# Patient Record
Sex: Female | Born: 1950 | Race: Black or African American | Hispanic: No | Smoking: Never smoker
Health system: Southern US, Community
[De-identification: ages and names within clinical notes are randomized; demographics above are authoritative.]

## PROBLEM LIST (undated history)

## (undated) ENCOUNTER — Emergency Department (HOSPITAL_BASED_OUTPATIENT_CLINIC_OR_DEPARTMENT_OTHER): Discharge status: Absent without leave | Payer: PRIVATE HEALTH INSURANCE

## (undated) DIAGNOSIS — I639 Cerebral infarction, unspecified: Secondary | ICD-10-CM

## (undated) DIAGNOSIS — H409 Unspecified glaucoma: Secondary | ICD-10-CM

## (undated) DIAGNOSIS — E079 Disorder of thyroid, unspecified: Secondary | ICD-10-CM

## (undated) HISTORY — DX: Cerebral infarction, unspecified: I63.9

## (undated) HISTORY — PX: CARPAL TUNNEL RELEASE: SHX101

## (undated) HISTORY — DX: Unspecified glaucoma: H40.9

---

## 2006-03-05 ENCOUNTER — Ambulatory Visit: Payer: Self-pay | Admitting: Hospitalist

## 2006-03-05 ENCOUNTER — Ambulatory Visit (HOSPITAL_COMMUNITY): Admission: RE | Admit: 2006-03-05 | Discharge: 2006-03-05 | Payer: Self-pay | Admitting: Hospitalist

## 2006-03-12 ENCOUNTER — Ambulatory Visit: Payer: Self-pay | Admitting: Internal Medicine

## 2006-04-03 ENCOUNTER — Ambulatory Visit (HOSPITAL_COMMUNITY): Admission: RE | Admit: 2006-04-03 | Discharge: 2006-04-03 | Payer: Self-pay | Admitting: Internal Medicine

## 2006-04-03 ENCOUNTER — Ambulatory Visit: Payer: Self-pay | Admitting: Internal Medicine

## 2006-04-07 DIAGNOSIS — K029 Dental caries, unspecified: Secondary | ICD-10-CM | POA: Insufficient documentation

## 2006-04-07 DIAGNOSIS — Z9079 Acquired absence of other genital organ(s): Secondary | ICD-10-CM | POA: Insufficient documentation

## 2006-04-07 DIAGNOSIS — Z9889 Other specified postprocedural states: Secondary | ICD-10-CM | POA: Insufficient documentation

## 2006-04-07 DIAGNOSIS — E039 Hypothyroidism, unspecified: Secondary | ICD-10-CM | POA: Insufficient documentation

## 2006-08-26 ENCOUNTER — Encounter: Payer: Self-pay | Admitting: Internal Medicine

## 2006-11-08 ENCOUNTER — Emergency Department (HOSPITAL_COMMUNITY): Admission: EM | Admit: 2006-11-08 | Discharge: 2006-11-08 | Payer: Self-pay | Admitting: Emergency Medicine

## 2009-10-01 ENCOUNTER — Emergency Department (HOSPITAL_COMMUNITY): Admission: EM | Admit: 2009-10-01 | Discharge: 2009-10-01 | Payer: Self-pay | Admitting: Family Medicine

## 2010-05-17 DEATH — deceased

## 2012-08-18 ENCOUNTER — Encounter (HOSPITAL_COMMUNITY): Payer: Self-pay | Admitting: Radiology

## 2012-08-18 ENCOUNTER — Emergency Department (HOSPITAL_COMMUNITY)
Admission: EM | Admit: 2012-08-18 | Discharge: 2012-08-18 | Disposition: A | Discharge status: Home / Self Care | Attending: Emergency Medicine | Admitting: Emergency Medicine

## 2012-08-18 DIAGNOSIS — E079 Disorder of thyroid, unspecified: Secondary | ICD-10-CM | POA: Insufficient documentation

## 2012-08-18 DIAGNOSIS — M79609 Pain in unspecified limb: Secondary | ICD-10-CM | POA: Insufficient documentation

## 2012-08-18 DIAGNOSIS — M79671 Pain in right foot: Secondary | ICD-10-CM

## 2012-08-18 HISTORY — DX: Disorder of thyroid, unspecified: E07.9

## 2012-08-18 MED ORDER — IBUPROFEN 400 MG PO TABS
600.0000 mg | ORAL_TABLET | Freq: Once | ORAL | Status: AC
  Administered 2012-08-18: 600 mg via ORAL
  Filled 2012-08-18: qty 2

## 2012-08-18 MED ORDER — IBUPROFEN 600 MG PO TABS
600.0000 mg | ORAL_TABLET | Freq: Four times a day (QID) | ORAL | Status: DC | PRN
Start: 2012-08-18 — End: 2017-03-13

## 2012-08-18 NOTE — ED Provider Notes (Signed)
Medical screening examination/treatment/procedure(s) were performed by non-physician practitioner and as supervising physician I was immediately available for consultation/collaboration.  Tobin Chad, MD 08/18/12 [phone]

## 2012-08-18 NOTE — Progress Notes (Signed)
Orthopedic Tech Progress Note Patient Details:  Dawn Carlson [birth year] [id number] Ankle ASO brace applied to Right ankle. Tolerated well. instructions given.  Ortho Devices Type of Ortho Device: ASO Ortho Device/Splint Location: Right Ortho Device/Splint Interventions: Application   [name] 08/18/2012, 8:13 AM

## 2012-08-18 NOTE — ED Notes (Signed)
Pt presents with right ankle pain without injust X friday

## 2012-08-18 NOTE — ED Provider Notes (Signed)
History     CSN: [id number]  Arrival date & time 08/18/12  0703   First MD Initiated Contact with Patient 08/18/12 [phone]      Chief Complaint  Patient presents with  . 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 The pain is in the back of her foot and on her right heel. Pain is worse with walking and pushing on the brake pedal of her car. Onset of pain was gradual. She has not had any treatments other than ice pack prior to arrival. She denies redness, infection. No history of diabetes. No weakness. Patient is currently on no medications. No back pain. Course is constant. Nothing makes symptoms worse. Rest makes the symptoms better  Patient is a 62 y.o. female presenting with ankle pain. The history is provided by the patient.  Ankle Pain Associated symptoms: no back pain, no fever and no neck pain     Past Medical History  Diagnosis Date  . Thyroid disease     Past Surgical History  Procedure Laterality Date  . Carpal tunnel release      History reviewed. No pertinent family history.  History  Substance Use Topics  . Smoking status: Not on file  . Smokeless tobacco: Not on file  . Alcohol Use: Not on file    OB History   Grav Para Term Preterm Abortions TAB SAB Ect Mult Living                  Review of Systems  Constitutional: Negative for fever and activity change.  HENT: Negative for neck pain.   Gastrointestinal:       Negative for hematemesis  Musculoskeletal: Positive for arthralgias. Negative for back pain and joint swelling.  Skin: Negative for wound.  Neurological: Negative for weakness and numbness.    Allergies  Review of patient's allergies indicates no known allergies.  Home Medications   Current Outpatient Rx    Name  Route  Sig  Dispense  Refill  . ibuprofen (ADVIL,MOTRIN) 200 MG tablet   Oral   Take 200 mg by mouth every 6 (six) hours as needed for pain.         Marland Kitchen ibuprofen (ADVIL,MOTRIN) 600 MG tablet   Oral   Take 1 tablet (600 mg total) by mouth every 6 (six) hours as needed for pain.   20 tablet   0     BP 132/82  Temp(Src) 98 F (36.7 C) (Oral)  Resp 18  SpO2 97%  Physical Exam  Nursing note and vitals reviewed. Constitutional: She appears well-developed and well-nourished.  HENT:  Head: Normocephalic and atraumatic.  Eyes: Pupils are equal, round, and reactive to light.  Neck: Normal range of motion. Neck supple.  Cardiovascular: Exam reveals no decreased pulses.   Pulses:      Dorsalis pedis pulses are 2+ on the right side, and 2+ on the left side.       Posterior tibial pulses are 2+ on the right side, and 2+ on the left side.  Musculoskeletal: She exhibits edema and tenderness.       Right ankle: She exhibits normal range of motion and no swelling. No tenderness. No head of 5th metatarsal and no proximal fibula tenderness found. Achilles tendon exhibits no pain (Mild).  Right lower leg: She exhibits no tenderness.       Right foot: She exhibits tenderness and swelling (Mild). She exhibits normal range of motion, no bony tenderness, normal capillary refill and no deformity.       Feet:  Neurological: She is alert. No sensory deficit.  Motor, sensation, and vascular distal to the injury is fully intact.   Skin: Skin is warm and dry.  Psychiatric: She has a normal mood and affect.    ED Course  Procedures (including critical care time)  Labs Reviewed - No data to display No results found.   1. Foot pain, right    7:33 AM Patient seen and examined. Medications ordered.   Vital signs reviewed and are as follows: Filed Vitals:   08/18/12 0707  BP: 132/82  Temp: 98 F (36.7 C)  Resp: 18   Patient was counseled on RICE protocol and told to rest injury,  use ice for no longer than 15 minutes every hour, compress the area, and elevate above the level of their heart as much as possible to reduce swelling.  Questions answered.  Patient verbalized understanding.    ASO by ortho tech. Ortho referral given.     MDM  Foot pain, no injury, doubt fx. X-ray deferred. Injury/inflammation likely 2/2 overuse. RICE and NSAIDs indicated. Ortho f/u given if not improving.         [place], [place] 08/18/12 [phone]

## 2012-08-18 NOTE — ED Notes (Signed)
PA at bedside.

## 2012-08-18 NOTE — ED Notes (Signed)
Paged ortho for splint at this time 

## 2012-09-13 ENCOUNTER — Encounter (HOSPITAL_COMMUNITY): Payer: Self-pay | Admitting: Emergency Medicine

## 2012-09-13 ENCOUNTER — Emergency Department (INDEPENDENT_AMBULATORY_CARE_PROVIDER_SITE_OTHER)
Admission: EM | Admit: 2012-09-13 | Discharge: 2012-09-13 | Disposition: A | Discharge status: Home / Self Care | Source: Home / Self Care

## 2012-09-13 DIAGNOSIS — M7660 Achilles tendinitis, unspecified leg: Secondary | ICD-10-CM

## 2012-09-13 DIAGNOSIS — M7661 Achilles tendinitis, right leg: Secondary | ICD-10-CM

## 2012-09-13 MED ORDER — TRAMADOL HCL 50 MG PO TABS: 50.0000 mg | ORAL_TABLET | Freq: Four times a day (QID) | ORAL | Status: DC | PRN

## 2012-09-13 NOTE — ED Notes (Signed)
Reports: right foot pain and swelling. Pain is felt at heel and ankle.  Right ankle is swollen. Pt denies injury. Pt states that she is on her feet constantly for work.  Symptoms started around 2/28 gradual on set but is gradual getting worse. Pain with flexion and extension of foot.   Pt has used ice and ibuprofen with no relief

## 2012-09-13 NOTE — ED Provider Notes (Signed)
History     CSN: [id number]  Arrival date & time 09/13/12  1340   None     Chief Complaint  Patient presents with  . Foot Pain    right foot pain and swelling.     (Consider location/radiation/quality/duration/timing/severity/associated sxs/prior treatment) HPI Comments: 62 year old female presents with right foot pain for approximately one month. She points to the posterior aspect of the ankle is the source of pain. She has no known injury. Her job includes frequent walking and prolonged standing. In further questioning the patient actually no pain in the foot. Is located the posterior ankle over the Achilles tendon.   Past Medical History  Diagnosis Date  . Thyroid disease     Past Surgical History  Procedure Laterality Date  . Carpal tunnel release      History reviewed. No pertinent family history.  History  Substance Use Topics  . Smoking status: Never Smoker   . Smokeless tobacco: Not on file  . Alcohol Use: No    OB History   Grav Para Term Preterm Abortions TAB SAB Ect Mult Living                  Review of Systems  Constitutional: Negative for fever, chills and activity change.  HENT: Negative.   Respiratory: Negative.   Cardiovascular: Negative.   Musculoskeletal:       As per HPI  Skin: Negative for color change, pallor and rash.  Neurological: Negative.     Allergies  Review of patient's allergies indicates no known allergies.  Home Medications   Current Outpatient Rx  Name  Route  Sig  Dispense  Refill  . ibuprofen (ADVIL,MOTRIN) 200 MG tablet   Oral   Take 200 mg by mouth every 6 (six) hours as needed for pain.         Marland Kitchen ibuprofen (ADVIL,MOTRIN) 600 MG tablet   Oral   Take 1 tablet (600 mg total) by mouth every 6 (six) hours as needed for pain.   20 tablet   0   . traMADol (ULTRAM) 50 MG tablet   Oral   Take 1 tablet (50 mg total) by mouth every 6 (six) hours as needed for pain.   15 tablet   0     BP 136/85  Pulse 76   Temp(Src) 97.8 F (36.6 C) (Oral)  Resp 16  SpO2 97%  Physical Exam  Nursing note and vitals reviewed. Constitutional: She is oriented to person, place, and time. She appears well-developed and well-nourished. No distress.  HENT:  Head: Normocephalic and atraumatic.  Eyes: EOM are normal. Pupils are equal, round, and reactive to light.  Pulmonary/Chest: Effort normal.  Musculoskeletal:  Tenderness along the posterior ankle directly over the Achilles tendon. There is mild swelling of the right malleolus. No bony tenderness. Pain is reproduced with dorsiflexion. No tenderness, swelling or deformity of the foot or toes. Full range of motion the ankle but associated with pain. No pulses 2+  Neurological: She is alert and oriented to person, place, and time. No cranial nerve deficit.  Skin: Skin is warm and dry.  Psychiatric: She has a normal mood and affect.    ED Course  Procedures (including critical care time)  Labs Reviewed - No data to display No results found.   1. Achilles tendonitis, right       MDM  Apply ASO. Continue taking ibuprofen as directed and applying ice. Keep your appointment with the doctor this coming Friday. Differential  may include gout however no tenderness  over the ankle joint, no erythema, increased warmth or history of gout. No bony tenderness, doubt fracture, suspect overuse type injury.   Hayden Rasmussen, NP 09/13/12 1517

## 2012-09-13 NOTE — ED Notes (Signed)
Upon entering room to apply ASO patient states she has one at home from visit to the ER in [place].  Provider made aware

## 2012-09-17 NOTE — ED Provider Notes (Signed)
Medical screening examination/treatment/procedure(s) were performed by resident physician or non-physician practitioner and as supervising physician I was immediately available for consultation/collaboration.   KINDL,JAMES DOUGLAS MD.   James D Kindl, MD 09/17/12 1938 

## 2013-04-05 ENCOUNTER — Emergency Department (HOSPITAL_COMMUNITY)
Admission: EM | Admit: 2013-04-05 | Discharge: 2013-04-05 | Disposition: A | Discharge status: Home / Self Care | Payer: 59 | Source: Home / Self Care | Attending: Family Medicine | Admitting: Family Medicine

## 2013-04-05 ENCOUNTER — Encounter (HOSPITAL_COMMUNITY): Payer: Self-pay | Admitting: Emergency Medicine

## 2013-04-05 DIAGNOSIS — K029 Dental caries, unspecified: Secondary | ICD-10-CM

## 2013-04-05 MED ORDER — CLINDAMYCIN HCL 150 MG PO CAPS: 150.0000 mg | ORAL_CAPSULE | Freq: Three times a day (TID) | ORAL | Status: DC

## 2013-04-05 MED ORDER — DICLOFENAC POTASSIUM 50 MG PO TABS: 50.0000 mg | ORAL_TABLET | Freq: Three times a day (TID) | ORAL | Status: DC

## 2013-04-05 NOTE — ED Notes (Signed)
C/o dental pain

## 2013-04-05 NOTE — ED Provider Notes (Signed)
CSN: [id number]     Arrival date & time 04/05/13  1811 History   First MD Initiated Contact with Patient 04/05/13 1946     Chief Complaint  Patient presents with  . Dental Pain   (Consider location/radiation/quality/duration/timing/severity/associated sxs/prior Treatment) Patient is a 62 y.o. female presenting with tooth pain. The history is provided by the patient.  Dental Pain Location:  Lower Lower teeth location:  20/LL 2nd bicuspid Quality:  Throbbing Severity:  Moderate Duration:  1 day Progression:  Worsening Chronicity:  New Context: dental caries     Past Medical History  Diagnosis Date  . Thyroid disease    Past Surgical History  Procedure Laterality Date  . Carpal tunnel release     History reviewed. No pertinent family history. History  Substance Use Topics  . Smoking status: Never Smoker   . Smokeless tobacco: Not on file  . Alcohol Use: No   OB History   Grav Para Term Preterm Abortions TAB SAB Ect Mult Living                 Review of Systems  Constitutional: Negative.   HENT: Positive for dental problem.     Allergies  Review of patient's allergies indicates no known allergies.  Home Medications   Current Outpatient Rx  Name  Route  Sig  Dispense  Refill  . clindamycin (CLEOCIN) 150 MG capsule   Oral   Take 1 capsule (150 mg total) by mouth 3 (three) times daily.   21 capsule   0   . diclofenac (CATAFLAM) 50 MG tablet   Oral   Take 1 tablet (50 mg total) by mouth 3 (three) times daily. For dental pain   15 tablet   0   . ibuprofen (ADVIL,MOTRIN) 200 MG tablet   Oral   Take 200 mg by mouth every 6 (six) hours as needed for pain.         Marland Kitchen ibuprofen (ADVIL,MOTRIN) 600 MG tablet   Oral   Take 1 tablet (600 mg total) by mouth every 6 (six) hours as needed for pain.   20 tablet   0   . traMADol (ULTRAM) 50 MG tablet   Oral   Take 1 tablet (50 mg total) by mouth every 6 (six) hours as needed for pain.   15 tablet   0    BP  157/87  Pulse 75  Temp(Src) 98.2 F (36.8 C) (Oral)  Resp 18  SpO2 98% Physical Exam  Nursing note and vitals reviewed. Constitutional: She appears well-developed and well-nourished. She appears distressed.  HENT:  Right Ear: External ear normal.  Left Ear: External ear normal.  Mouth/Throat: Oropharynx is clear and moist and mucous membranes are normal. Abnormal dentition. Dental caries present.      ED Course  Procedures (including critical care time) Labs Review Labs Reviewed - No data to display Imaging Review No results found.    MDM      Linna Hoff, MD 04/05/13 [phone]

## 2014-11-12 ENCOUNTER — Emergency Department (INDEPENDENT_AMBULATORY_CARE_PROVIDER_SITE_OTHER)
Admission: EM | Admit: 2014-11-12 | Discharge: 2014-11-12 | Disposition: A | Discharge status: Home / Self Care | Payer: 59 | Source: Home / Self Care | Attending: Family Medicine | Admitting: Family Medicine

## 2014-11-12 ENCOUNTER — Encounter (HOSPITAL_COMMUNITY): Payer: Self-pay

## 2014-11-12 DIAGNOSIS — J9801 Acute bronchospasm: Secondary | ICD-10-CM

## 2014-11-12 DIAGNOSIS — J040 Acute laryngitis: Secondary | ICD-10-CM

## 2014-11-12 DIAGNOSIS — J301 Allergic rhinitis due to pollen: Secondary | ICD-10-CM

## 2014-11-12 DIAGNOSIS — R0982 Postnasal drip: Secondary | ICD-10-CM

## 2014-11-12 DIAGNOSIS — S161XXA Strain of muscle, fascia and tendon at neck level, initial encounter: Secondary | ICD-10-CM

## 2014-11-12 MED ORDER — PREDNISONE 20 MG PO TABS: ORAL_TABLET | ORAL | Status: DC

## 2014-11-12 MED ORDER — ALBUTEROL SULFATE HFA 108 (90 BASE) MCG/ACT IN AERS: 2.0000 | INHALATION_SPRAY | RESPIRATORY_TRACT | Status: DC | PRN

## 2014-11-12 NOTE — ED Provider Notes (Signed)
CSN: [id number]     Arrival date & time 11/12/14  0901 History   First MD Initiated Contact with Patient 11/12/14 [phone]     Chief Complaint  Patient presents with  . Generalized Body Aches   (Consider location/radiation/quality/duration/timing/severity/associated sxs/prior Treatment) HPI Comments: 64 year old female complaining of loss of voice, laryngitis, dry cough, chest discomfort when taking a deep breath or coughing, sore throat and lateral neck muscle pain for at least 2 days. Denies fever or earache. Also complains of pain in the right neck and shoulder. She points to the musculature behind the right ear and right lateral neck. It is worse with turning her head and other similar movements. No known injury or calls.   Past Medical History  Diagnosis Date  . Thyroid disease    Past Surgical History  Procedure Laterality Date  . Carpal tunnel release     No family history on file. History  Substance Use Topics  . Smoking status: Never Smoker   . Smokeless tobacco: Not on file  . Alcohol Use: No   OB History    No data available     Review of Systems  Constitutional: Negative for fever, chills, activity change, appetite change and fatigue.  HENT: Positive for congestion, postnasal drip, sore throat and voice change. Negative for ear discharge, facial swelling and rhinorrhea.   Eyes: Negative.   Respiratory: Positive for cough. Negative for chest tightness and shortness of breath.   Cardiovascular: Negative.   Gastrointestinal: Negative.   Musculoskeletal: Positive for neck pain. Negative for neck stiffness.  Skin: Negative.  Negative for pallor and rash.  Neurological: Negative.     Allergies  Review of patient's allergies indicates no known allergies.  Home Medications   Prior to Admission medications   Medication Sig Start Date End Date Taking? Authorizing Provider  albuterol (PROVENTIL HFA;VENTOLIN HFA) 108 (90 BASE) MCG/ACT inhaler Inhale 2 puffs into the  lungs every 4 (four) hours as needed for wheezing or shortness of breath. 11/12/14   Hayden Rasmussen, NP  clindamycin (CLEOCIN) 150 MG capsule Take 1 capsule (150 mg total) by mouth 3 (three) times daily. 04/05/13   Linna Hoff, MD  diclofenac (CATAFLAM) 50 MG tablet Take 1 tablet (50 mg total) by mouth 3 (three) times daily. For dental pain 04/05/13   Linna Hoff, MD  ibuprofen (ADVIL,MOTRIN) 200 MG tablet Take 200 mg by mouth every 6 (six) hours as needed for pain.    Historical Provider, MD  ibuprofen (ADVIL,MOTRIN) 600 MG tablet Take 1 tablet (600 mg total) by mouth every 6 (six) hours as needed for pain. 08/18/12   Renne Crigler, PA-C  predniSONE (DELTASONE) 20 MG tablet Take 3 tabs po on first day, 2 tabs second day, 2 tabs third day, 1 tab fourth day, 1 tab 5th day. Take with food. 11/12/14   Hayden Rasmussen, NP  traMADol (ULTRAM) 50 MG tablet Take 1 tablet (50 mg total) by mouth every 6 (six) hours as needed for pain. 09/13/12   Hayden Rasmussen, NP   BP 125/80 mmHg  Pulse 64  Temp(Src) 98.1 F (36.7 C) (Oral)  Resp 16  SpO2 97% Physical Exam  Constitutional: She is oriented to person, place, and time. She appears well-developed and well-nourished. No distress.  HENT:  Head: Normocephalic and atraumatic.  Bilateral TMs are normal Oropharynx with minor erythema, cobblestoning and scant clear PND.  Eyes: Conjunctivae and EOM are normal.  Neck: Normal range of motion. Neck supple.  Tenderness along the  ridge of the right trapezius muscle as well as the insertion site behind the right ear.  Cardiovascular: Normal rate, regular rhythm and normal heart sounds.   Pulmonary/Chest: Effort normal and breath sounds normal. No respiratory distress.  No wheezes normal respiration. Good air movement. When having patient to forcibly cough there is  wheezing  auscultated at that time.  Musculoskeletal: Normal range of motion. She exhibits no edema.  Tenderness to the right trapezius ridge and insertion to the  scalp behind the ear.  Lymphadenopathy:    She has no cervical adenopathy.  Neurological: She is alert and oriented to person, place, and time.  Skin: Skin is warm and dry. No rash noted.  Psychiatric: She has a normal mood and affect.  Nursing note and vitals reviewed.   ED Course  Procedures (including critical care time) Labs Review Labs Reviewed - No data to display  Imaging Review No results found.   MDM   1. Allergic rhinitis due to pollen   2. PND (post-nasal drip)   3. Laryngitis, acute   4. Acute bronchospasm   5. Neck muscle strain, initial encounter    Allegra daily, lots of fluids Flonase nasal spray Albuterol HFA as directed for wheeze and cough Prednisone taper dose as directed Heat to neck and stretches     Hayden Rasmussenavid Shanoah Asbill, NP 11/12/14 [phone]

## 2014-11-12 NOTE — ED Notes (Signed)
C/o cough ( non productive), HA, body stiffness x 2 days

## 2014-11-12 NOTE — Discharge Instructions (Signed)
Allergic Rhinitis Allegra daily, lots of fluids Flonase nasal spray Allergic rhinitis is when the mucous membranes in the nose respond to allergens. Allergens are particles in the air that cause your body to have an allergic reaction. This causes you to release allergic antibodies. Through a chain of events, these eventually cause you to release histamine into the blood stream. Although meant to protect the body, it is this release of histamine that causes your discomfort, such as frequent sneezing, congestion, and an itchy, runny nose.  CAUSES  Seasonal allergic rhinitis (hay fever) is caused by pollen allergens that may come from grasses, trees, and weeds. Year-round allergic rhinitis (perennial allergic rhinitis) is caused by allergens such as house dust mites, pet dander, and mold spores.  SYMPTOMS  1. Nasal stuffiness (congestion). 2. Itchy, runny nose with sneezing and tearing of the eyes. DIAGNOSIS  Your health care provider can help you determine the allergen or allergens that trigger your symptoms. If you and your health care provider are unable to determine the allergen, skin or blood testing may be used. TREATMENT  Allergic rhinitis does not have a cure, but it can be controlled by:  Medicines and allergy shots (immunotherapy).  Avoiding the allergen. Hay fever may often be treated with antihistamines in pill or nasal spray forms. Antihistamines block the effects of histamine. There are over-the-counter medicines that may help with nasal congestion and swelling around the eyes. Check with your health care provider before taking or giving this medicine.  If avoiding the allergen or the medicine prescribed do not work, there are many new medicines your health care provider can prescribe. Stronger medicine may be used if initial measures are ineffective. Desensitizing injections can be used if medicine and avoidance does not work. Desensitization is when a patient is given ongoing shots  until the body becomes less sensitive to the allergen. Make sure you follow up with your health care provider if problems continue. HOME CARE INSTRUCTIONS It is not possible to completely avoid allergens, but you can reduce your symptoms by taking steps to limit your exposure to them. It helps to know exactly what you are allergic to so that you can avoid your specific triggers. SEEK MEDICAL CARE IF:   You have a fever.  You develop a cough that does not stop easily (persistent).  You have shortness of breath.  You start wheezing.  Symptoms interfere with normal daily activities. Document Released: 02/26/2001 Document Revised: 06/08/2013 Document Reviewed: 02/08/2013 [facility] Patient Information 2015 [place], [place]. This information is not intended to replace advice given to you by your health care provider. Make sure you discuss any questions you have with your health care provider.  Bronchospasm Albuterol HFA as directed for wheeze and cough Prednisone taper dose as directed A bronchospasm is a spasm or tightening of the airways going into the lungs. During a bronchospasm breathing becomes more difficult because the airways get smaller. When this happens there can be coughing, a whistling sound when breathing (wheezing), and difficulty breathing. Bronchospasm is often associated with asthma, but not all patients who experience a bronchospasm have asthma. CAUSES  A bronchospasm is caused by inflammation or irritation of the airways. The inflammation or irritation may be triggered by:  3. Allergies (such as to animals, pollen, food, or mold). Allergens that cause bronchospasm may cause wheezing immediately after exposure or many hours later.  4. Infection. Viral infections are believed to be the most common cause of bronchospasm.  5. Exercise.  6. Irritants (such  as pollution, cigarette smoke, strong odors, aerosol sprays, and paint fumes).  7. Weather changes. Winds increase molds  and pollens in the air. Rain refreshes the air by washing irritants out. Cold air may cause inflammation.  8. Stress and emotional upset.  SIGNS AND SYMPTOMS   Wheezing.   Excessive nighttime coughing.   Frequent or severe coughing with a simple cold.   Chest tightness.   Shortness of breath.  DIAGNOSIS  Bronchospasm is usually diagnosed through a history and physical exam. Tests, such as chest X-rays, are sometimes done to look for other conditions. TREATMENT   Inhaled medicines can be given to open up your airways and help you breathe. The medicines can be given using either an inhaler or a nebulizer machine.  Corticosteroid medicines may be given for severe bronchospasm, usually when it is associated with asthma. HOME CARE INSTRUCTIONS   Always have a plan prepared for seeking medical care. Know when to call your health care provider and local emergency services (911 in the [place] Know where you can access local emergency care.  Only take medicines as directed by your health care provider.  If you were prescribed an inhaler or nebulizer machine, ask your health care provider to explain how to use it correctly. Always use a spacer with your inhaler if you were given one.  It is necessary to remain calm during an attack. Try to relax and breathe more slowly.  Control your home environment in the following ways:   Change your heating and air conditioning filter at least once a month.   Limit your use of fireplaces and wood stoves.  Do not smoke and do not allow smoking in your home.   Avoid exposure to perfumes and fragrances.   Get rid of pests (such as roaches and mice) and their droppings.   Throw away plants if you see mold on them.   Keep your house clean and dust free.   Replace carpet with wood, tile, or vinyl flooring. Carpet can trap dander and dust.   Use allergy-proof pillows, mattress covers, and box spring covers.   Wash bed sheets and  blankets every week in hot water and dry them in a dryer.   Use blankets that are made of polyester or cotton.   Wash hands frequently. SEEK MEDICAL CARE IF:   You have muscle aches.   You have chest pain.   The sputum changes from clear or white to yellow, green, gray, or bloody.   The sputum you cough up gets thicker.   There are problems that may be related to the medicine you are given, such as a rash, itching, swelling, or trouble breathing.  SEEK IMMEDIATE MEDICAL CARE IF:   You have worsening wheezing and coughing even after taking your prescribed medicines.   You have increased difficulty breathing.   You develop severe chest pain. MAKE SURE YOU:   Understand these instructions.  Will watch your condition.  Will get help right away if you are not doing well or get worse. Document Released: 06/06/2003 Document Revised: 06/08/2013 Document Reviewed: 11/23/2012 [facility] Patient Information 2015 [place], [place]. This information is not intended to replace advice given to you by your health care provider. Make sure you discuss any questions you have with your health care provider.  How to Use an Inhaler Using your inhaler correctly is very important. Good technique will make sure that the medicine reaches your lungs.  HOW TO USE AN INHALER: 9. Take the cap off  the inhaler. 10. If this is the first time using your inhaler, you need to prime it. Shake the inhaler for 5 seconds. Release four puffs into the air, away from your face. Ask your doctor for help if you have questions. 11. Shake the inhaler for 5 seconds. 12. Turn the inhaler so the bottle is above the mouthpiece. 13. Put your pointer finger on top of the bottle. Your thumb holds the bottom of the inhaler. 14. Open your mouth. 15. Either hold the inhaler away from your mouth (the width of 2 fingers) or place your lips tightly around the mouthpiece. Ask your doctor which way to use your  inhaler. 16. Breathe out as much air as possible. 17. Breathe in and push down on the bottle 1 time to release the medicine. You will feel the medicine go in your mouth and throat. 18. Continue to take a deep breath in very slowly. Try to fill your lungs. 19. After you have breathed in completely, hold your breath for 10 seconds. This will help the medicine to settle in your lungs. If you cannot hold your breath for 10 seconds, hold it for as long as you can before you breathe out. 20. Breathe out slowly, through pursed lips. Whistling is an example of pursed lips. 21. If your doctor has told you to take more than 1 puff, wait at least 15-30 seconds between puffs. This will help you get the best results from your medicine. Do not use the inhaler more than your doctor tells you to. 22. Put the cap back on the inhaler. 23. Follow the directions from your doctor or from the inhaler package about cleaning the inhaler. If you use more than one inhaler, ask your doctor which inhalers to use and what order to use them in. Ask your doctor to help you figure out when you will need to refill your inhaler.  If you use a steroid inhaler, always rinse your mouth with water after your last puff, gargle and spit out the water. Do not swallow the water. GET HELP IF:  The inhaler medicine only partially helps to stop wheezing or shortness of breath.  You are having trouble using your inhaler.  You have some increase in thick spit (phlegm). GET HELP RIGHT AWAY IF:  The inhaler medicine does not help your wheezing or shortness of breath or you have tightness in your chest.  You have dizziness, headaches, or fast heart rate.  You have chills, fever, or night sweats.  You have a large increase of thick spit, or your thick spit is bloody. MAKE SURE YOU:   Understand these instructions.  Will watch your condition.  Will get help right away if you are not doing well or get worse. Document Released:  03/12/2008 Document Revised: 03/24/2013 Document Reviewed: 12/31/2012 [facility] Patient Information 2015 [place], [place]. This information is not intended to replace advice given to you by your health care provider. Make sure you discuss any questions you have with your health care provider.

## 2014-11-15 ENCOUNTER — Encounter (HOSPITAL_COMMUNITY): Payer: Self-pay | Admitting: Family Medicine

## 2014-11-15 ENCOUNTER — Emergency Department (INDEPENDENT_AMBULATORY_CARE_PROVIDER_SITE_OTHER)
Admission: EM | Admit: 2014-11-15 | Discharge: 2014-11-15 | Disposition: A | Discharge status: Home / Self Care | Payer: 59 | Source: Home / Self Care | Attending: Family Medicine | Admitting: Family Medicine

## 2014-11-15 DIAGNOSIS — H8111 Benign paroxysmal vertigo, right ear: Secondary | ICD-10-CM | POA: Diagnosis not present

## 2014-11-15 MED ORDER — MECLIZINE HCL 50 MG PO TABS: 25.0000 mg | ORAL_TABLET | Freq: Three times a day (TID) | ORAL | Status: DC | PRN

## 2014-11-15 NOTE — Discharge Instructions (Signed)
Likely have a condition called BPV. This may be from an inner ear Crystal is coming loose causing your bowel system to malfunction. Please use the Antivert for your symptoms and consider trying Epley's maneuvers. Please continue your other medications and her daily exercises.   Epley Maneuver Self-Care WHAT IS THE EPLEY MANEUVER? The Epley maneuver is an exercise you can do to relieve symptoms of benign paroxysmal positional vertigo (BPPV). This condition is often just referred to as vertigo. BPPV is caused by the movement of tiny crystals (canaliths) inside your inner ear. The accumulation and movement of canaliths in your inner ear causes a sudden spinning sensation (vertigo) when you move your head to certain positions. Vertigo usually lasts about 30 seconds. BPPV usually occurs in just one ear. If you get vertigo when you lie on your left side, you probably have BPPV in your left ear. Your health care provider can tell you which ear is involved.  BPPV may be caused by a head injury. Many people older than 50 get BPPV for unknown reasons. If you have been diagnosed with BPPV, your health care provider may teach you how to do this maneuver. BPPV is not life threatening (benign) and usually goes away in time.  WHEN SHOULD I PERFORM THE EPLEY MANEUVER? You can do this maneuver at home whenever you have symptoms of vertigo. You may do the Epley maneuver up to 3 times a day until your symptoms of vertigo go away. HOW SHOULD I DO THE EPLEY MANEUVER?  Sit on the edge of a bed or table with your back straight. Your legs should be extended or hanging over the edge of the bed or table.   Turn your head halfway toward the affected ear.   Lie backward quickly with your head turned until you are lying flat on your back. You may want to position a pillow under your shoulders.   Hold this position for 30 seconds. You may experience an attack of vertigo. This is normal. Hold this position until the vertigo  stops.  Then turn your head to the opposite direction until your unaffected ear is facing the floor.   Hold this position for 30 seconds. You may experience an attack of vertigo. This is normal. Hold this position until the vertigo stops.  Now turn your whole body to the same side as your head. Hold for another 30 seconds.   You can then sit back up. ARE THERE RISKS TO THIS MANEUVER? In some cases, you may have other symptoms (such as changes in your vision, weakness, or numbness). If you have these symptoms, stop doing the maneuver and call your health care provider. Even if doing these maneuvers relieves your vertigo, you may still have dizziness. Dizziness is the sensation of light-headedness but without the sensation of movement. Even though the Epley maneuver may relieve your vertigo, it is possible that your symptoms will return within 5 years. WHAT SHOULD I DO AFTER THIS MANEUVER? After doing the Epley maneuver, you can return to your normal activities. Ask your doctor if there is anything you should do at home to prevent vertigo. This may include:  Sleeping with two or more pillows to keep your head elevated.  Not sleeping on the side of your affected ear.  Getting up slowly from bed.  Avoiding sudden movements during the day.  Avoiding extreme head movement, like looking up or bending over.  Wearing a cervical collar to prevent sudden head movements. WHAT SHOULD I DO IF  MY SYMPTOMS GET WORSE? Call your health care provider if your vertigo gets worse. Call your provider right way if you have other symptoms, including:   Nausea.  Vomiting.  Headache.  Weakness.  Numbness.  Vision changes. Document Released: 06/08/2013 Document Reviewed: 06/08/2013 [facility] Patient Information 2015 [place], [place]. This information is not intended to replace advice given to you by your health care provider. Make sure you discuss any questions you have with your health care  provider.  Benign Positional Vertigo Vertigo means you feel like you or your surroundings are moving when they are not. Benign positional vertigo is the most common form of vertigo. Benign means that the cause of your condition is not serious. Benign positional vertigo is more common in older adults. CAUSES  Benign positional vertigo is the result of an upset in the labyrinth system. This is an area in the middle ear that helps control your balance. This may be caused by a viral infection, head injury, or repetitive motion. However, often no specific cause is found. SYMPTOMS  Symptoms of benign positional vertigo occur when you move your head or eyes in different directions. Some of the symptoms may include:  Loss of balance and falls.  Vomiting.  Blurred vision.  Dizziness.  Nausea.  Involuntary eye movements (nystagmus). DIAGNOSIS  Benign positional vertigo is usually diagnosed by physical exam. If the specific cause of your benign positional vertigo is unknown, your caregiver may perform imaging tests, such as magnetic resonance imaging (MRI) or computed tomography (CT). TREATMENT  Your caregiver may recommend movements or procedures to correct the benign positional vertigo. Medicines such as meclizine, benzodiazepines, and medicines for nausea may be used to treat your symptoms. In rare cases, if your symptoms are caused by certain conditions that affect the inner ear, you may need surgery. HOME CARE INSTRUCTIONS   Follow your caregiver's instructions.  Move slowly. Do not make sudden body or head movements.  Avoid driving.  Avoid operating heavy machinery.  Avoid performing any tasks that would be dangerous to you or others during a vertigo episode.  Drink enough fluids to keep your urine clear or pale yellow. SEEK IMMEDIATE MEDICAL CARE IF:   You develop problems with walking, weakness, numbness, or using your arms, hands, or legs.  You have difficulty speaking.  You  develop severe headaches.  Your nausea or vomiting continues or gets worse.  You develop visual changes.  Your family or friends notice any behavioral changes.  Your condition gets worse.  You have a fever.  You develop a stiff neck or sensitivity to light. MAKE SURE YOU:   Understand these instructions.  Will watch your condition.  Will get help right away if you are not doing well or get worse. Document Released: 03/11/2006 Document Revised: 08/26/2011 Document Reviewed: 02/21/2011 [facility] Patient Information 2015 [place], [place]. This information is not intended to replace advice given to you by your health care provider. Make sure you discuss any questions you have with your health care provider.

## 2014-11-15 NOTE — ED Notes (Addendum)
Patient reports she feels weak and dizzy x 5 days. Patient is in NAD.

## 2014-11-15 NOTE — ED Provider Notes (Signed)
CSN: [id number]     Arrival date & time 11/15/14  1719 History   First MD Initiated Contact with Patient 11/15/14 1753     Chief Complaint  Patient presents with  . Dizziness   (Consider location/radiation/quality/duration/timing/severity/associated sxs/prior Treatment) HPI  Seen on 5/28 at [facility]. Note reviewed in full by provider Hayden Rasmussen.  Since that time. Muscle spasm and laryngitis are improving.   Dizziness ongoing for 5 days. Described as the room spinning. Typically when going from sitting to standing or with quick head movements. No nausea. Improves w/ prolonged resting w/o movement.   Denies fevers, chest pain, shortness breath, palpitations, LOC, rash, vomiting, dysuria, frequency.   Past Medical History  Diagnosis Date  . Thyroid disease    Past Surgical History  Procedure Laterality Date  . Carpal tunnel release     Family History  Problem Relation Age of Onset  . Diabetes Other    History  Substance Use Topics  . Smoking status: Never Smoker   . Smokeless tobacco: Not on file  . Alcohol Use: No   OB History    No data available     Review of Systems Per HPI with all other pertinent systems negative.   Allergies  Review of patient's allergies indicates no known allergies.  Home Medications   Prior to Admission medications   Medication Sig Start Date End Date Taking? Authorizing Provider  albuterol (PROVENTIL HFA;VENTOLIN HFA) 108 (90 BASE) MCG/ACT inhaler Inhale 2 puffs into the lungs every 4 (four) hours as needed for wheezing or shortness of breath. 11/12/14   Hayden Rasmussen, NP  clindamycin (CLEOCIN) 150 MG capsule Take 1 capsule (150 mg total) by mouth 3 (three) times daily. 04/05/13   Linna Hoff, MD  diclofenac (CATAFLAM) 50 MG tablet Take 1 tablet (50 mg total) by mouth 3 (three) times daily. For dental pain 04/05/13   Linna Hoff, MD  ibuprofen (ADVIL,MOTRIN) 200 MG tablet Take 200 mg by mouth every 6 (six) hours as needed for pain.     Historical Provider, MD  ibuprofen (ADVIL,MOTRIN) 600 MG tablet Take 1 tablet (600 mg total) by mouth every 6 (six) hours as needed for pain. 08/18/12   Renne Crigler, PA-C  meclizine (ANTIVERT) 50 MG tablet Take 0.5 tablets (25 mg total) by mouth 3 (three) times daily as needed. 11/15/14   Ozella Rocks, MD  predniSONE (DELTASONE) 20 MG tablet Take 3 tabs po on first day, 2 tabs second day, 2 tabs third day, 1 tab fourth day, 1 tab 5th day. Take with food. 11/12/14   Hayden Rasmussen, NP  traMADol (ULTRAM) 50 MG tablet Take 1 tablet (50 mg total) by mouth every 6 (six) hours as needed for pain. 09/13/12   Hayden Rasmussen, NP   BP 135/89 mmHg  Pulse 70  Temp(Src) 98.9 F (37.2 C) (Oral)  Resp 18  SpO2 98% Physical Exam Physical Exam  Constitutional: oriented to person, place, and time. appears well-developed and well-nourished. No distress.  HENT:  Head: Normocephalic and atraumatic.  Eyes: EOMI. PERRL.  Neck: Normal range of motion.  Cardiovascular: RRR, no m/r/g, 2+ distal pulses,  Pulmonary/Chest: Effort normal and breath sounds normal. No respiratory distress.  Abdominal: Soft. Bowel sounds are normal. NonTTP, no distension.  Musculoskeletal: Normal range of motion. Non ttp, no effusion.  Neurological: The first 2 through 12 intact, moves extremities and coordinated fashion, Dix-Hallpike equivocal on the left but positive on right with horizontal nystagmus and onset of symptoms.Marland Kitchen  Skin: Skin is warm. No rash noted. non diaphoretic.  Psychiatric: normal mood and affect. behavior is normal. Judgment and thought content normal.    ED Course  Procedures (including critical care time) Labs Review Labs Reviewed - No data to display  Imaging Review No results found.   MDM   1. BPV (benign positional vertigo), right    Start Antivert and Epley's maneuvers. With ENT if needed  Laryngitis improving, continue steroid Dosepak  Specimen improving, continue with exercises    David J MerrOzella Rocksell,  MD 11/15/14 [phone]

## 2015-08-02 ENCOUNTER — Other Ambulatory Visit: Payer: Self-pay | Admitting: Physician Assistant

## 2015-08-02 DIAGNOSIS — Z1231 Encounter for screening mammogram for malignant neoplasm of breast: Secondary | ICD-10-CM

## 2015-08-22 ENCOUNTER — Ambulatory Visit: Payer: Self-pay

## 2015-10-27 ENCOUNTER — Ambulatory Visit: Payer: Self-pay

## 2015-12-26 ENCOUNTER — Ambulatory Visit
Admission: RE | Admit: 2015-12-26 | Discharge: 2015-12-26 | Disposition: A | Discharge status: Home / Self Care | Payer: 59 | Source: Ambulatory Visit | Attending: Physician Assistant | Admitting: Physician Assistant

## 2015-12-26 DIAGNOSIS — Z1231 Encounter for screening mammogram for malignant neoplasm of breast: Secondary | ICD-10-CM

## 2016-01-01 LAB — HM COLONOSCOPY

## 2017-03-13 ENCOUNTER — Emergency Department (HOSPITAL_COMMUNITY)
Admission: EM | Admit: 2017-03-13 | Discharge: 2017-03-13 | Disposition: A | Discharge status: Home / Self Care | Attending: Emergency Medicine | Admitting: Emergency Medicine

## 2017-03-13 ENCOUNTER — Encounter (HOSPITAL_COMMUNITY): Payer: Self-pay | Admitting: Emergency Medicine

## 2017-03-13 DIAGNOSIS — Y92414 Local residential or business street as the place of occurrence of the external cause: Secondary | ICD-10-CM | POA: Diagnosis not present

## 2017-03-13 DIAGNOSIS — Z79899 Other long term (current) drug therapy: Secondary | ICD-10-CM | POA: Diagnosis not present

## 2017-03-13 DIAGNOSIS — S46212A Strain of muscle, fascia and tendon of other parts of biceps, left arm, initial encounter: Secondary | ICD-10-CM | POA: Diagnosis not present

## 2017-03-13 DIAGNOSIS — X509XXA Other and unspecified overexertion or strenuous movements or postures, initial encounter: Secondary | ICD-10-CM | POA: Insufficient documentation

## 2017-03-13 DIAGNOSIS — Y9389 Activity, other specified: Secondary | ICD-10-CM | POA: Diagnosis not present

## 2017-03-13 DIAGNOSIS — S4992XA Unspecified injury of left shoulder and upper arm, initial encounter: Secondary | ICD-10-CM | POA: Diagnosis present

## 2017-03-13 DIAGNOSIS — E039 Hypothyroidism, unspecified: Secondary | ICD-10-CM | POA: Diagnosis not present

## 2017-03-13 DIAGNOSIS — Y990 Civilian activity done for income or pay: Secondary | ICD-10-CM | POA: Diagnosis not present

## 2017-03-13 MED ORDER — IBUPROFEN 600 MG PO TABS: 600.0000 mg | ORAL_TABLET | Freq: Four times a day (QID) | ORAL | 0 refills | Status: DC | PRN

## 2017-03-13 MED ORDER — IBUPROFEN 200 MG PO TABS
600.0000 mg | ORAL_TABLET | Freq: Once | ORAL | Status: AC
  Administered 2017-03-13: 600 mg via ORAL
  Filled 2017-03-13: qty 3

## 2017-03-13 NOTE — Discharge Instructions (Signed)
Please see the information and instructions below regarding your visit.  Your diagnoses today include:  1. Strain of biceps tendon, left, initial encounter    You likely have a strain in your shoulder. This should resolve with range of motion exercises and ice and ibuprofen. Please keep  your shoulder moving while you are up and awake. You may use the sling at night for comfort.   Tests performed today include: See side panel of your discharge paperwork for testing performed today. Vital signs are listed at the bottom of these instructions.   Medications prescribed:    Take any prescribed medications only as prescribed, and any over the counter medications only as directed on the packaging.  You are prescribed ibuprofen, a non-steroidal anti-inflammatory agent (NSAID) for pain. You may take  every 6 hours as needed for pain. If still requiring this medication around the clock for acute pain after 10 days, please see your primary healthcare provider.  You may combine this medication with Tylenol, 650 mg every 6 hours, so you are receiving something for pain every 3 hours.  This is not a long-term medication unless under the care and direction of your primary provider. Taking this medication long-term and not under the supervision of a healthcare provider could increase the risk of stomach ulcers, kidney problems, and cardiovascular problems such as high blood pressure.    Home care instructions:  Please follow any educational materials contained in this packet.   Follow-up instructions: Please follow-up with your primary care provider for further evaluation of your symptoms if they are not completely improved.   Return instructions:  Please return to the Emergency Department if you experience worsening symptoms.  Please return to the emergency department for any numbness or tingling that is worsening down her arm, weakness of your arm, swelling, or inability to move your arm. Please  return if you have any other emergent concerns.  Your vital signs today were: BP (!) 117/97 (BP Location: Right Arm)    Pulse 77    Temp 98.3 F (36.8 C) (Oral)    Resp 18    SpO2 95%  If your blood pressure (BP) was elevated on multiple readings during this visit above 130 for the top number or above 80 for the bottom number, please have this repeated by your primary care provider within one month. --------------  Thank you for allowing [place] to participate in your care today.

## 2017-03-13 NOTE — ED Provider Notes (Signed)
[facility]-EMERGENCY DEPT Provider Note   CSN: [id number] Arrival date & time: 03/13/17  1815     History   Chief Complaint Chief Complaint  Patient presents with  . Shoulder Pain    HPI Dawn Carlson is a 67 y.o. female.  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 Patient reports that a mailbox fell off and she reached to grab it with female and hand and felt a tension in her left shoulder. Patient reports that in the process of reaching for this f following mailbox she also bent back her left middle 3 fingers. Patient reports that the pain in the left shoulder is approximately 8 out of 10 in severity. Patient denies any numbness or tingling down her left arm or muscular weakness. Patient denies any soft tissue swelling to the shoulder. Patient has not tried anything to improve symptoms since his injury earlier this afternoon. 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  Past Medical History:  Diagnosis Date  . Thyroid disease     Patient Active Problem List   Diagnosis Date Noted  . HYPOTHYROIDISM 04/07/2006  . DENTAL CARIES, SEVERE 04/07/2006  . HYSTERECTOMY, HX OF 04/07/2006  . CARPAL TUNNEL RELEASE, HX OF 04/07/2006    Past Surgical History:  Procedure Laterality Date  . CARPAL TUNNEL RELEASE      OB History    No data available       Home Medications    Prior to Admission medications   Medication Sig Start Date End Date Taking? Authorizing Provider  albuterol (PROVENTIL HFA;VENTOLIN HFA) 108 (90 BASE) MCG/ACT inhaler Inhale 2 puffs into the lungs every 4 (four) hours as needed for wheezing or shortness of breath. 11/12/14   Mabe, Onalee Hua, NP  clindamycin (CLEOCIN) 150 MG capsule Take 1 capsule (150 mg total) by mouth 3 (three) times daily. 04/05/13   Linna Hoff, MD  diclofenac (CATAFLAM) 50 MG tablet Take 1  tablet (50 mg total) by mouth 3 (three) times daily. For dental pain 04/05/13   Linna Hoff, MD  ibuprofen (ADVIL,MOTRIN) 200 MG tablet Take 200 mg by mouth every 6 (six) hours as needed for pain.    [provider]  ibuprofen (ADVIL,MOTRIN) 600 MG tablet Take 1 tablet (600 mg total) by mouth every 6 (six) hours as needed for pain. 08/18/12   Renne Crigler, PA-C  meclizine (ANTIVERT) 50 MG tablet Take 0.5 tablets (25 mg total) by mouth 3 (three) times daily as needed. 11/15/14   Ozella Rocks, MD  predniSONE (DELTASONE) 20 MG tablet Take 3 tabs po on first day, 2 tabs second day, 2 tabs third day, 1 tab fourth day, 1 tab 5th day. Take with food. 11/12/14   Hayden Rasmussen, NP  traMADol (ULTRAM) 50 MG tablet Take 1 tablet (50 mg total) by mouth every 6 (six) hours as needed for pain. 09/13/12   Hayden Rasmussen, NP    Family History Family History  Problem Relation Age of Onset  . Diabetes Other     Social History Social History  Substance Use Topics  . Smoking status: Never Smoker  . Smokeless tobacco: Not on file  . Alcohol use No     Allergies   Patient has no known allergies.   Review of Systems Review of Systems  Musculoskeletal: Positive for arthralgias and myalgias.  Skin: Negative for wound.  Neurological: Negative for weakness and numbness.     Physical Exam Updated Vital Signs BP (!) 117/97 (BP Location: Right Arm)   Pulse 77   Temp 98.3 F (36.8 C) (Oral)   Resp 18   SpO2 95%   Physical Exam  Constitutional: She appears well-developed and well-nourished. No distress.  Sitting comfortably in bed.  HENT:  Head: Normocephalic and atraumatic.  Eyes: Conjunctivae are normal. Right eye exhibits no discharge. Left eye exhibits no discharge.  EOMs normal to gross examination.  Neck: Normal range of motion.  Cardiovascular: Normal rate and regular rhythm.   Intact, 2+ radial pulse.  Pulmonary/Chest:  Normal respiratory effort. Patient converses comfortably.  No audible wheeze or stridor.  Abdominal: She exhibits no distension.  Musculoskeletal: She exhibits no edema.  Left shoulder exhibits no edema. No bony tenderness noted of acromioclavicular joint, clavicle, or head of humerus. Patient tender over coracoclavicular joint. Patient is able to Abduct left shoulder to both 90 and 180, however with pain. Patient has full range of motion of flexion of the left shoulder. Patient presents internal/external rotation. Extension of the left shoulder difficult for patient due to pain. Pain on insertion of the long head of biceps tendon with Yerguson's test. Biceps tendon preserved.  Hand Exam:  Inspection: No swelling, deformity, discoloration, or wound Palpation: No point tenderness, crepitus, tenderness over anatomic snuffbox, or scapholunate joint tenderness ROM: Passive/active ROM intact at wrist, MCP, PIP, and DIP joints, thumb MCP and IP joints, and no rotational deformity of metacarpals noted. Vascular: 2+ radial and ulnar pulses. Capillary refill <2 seconds b/l.  Neurological: She is alert.  Cranial nerves intact to gross observation. Patient moves extremities without difficulty.  Skin: Skin is warm and dry. She is not diaphoretic.  Psychiatric: She has a normal mood and affect. Her behavior is normal. Judgment and thought content normal.  Nursing note and vitals reviewed.    ED Treatments / Results  Labs (all labs ordered are listed, but only abnormal results are displayed) Labs Reviewed - No data to display  EKG  EKG Interpretation None       Radiology No results found.  Procedures Procedures (including critical care time)  Medications Ordered in ED Medications - No data to display   Initial Impression / Assessment and Plan / ED Course  I have reviewed the triage vital signs and the nursing notes.  Pertinent labs & imaging results that were available during my care of the patient were reviewed by me and considered in my  medical decision making (see chart for details).     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 Patient exhibiting signs of a likely biceps tendon strain. Explained to patient the importance of range of motion exercises to preserve range of motion, ice, and NSAID therapy. Dispensed shoulder sling for night comfort. Reassurance provided. Given the reduction in the range of motion and patient's typical work today, completed [employer] paperwork for 4 days. Patient will follow up with appropriate company physician or primary care physician for further valuation if symptoms are not improving at that time. Patient is in understanding and agrees with the plan of care.  New Prescriptions New Prescriptions   No medications on file     Delia Chimes 03/14/17 0025  Little, Ambrose Finland, MD 03/15/17 1200

## 2017-03-13 NOTE — ED Triage Notes (Signed)
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 Patient also reports left hand pain. Movement and sensation to all fingers on left hand.

## 2017-03-17 DEATH — deceased

## 2017-08-05 ENCOUNTER — Ambulatory Visit (INDEPENDENT_AMBULATORY_CARE_PROVIDER_SITE_OTHER): Admitting: Podiatry

## 2017-08-05 ENCOUNTER — Ambulatory Visit (INDEPENDENT_AMBULATORY_CARE_PROVIDER_SITE_OTHER)

## 2017-08-05 ENCOUNTER — Encounter: Payer: Self-pay | Admitting: Podiatry

## 2017-08-05 ENCOUNTER — Telehealth: Payer: Self-pay | Admitting: *Deleted

## 2017-08-05 VITALS — BP 165/100 | HR 66 | Resp 16

## 2017-08-05 DIAGNOSIS — S99921A Unspecified injury of right foot, initial encounter: Secondary | ICD-10-CM

## 2017-08-05 DIAGNOSIS — M7661 Achilles tendinitis, right leg: Secondary | ICD-10-CM

## 2017-08-05 DIAGNOSIS — S86011A Strain of right Achilles tendon, initial encounter: Secondary | ICD-10-CM

## 2017-08-05 NOTE — Telephone Encounter (Signed)
Orders to J. Quintana, RN for pre-cert and faxed to [facility]. 

## 2017-08-05 NOTE — Progress Notes (Signed)
Subjective:  Patient ID: Dawn Carlson, female    DOB: 1951-06-02,  MRN: [id number] HPI Chief Complaint  Patient presents with  . Foot Pain    Posterior heel right - injury at work in 2014, area is very swollen and tender, last treatment was in July 2018-xrayed, set up for surgery but worker's comp denied-treated with injections, creams, pain meds and ice-not any better-worse    67 y.o. female presents with the above complaint.   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  She states that this has been a workers comp case since that time.  Worker's Comp. is failed to allow surgical intervention.  There is never been an improved MRI.  She states that currently her heel feels as if it is about to rip off the back of her foot.  She states that it radiates up into her leg and causes her calf to be painful.  States that she can only work for short.  Before pain is exquisite.  She is not allowed to wear the cam walker to work but has a Cam walker at home which she wears after hours and feels much better.  Past Medical History:  Diagnosis Date  . Thyroid disease    Past Surgical History:  Procedure Laterality Date  . CARPAL TUNNEL RELEASE      Current Outpatient Medications:  .  ibuprofen (ADVIL,MOTRIN) 800 MG tablet, , Disp: , Rfl:   No Known Allergies Review of Systems  Constitutional: Positive for activity change.  Musculoskeletal: Positive for gait problem.  All other systems reviewed and are negative.  Objective:   Vitals:   08/05/17 0841  BP: (!) 165/100  Pulse: 66  Resp: 16    General: Well developed, nourished, in no acute distress, alert and oriented x3   Dermatological: Skin is warm, dry and supple bilateral. Nails x 10 are well maintained; remaining integument appears unremarkable at this  time. There are no open sores, no preulcerative lesions, no rash or signs of infection present.  Vascular: Dorsalis Pedis artery and Posterior Tibial artery pedal pulses are 2/4 bilateral with immedate capillary fill time. Pedal hair growth present. No varicosities and no lower extremity edema present bilateral.   Neruologic: Grossly intact via light touch bilateral. Vibratory intact via tuning fork bilateral. Protective threshold with Semmes Wienstein monofilament intact to all pedal sites bilateral. Patellar and Achilles deep tendon reflexes 2+ bilateral. No Babinski or clonus noted bilateral.   Musculoskeletal: No gross boney pedal deformities bilateral. No pain, crepitus, or limitation noted with foot and ankle range of motion bilateral. Muscular strength 5/5 in all groups tested bilateral.  A large nonpulsatile mass to the posterior aspect of the right ankle heel and foot.  Exquisitely tender on palpation postinflammatory hyperpigmentation is present.  Dorsiflexion is painful and plantar flexion against resistance is immediately painful.  Gait: Unassisted, Nonantalgic.    Radiographs:  Radiographs taken in the office today 3 views of the right foot and ankle demonstrate a large retrocalcaneal heel spur with intratendinous calcification and severe thickening of the distal aspect of the Achilles approximately the distal one third.  More than likely this has been torn or is a chronic tear due to the calcification that is present.  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  Plan: We discussed the etiology pathology conservative versus surgical therapies at this point an MRI is necessary for us to evaluate the distalmost aspect of the Achilles for tears and evaluate for the best way to repair this.  Her ability to maintain her daily activities is not bothered me  can do so with this type of deformity and injury.  An MRI will give us the exact information necessary for surgical repair.  Once the MRI has been performed we will have her back to discuss our surgical intervention which will immediately is to the MRI.     Alana Dayton T. Valley Falls[place]att, [place] Dakota[place]

## 2017-08-05 NOTE — Telephone Encounter (Signed)
-----   Message from Kristian CoveyAshley E Prevette, [facility] sent at 08/05/2017  8:51 AM EST ----- Regarding: MRI MRI - right ankle - evaluate achilles tendon tear vs tendonitis right - surgical consideration

## 2017-08-17 ENCOUNTER — Ambulatory Visit
Admission: RE | Admit: 2017-08-17 | Discharge: 2017-08-17 | Disposition: A | Discharge status: Home / Self Care | Payer: Worker's Compensation | Source: Ambulatory Visit | Attending: Podiatry | Admitting: Podiatry

## 2017-08-17 DIAGNOSIS — S86011A Strain of right Achilles tendon, initial encounter: Secondary | ICD-10-CM

## 2017-08-17 DIAGNOSIS — M7661 Achilles tendinitis, right leg: Secondary | ICD-10-CM

## 2017-08-26 ENCOUNTER — Telehealth: Payer: Self-pay | Admitting: *Deleted

## 2017-08-26 NOTE — Telephone Encounter (Addendum)
I informed pt of Dr. Geryl RankinsHyatt's review of results and request to send copy of MRI disc to radiology specialist for evaluation of the MRI disc for more indepth details for treatment planning. Pt states understanding and request information on her work papers. I told pt Dr. Al CorpusHyatt had other staff to take care of the work papers and I would have them call when in office. I told pt I would cancel her appt for 08/28/2017, if she was not having a new or worsening problem and only had questions concerning her paperwork. Pt states she is concerned with her work papers. Mailed copy of MRI disc to [facility].

## 2017-08-26 NOTE — Telephone Encounter (Signed)
-----   Message from Elinor ParkinsonMax T Hyatt, [place] sent at 08/26/2017  7:40 AM EDT ----- Send for an over read and inform patient of the delay.

## 2017-08-28 ENCOUNTER — Ambulatory Visit: Payer: Self-pay | Admitting: Podiatry

## 2017-09-08 ENCOUNTER — Encounter: Payer: Self-pay | Admitting: Podiatry

## 2017-09-30 ENCOUNTER — Telehealth: Payer: Self-pay | Admitting: *Deleted

## 2017-09-30 ENCOUNTER — Encounter: Payer: Self-pay | Admitting: *Deleted

## 2017-09-30 NOTE — Telephone Encounter (Signed)
Mailed note informing pt of Dr. Geryl Rankinshyatt's request for her to make an appt.

## 2018-01-13 ENCOUNTER — Ambulatory Visit (INDEPENDENT_AMBULATORY_CARE_PROVIDER_SITE_OTHER): Admitting: Podiatry

## 2018-01-13 ENCOUNTER — Encounter (INDEPENDENT_AMBULATORY_CARE_PROVIDER_SITE_OTHER): Payer: Self-pay

## 2018-01-13 ENCOUNTER — Encounter: Payer: Self-pay | Admitting: Podiatry

## 2018-01-13 DIAGNOSIS — S86011D Strain of right Achilles tendon, subsequent encounter: Secondary | ICD-10-CM

## 2018-01-13 NOTE — Progress Notes (Signed)
She presents today for follow-up of her Achilles tendinitis states there is really been bothering her for quite some time now and she is ready to get this thing fixed.  Objective: Vital signs are stable she is alert and oriented x3 Achilles tendinitis right heel is severe on palpation she also has severe pain on palpation of the peroneal tendons.  MRI interpretation does demonstrate severe Achilles tendinitis with tears as well as tears in the peroneal tendons.  Assessment: Peroneal tendinitis with tears and Achilles tendinitis with tenderness.  Plan: Discussed etiology pathology conservative surgical therapies this point consented her for a gastroc recession retrocalcaneal heel spur resection Achilles tenolysis also peroneal tendon repair and cast.  We discussed the possible postop complications we discussed the pros and cons of the surgeries.  She understands she will be nonweightbearing for period of time and will follow-up with me in the near future for surgery.  She signed all 4 pages a consent form.

## 2018-01-13 NOTE — Patient Instructions (Signed)
Pre-Operative Instructions  Congratulations, you have decided to take an important step towards improving your quality of life.  You can be assured that the doctors and staff at [facility] will be with you every step of the way.  Here are some important things you should know:  1. Plan to be at the surgery center/hospital at least 1 (one) hour prior to your scheduled time, unless otherwise directed by the surgical center/hospital staff.  You must have a responsible adult accompany you, remain during the surgery and drive you home.  Make sure you have directions to the surgical center/hospital to ensure you arrive on time. 2. If you are having surgery at [facility] or [facility], you will need a copy of your medical history and physical form from your family physician within one month prior to the date of surgery. We will give you a form for your primary physician to complete.  3. We make every effort to accommodate the date you request for surgery.  However, there are times where surgery dates or times have to be moved.  We will contact you as soon as possible if a change in schedule is required.   4. No aspirin/ibuprofen for one week before surgery.  If you are on aspirin, any non-steroidal anti-inflammatory medications (Mobic, Aleve, Ibuprofen) should not be taken seven (7) days prior to your surgery.  You make take Tylenol for pain prior to surgery.  5. Medications - If you are taking daily heart and blood pressure medications, seizure, reflux, allergy, asthma, anxiety, pain or diabetes medications, make sure you notify the surgery center/hospital before the day of surgery so they can tell you which medications you should take or avoid the day of surgery. 6. No food or drink after midnight the night before surgery unless directed otherwise by surgical center/hospital staff. 7. No alcoholic beverages 24-hours prior to surgery.  No smoking 24-hours prior or 24-hours after  surgery. 8. Wear loose pants or shorts. They should be loose enough to fit over bandages, boots, and casts. 9. Don't wear slip-on shoes. Sneakers are preferred. 10. Bring your boot with you to the surgery center/hospital.  Also bring crutches or a walker if your physician has prescribed it for you.  If you do not have this equipment, it will be provided for you after surgery. 11. If you have not been contacted by the surgery center/hospital by the day before your surgery, call to confirm the date and time of your surgery. 12. Leave-time from work may vary depending on the type of surgery you have.  Appropriate arrangements should be made prior to surgery with your employer. 13. Prescriptions will be provided immediately following surgery by your doctor.  Fill these as soon as possible after surgery and take the medication as directed. Pain medications will not be refilled on weekends and must be approved by the doctor. 14. Remove nail polish on the operative foot and avoid getting pedicures prior to surgery. 15. Wash the night before surgery.  The night before surgery wash the foot and leg well with water and the antibacterial soap provided. Be sure to pay special attention to beneath the toenails and in between the toes.  Wash for at least three (3) minutes. Rinse thoroughly with water and dry well with a towel.  Perform this wash unless told not to do so by your physician.  Enclosed: 1 Ice pack (please put in freezer the night before surgery)   1 Hibiclens skin cleaner     Pre-op instructions  If you have any questions regarding the instructions, please do not hesitate to call our office.  [place]: [address], [place], [place] [address] -- [phone]  [place]: [address] [place], [place] [address] -- [phone]  [place]: [address]  [place], [place] [address] -- [phone]  [place]: [address], [address], [place], [place] [address] -- [phone]  Website:  [email or web address] 

## 2018-01-16 ENCOUNTER — Encounter: Payer: Self-pay | Admitting: Podiatry

## 2018-01-16 ENCOUNTER — Other Ambulatory Visit: Payer: Self-pay | Admitting: Physician Assistant

## 2018-01-16 DIAGNOSIS — Z1231 Encounter for screening mammogram for malignant neoplasm of breast: Secondary | ICD-10-CM

## 2018-02-10 ENCOUNTER — Telehealth: Payer: Self-pay | Admitting: *Deleted

## 2018-02-11 ENCOUNTER — Ambulatory Visit: Payer: Self-pay

## 2018-02-13 NOTE — Telephone Encounter (Signed)
"  I'm calling to see if you got authorization from my Worker's Comp for my surgery?"  I was not aware that you have Worker's Comp.  I had checked your [place].  "That's my regular insurance.  The surgery will be taken care of by Worker's Comp."  I will take care of it.  I attempted to call Almira CoasterGina at the Department of Labor.  Patient's claim number is [id number].  Date of injury was 08/14/2012.

## 2018-02-13 NOTE — Telephone Encounter (Signed)
I called the department of labor and spoke to [facility].  She informed me that the authorization request had to be conducted via fax or online submission.    I faxed the clinicals as well as the requisition form to the department of labor.

## 2018-02-18 NOTE — Telephone Encounter (Signed)
"  I am calling to see if my surgery has been authorized."  I haven't received anything yet.  I will call and check on the status and I will let you know what I find out."

## 2018-02-19 NOTE — Telephone Encounter (Addendum)
"  This is Misty from the surgery center.  We need to reschedule your surgery.  We have not received authorization from the Department of Labor.  It's still pending.  I sent them your clinical notes, there were 19 pages.  They said they only received three pages.  I'm going to resubmit the notes.  "Okay that's fine.  I want it to be authorized so we all benefit from it because I can't afford to pay for the surgery.  When can we reschedule it?"  He can do it on February 27, 2018.  "Let's give them enough time to get it authorized.  What other dates do you have?"  He can do it on March 27, 2018.  "Okay, that should give them enough time."  I'll get it rescheduled.  Misty from the surgical center said they have requested that you give them a call and let them know what's going on.  "What's that all about?"  I'm not sure, give Misty a call, the surgical center phone number is [phone].  I'm sorry to call you back.  Dr. Geryl Rankins schedule is full for October 11.  Can you do it the following week on October 18?  "Yes, that will be fine."

## 2018-02-26 ENCOUNTER — Other Ambulatory Visit

## 2018-03-05 ENCOUNTER — Encounter

## 2018-03-20 NOTE — Telephone Encounter (Signed)
I called the Department of Labor to check on the status of the claim for [facility].  I spoke to [place].  She informed me that the following cpt code;  16109, 27606, [phone], and 28118 have not been approved yet.  She stated that it says it is under further development.  I asked her if anything else was needed.  She said she did not see any messages regarding them needing anything.  She suggested I have the claimant call the claim examiner to see what else is needed.  However, cpt code 09811 has been approved, the authorization number is [id number].  I called and informed the patient.  She asked for the phone number to call.  I told her to call [phone].  She said she'll call them and give me an update next week.

## 2018-03-31 ENCOUNTER — Other Ambulatory Visit: Payer: Self-pay | Admitting: Podiatry

## 2018-03-31 ENCOUNTER — Telehealth: Payer: Self-pay | Admitting: *Deleted

## 2018-03-31 MED ORDER — CEPHALEXIN 500 MG PO CAPS: 500.0000 mg | ORAL_CAPSULE | Freq: Three times a day (TID) | ORAL | 0 refills | Status: DC

## 2018-03-31 MED ORDER — ONDANSETRON HCL 4 MG PO TABS: 4.0000 mg | ORAL_TABLET | Freq: Three times a day (TID) | ORAL | 0 refills | Status: DC | PRN

## 2018-03-31 MED ORDER — OXYCODONE-ACETAMINOPHEN 10-325 MG PO TABS: 1.0000 | ORAL_TABLET | Freq: Three times a day (TID) | ORAL | 0 refills | Status: AC | PRN

## 2018-03-31 NOTE — Telephone Encounter (Deleted)
I received the fax.  The [place] Department of Labor is requesting additional documentation.  They are inquiring why the patient was released to return to work on 01/17/2018.  I called the patient and informed her we are going to cancel her surgery for now.  

## 2018-03-31 NOTE — Telephone Encounter (Signed)
I am calling you in regards to your surgery that is scheduled for this Friday.  We must cancel it.  The department of labor is inquiring about why you were released to go back to work on 01/17/2018.  Dr. Al Corpus did not release you to go back to work.  "Yes, I know.  I was seeing Dr. Elvin So before I started seeing Dr. Al Corpus.  I found out that Dr. Elvin So was not in network with the Department of Labor.  So I came to see Dr. Al Corpus.  Dr. 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  I had to go back because I had bills to take care of but my foot has been killing me every since.  I had no choice in the matter."  Dr. Al Corpus wants to see you for another visit.  We're going to cancel the surgery for Friday.  Would you like me to transfer you to a scheduler?  "Yes, that will be fine."

## 2018-03-31 NOTE — Telephone Encounter (Signed)
I received the fax.  The [place] Department of Labor is requesting additional documentation.  They are inquiring why the patient was released to return to work on 01/17/2018.  I called the patient and informed her we are going to cancel her surgery for now.

## 2018-03-31 NOTE — Telephone Encounter (Signed)
"  My name is Tawni Carnes.  I'm the claims adjustor for Ms. Innis.  I don't think Ms. Vanhook gave you the information that we sent her.  So, I'm calling to see who I need to send it to."  You can send it to me, my name is Raeford Brandenburg.  "Is it okay to send it to [phone]?"  Yes it is but I'd rather you send it to this number, [phone].  "Okay, I'm sending it now.  If you could send [place] the requested information, we can get this authorized as soon as possible."

## 2018-03-31 NOTE — Telephone Encounter (Signed)
I left the patient a message asking if May 07, 2018 would be an okay date to reschedule her surgery.  I asked her to call me back.

## 2018-04-09 ENCOUNTER — Telehealth: Payer: Self-pay | Admitting: Podiatry

## 2018-04-09 ENCOUNTER — Encounter

## 2018-04-09 NOTE — Telephone Encounter (Signed)
Dawn Carlson, this is Dawn Carlson returning your call to reschedule my surgery. Please call me back at [phone].

## 2018-04-14 ENCOUNTER — Encounter: Payer: Self-pay | Admitting: Podiatry

## 2018-04-14 ENCOUNTER — Ambulatory Visit (INDEPENDENT_AMBULATORY_CARE_PROVIDER_SITE_OTHER): Admitting: Podiatry

## 2018-04-14 DIAGNOSIS — S86011D Strain of right Achilles tendon, subsequent encounter: Secondary | ICD-10-CM

## 2018-04-14 NOTE — Progress Notes (Signed)
She presents today to discuss surgical intervention once again.  She states that this is getting worse and is starting to affect her ability to perform her duties.  She states that she does not know how much longer she can continue to work like this.  She states that every step she takes is exquisitely painful.  Objective: Vital signs are stable alert oriented x3 severe pain on palpation of the peroneal tendons and the Achilles tendon at its insertion site of the right heel.  She is also started to develop some new pains that radiate up into her leg pain out of proportion.  Assessment: Chronic intractable pain to the right lower extremity cannot rule out a increase in tearing of the Achilles tendon  Plan: Still requesting Worker's Comp. to pay for her repair of her Achilles and peroneal tendons.

## 2018-04-15 ENCOUNTER — Encounter: Payer: Self-pay | Admitting: Podiatry

## 2018-04-16 ENCOUNTER — Encounter

## 2018-04-21 NOTE — Telephone Encounter (Signed)
"  I'm calling to see if you had already sent the information to Workers Comp and also to make sure you sent the information for the MRI.  I want to make sure the MRI went with the other information that you submitted.  I want to make sure it gets approved this time."

## 2018-04-23 NOTE — Telephone Encounter (Signed)
I attempted to return her call.  I left her messages that I sent all the information to [employer].

## 2018-05-05 NOTE — Telephone Encounter (Signed)
On Friday, November 15 I called the department of Labor and spoke to [place].  The reference number for the call was [id number].  She informed me that the surgery was authorized and the authorization number is [id number].  I asked her if it was for all the cpt codes.  She stated yes.  I asked her if it was authorized for November 21.  She stated no.  She said the authorization was for the surgery that was scheduled for 04/03/2018.  I asked her if the date could be changed to reflect this new date. She said no. You all did not inform us that the surgical date had changed, so the information has to be resubmitted and processed again.  I informed her that they had requested additional information and we had sent that on 04/16/2018.  I told her nothing was mentioned about resubmitting the information.  She said it was our fault that we had not informed them about the change in the surgical date and that this was their policy.  I resubmitted all the information again with the surgery date of 05/07/2018.    I called the Department of Labor again today.  I spoke to [place]iah.  The reference number for the call was [id number].  I told him about my conversation with Rhoena.  I asked him if there was a way the authorization approval date could be extended.  He said the surgery had not been approved yet.  He said it was still pending.   He said that the authorization number that I was given was for only one code which was for the 29405 (cast).  I asked him to take note that the surgery is scheduled for Thursday, November 21.  He said he would take note of it but that he did not have authority to make any decisions about the authorization.  I will call again tomorrow to check on the status.  Aram BeechamCynthia from the surgical center has called several times to inquire about the authorization.  I told her I would try again tomorrow.  She asked me to call LuAnn and inform her because she will be out of the office tomorrow.    I  attempted to call the patient again.  I have not had success in reaching her.  I have left several messages for a return call.

## 2018-05-06 ENCOUNTER — Other Ambulatory Visit: Payer: Self-pay | Admitting: Podiatry

## 2018-05-06 ENCOUNTER — Telehealth: Payer: Self-pay | Admitting: *Deleted

## 2018-05-06 MED ORDER — CEPHALEXIN 500 MG PO CAPS: 500.0000 mg | ORAL_CAPSULE | Freq: Three times a day (TID) | ORAL | 0 refills | Status: DC

## 2018-05-06 MED ORDER — OXYCODONE-ACETAMINOPHEN 10-325 MG PO TABS: 1.0000 | ORAL_TABLET | ORAL | 0 refills | Status: AC | PRN

## 2018-05-06 MED ORDER — ONDANSETRON HCL 4 MG PO TABS: 4.0000 mg | ORAL_TABLET | Freq: Three times a day (TID) | ORAL | 0 refills | Status: DC | PRN

## 2018-05-06 NOTE — Telephone Encounter (Signed)
Thank you and please let me know so I don't waste my afternoon waiting on a patient.

## 2018-05-06 NOTE — Telephone Encounter (Signed)
"  I'm returning your call."  I was calling to let you know that your case is still pending.  We may have to reschedule your surgery.  I have called them several times and I have received conflicting information.  I have submitted all of your paperwork again because I was told whenever the date of surgery changes all the information has to be submitted again and processed.  "That doesn't make sense.  They're just trying to continue to delay this.  I would like to call over there and bless them out but I'm not."  I'm going to call again this afternoon and see what I can find out.  However, would you like to go ahead and schedule it for December 20?  "Do you think it's a good idea to give them a date?  Should we wait until it's authorized first?"  It would be nice if we could but the forms requires a date to process get it authorized.  "That doesn't make any sense.  Go ahead and schedule it for December 20."  I'll get it scheduled and I will keep you informed.  "Okay thank you."

## 2018-05-06 NOTE — Telephone Encounter (Signed)
I called the Department of Labor and I spoke to [place]becca.  The reference number is [id number].  Lurena JoinerRebecca informed me that the surgery had not been authorized.  She stated that the reason was that the billed diagnosis codes are not related to the accepted condition.  She said the codes were M77.31, M71.571, and M76.61.  I informed her that those were not the codes I had put down on the requisition.  I told her I put M77.30, M76.60, and S86.011D.  She said she had no access to what was submitted.  She said this is the codes the adjustor mentioned.  She stated they were going to send the claimant a letter informing her of what is needed.  I called LuAnn at the surgical center and informed her.  She said the above codes were probably laterality specific.  I told her they still didn't have the S86.011D code.  I told her we were going to reschedule the surgery to December 20.  I called and informed Ms. Kaigler of what is going on.  I asked her to please let us know when she gets the letter about what needs to be done next.  I told her we will get her rescheduled to June 05, 2018.

## 2018-05-08 NOTE — Telephone Encounter (Signed)
I informed the patient that her surgery has not been authorized.  It's still pending.

## 2018-05-13 ENCOUNTER — Other Ambulatory Visit

## 2018-05-20 ENCOUNTER — Ambulatory Visit: Payer: Self-pay

## 2018-05-20 NOTE — Telephone Encounter (Signed)
[name] came by the office to bring me her note from Dr. Elvin SoPetery office and a letter from the Department of Labor, that requests additional information.

## 2018-05-21 ENCOUNTER — Encounter

## 2018-05-21 ENCOUNTER — Encounter: Payer: Self-pay | Admitting: Podiatry

## 2018-05-22 NOTE — Telephone Encounter (Signed)
I faxed ALL forms to the Department of Labor again with the additional requested narrative from Dr. Al CorpusHyatt.

## 2018-05-29 ENCOUNTER — Telehealth: Payer: Self-pay | Admitting: Podiatry

## 2018-05-29 NOTE — Telephone Encounter (Signed)
Pt is scheduled for surgery on 20 December for which she has been rescheduled multiple times. I know its a workers comp case through [place] Dept of Labor. The diagnosis have to match what's in their system. Whatever was called in, I've got S86.10, M76.60 and M77.30. none of that matches what they have on the [employer] web site. They have M76.61, M71.571 and M77.31 so I think what you guys called in is similar its just not specific, where as they have right. Your codes you put in are unspecified. They have to be specific and then have the clinical before we can even get our authorization. I just wanted to bring that to your attention. If you have any questions on it, give me a call back at [phone].

## 2018-06-03 ENCOUNTER — Other Ambulatory Visit: Payer: Self-pay | Admitting: Podiatry

## 2018-06-03 ENCOUNTER — Telehealth: Payer: Self-pay | Admitting: *Deleted

## 2018-06-03 MED ORDER — ONDANSETRON HCL 4 MG PO TABS: 4.0000 mg | ORAL_TABLET | Freq: Three times a day (TID) | ORAL | 0 refills | Status: DC | PRN

## 2018-06-03 MED ORDER — CEPHALEXIN 500 MG PO CAPS: 500.0000 mg | ORAL_CAPSULE | Freq: Three times a day (TID) | ORAL | 0 refills | Status: DC

## 2018-06-03 MED ORDER — OXYCODONE-ACETAMINOPHEN 10-325 MG PO TABS: 1.0000 | ORAL_TABLET | Freq: Four times a day (QID) | ORAL | 0 refills | Status: AC | PRN

## 2018-06-03 NOTE — Telephone Encounter (Signed)
Melanie from Department of Labor Reference number for the call is [id number].  The case is still pending.  It's under review.  Only one procedure code has been approved and it is the [id number], which doesn't require authorization.  The reference number for this code is [id number].  You need to call the district office if you want to check on the status of the claim."  What's that phone number.  You will have to call the claimant to get that information or go on the https://[email or web address]/owcpmed.dol.gov and get the district office number from it.  I can't tell you because we don't have access to it on our end."  I called the patient to get the district office number.  She gave me two numbers, one was [phone] which was for a police department number.  The other was [phone].  I attempted to call it.  This number was for the Department of Labor but they were closed.  Their hours of operation are 8 am to 4:30 pm.  This is the [place] office and their free number is [phone].

## 2018-06-04 ENCOUNTER — Telehealth: Payer: Self-pay | Admitting: *Deleted

## 2018-06-04 ENCOUNTER — Encounter

## 2018-06-04 NOTE — Telephone Encounter (Signed)
I got a call from the surgical center.  They said you were wanting to wait until Monday, the 30th.  If we change the date, we will have to start the whole process over of getting it authorized.  "I am fine with doing it tomorrow."  I was informed by Aram Beechamynthia at the surgical center that they are going to call your claim adjustor in the morning to find out if your surgery is really authorized.  "That's what they told me.  I don't know if I should go to work or not.  I have to be at work at 6 am.  I don't want to not go to work and then not have the surgery.  What would you do?  I been waiting to have this surgery for five years now."  I might would take off to have the surgery but I can't tell you what to do.  That's a decision you will have to make because I don't want you to lose your job.  "I will do what I have to do and deal with the situation later.  I need to have this done."

## 2018-06-04 NOTE — Telephone Encounter (Signed)
"  I'm returning your call."  Yes, I was calling to check on the status of the authorization request for Dawn Carlson.  Is there anything else needed?  "No, her surgery has been approved."  It has been approved!  Is there an approval number?"  I can't give you the number.  I have to send the information back to the [employer] and they will give you the approval number.  It normally takes about 24 to 48 hours for them to do that."  Okay, but the surgery has been approved, correct?  "Yes, that is correct."  Great, thank you so much.  I called Dawn Carlson at the surgical center to inform her.  I gave her the phone number for Dawn Carlson, so they can call him to verify that the surgery has been approved. [phone]   I attempted to call the patient to inform her that the surgery was approved.  I left her messages to call me back.  "I am calling you back."  I am calling to let you know your surgery has been approved!  "You have to be kidding me.  I had not even told my job this time because I didn't think it would happen.  I'll have to go back by the office and tell them.  What time will I need to be there tomorrow?"  You can call them and ask.  The surgery center phone number is [phone], this is the direct number to Dawn Carlson.  This is a great Christmas present for you.  "Yes, it is.  Thank you so much."

## 2018-06-04 NOTE — Telephone Encounter (Signed)
I attempted to call the district office.  I was told I needed to call [phone].  This was the main number for the Department of Labor.  I called this number and spoke to Dawn Carlson, call reference number [id number].  She said it was still under review for further development.  She gave me instructions on how to find the district office claim adjustor's number for this case on their website.  She said the district is number Carlson.  I went to the website and obtained the phone number to Dawn Carlson, Dawn Carlson.  I left a message for Dawn Carlson, asking him to return my call for the status of the authorization request.

## 2018-06-05 ENCOUNTER — Encounter: Payer: Self-pay | Admitting: Podiatry

## 2018-06-05 DIAGNOSIS — M216X1 Other acquired deformities of right foot: Secondary | ICD-10-CM

## 2018-06-05 DIAGNOSIS — M7661 Achilles tendinitis, right leg: Secondary | ICD-10-CM

## 2018-06-05 DIAGNOSIS — M7731 Calcaneal spur, right foot: Secondary | ICD-10-CM

## 2018-06-05 DIAGNOSIS — S86011D Strain of right Achilles tendon, subsequent encounter: Secondary | ICD-10-CM

## 2018-06-05 NOTE — Telephone Encounter (Signed)
"  This is Horace H.  I am returning your call."  I am calling in regards to Ms. Clarks surgery scheduled for today.  The surgical center I saying that they need to speak to you in regards to the approval of her surgery.  "I'm only going to be able to tell them what I told you .  I cannot give them a number, which is what you asked for.  All I can tell them is that it is approved.  It will take 24 - 48 hours to get an actual authorization number."  Well could you call and give them this information because they are trying to cancel her surgery.  "I'll give them a call.  What's the phone number?"  You can reach them at [phone].  "Who should I ask for?"  You can ask for Sherlynn Carbonynthia, Renee, or LouAnn.     "My surgery is a go.  I just got off the phone with someone from the surgery center.  However, I do need a note for my job.  This was last minute in letting them know about the surgery.  So, they need a note letting them know that I'm having surgery and how long I will be out of work."  Okay, I will take care of that.  Do you have a fax number to who I need to send it to?  "No, I will have my daughter stop by there to pick it up after my surgery and she can take it to my job and give it to them."  Okay, I will have it ready.

## 2018-06-11 ENCOUNTER — Ambulatory Visit (INDEPENDENT_AMBULATORY_CARE_PROVIDER_SITE_OTHER): Payer: 59

## 2018-06-11 ENCOUNTER — Ambulatory Visit (INDEPENDENT_AMBULATORY_CARE_PROVIDER_SITE_OTHER): Payer: Self-pay | Admitting: Podiatry

## 2018-06-11 DIAGNOSIS — Z09 Encounter for follow-up examination after completed treatment for conditions other than malignant neoplasm: Secondary | ICD-10-CM | POA: Diagnosis not present

## 2018-06-11 DIAGNOSIS — S86011D Strain of right Achilles tendon, subsequent encounter: Secondary | ICD-10-CM

## 2018-06-11 NOTE — Progress Notes (Signed)
This patient presents the office today 1 week after foot surgery on her right rear foot.  She had tendon repair and repair of her peroneals and her Achilles tendon.  She also has a gastroc recession in the back of the right leg.  She presents the office today wearing a cast that was applied at the surgical center as well as walking with her scooter.   She says she is not having any pain or discomfort.  She does admit she has occasional numbness in her toes which resolves quickly.  She also states that she feels the cast has gotten smaller in the calf area but is intact in the foot area.  She presents the office today for x-rays and her first postoperative office visit.  Objective cast is intact.  The digits revealed normal capillary return upon exam.  Normal temperature noted through the digits.  Postoperative visit #1.  ROV.  X-rays taken of the foot reveal staples noted at the Achilles tendon surgical site in the peroneal surgical site.  No evidence of any bony pathology noted following her surgery.  She was instructed to continue to move with her scooter and keep her cast clean and dry.  She is scheduled for return office visit with Dr. Al CorpusHyatt in 10 days.  Patient to call the office if any problems occur.  Helane GuntherGregory Sehaj Mcenroe DPM

## 2018-06-16 ENCOUNTER — Ambulatory Visit (INDEPENDENT_AMBULATORY_CARE_PROVIDER_SITE_OTHER): Admitting: Podiatry

## 2018-06-16 DIAGNOSIS — S86011D Strain of right Achilles tendon, subsequent encounter: Secondary | ICD-10-CM

## 2018-06-16 DIAGNOSIS — Z09 Encounter for follow-up examination after completed treatment for conditions other than malignant neoplasm: Secondary | ICD-10-CM

## 2018-06-16 NOTE — Progress Notes (Signed)
She presents today status post peroneal tendon repair left gastroc recession Achilles tendon repair retrocalcaneal heel spur resection left foot.  She denies fever chills nausea vomiting muscle aches pains calf pain back pain chest pain.  Date of surgery 06/05/2018.  Objective: Vital signs are stable alert oriented x3.  Pulses are palpable.  Neurologic sensorium is intact.  Deep tendon flexors are intact.  Muscle strength is normal symmetrical all this was the cast was removed.  Sutures are intact margins well coapted staples are intact.  She has good plantar flexion against resistance dorsiflexion inversion and eversion.  Assessment: Well-healing surgical foot.  Plan: Redressed today dressed a compressive dressing and another below-knee cast.  We will follow-up with her in 2 weeks to remove the cast.

## 2018-06-18 ENCOUNTER — Encounter

## 2018-07-02 ENCOUNTER — Ambulatory Visit (INDEPENDENT_AMBULATORY_CARE_PROVIDER_SITE_OTHER): Admitting: Podiatry

## 2018-07-02 DIAGNOSIS — S86011D Strain of right Achilles tendon, subsequent encounter: Secondary | ICD-10-CM

## 2018-07-02 DIAGNOSIS — Z09 Encounter for follow-up examination after completed treatment for conditions other than malignant neoplasm: Secondary | ICD-10-CM

## 2018-07-02 NOTE — Progress Notes (Signed)
She presents today her cast was removed date of surgery 06/05/2018 status post retrocalcaneal heel spur resection peroneal tendon repair Achilles tenolysis and cast application.  She states that she is doing quite well without any problems.  Objective: Vital signs are stable alert oriented x3 there is no erythema edema cellulitis drainage or odor sutures are intact margins well coapted mild dehiscence distal peroneal incision.  But very minimal.  Appears to be only epidermis.  Assessment: Well-healing surgical foot.  Plan: Place her dressed a compressive dressing which she can remove in 1 week demonstrated partial weightbearing which she will start immediately and I will follow-up with her in 2 weeks.

## 2018-07-09 ENCOUNTER — Encounter: Payer: Self-pay | Admitting: *Deleted

## 2018-07-09 ENCOUNTER — Telehealth: Payer: Self-pay | Admitting: *Deleted

## 2018-07-09 NOTE — Telephone Encounter (Signed)
"  I have some forms that I need filled out.  Who do I get those to for my disability?"  Dawn Carlson completes disability forms.  She's here today if you want to bring them in.  She's only here on Wednesdays and Thursdays.  "I'll get someone to bring me to drop them off.  "Can you tweak the letter that you gave me a little for my job?  You had put in there that I would be out for eight to twelve weeks.  Can you put the specific dates?  I'll pick it up when I drop off my papers for [name]."  I added the specific dates to her letter.  I put the letter at the front desk.

## 2018-07-10 ENCOUNTER — Encounter: Payer: Self-pay | Admitting: Podiatry

## 2018-07-16 ENCOUNTER — Ambulatory Visit (INDEPENDENT_AMBULATORY_CARE_PROVIDER_SITE_OTHER): Admitting: Podiatry

## 2018-07-16 DIAGNOSIS — S86011D Strain of right Achilles tendon, subsequent encounter: Secondary | ICD-10-CM

## 2018-07-16 DIAGNOSIS — Z09 Encounter for follow-up examination after completed treatment for conditions other than malignant neoplasm: Secondary | ICD-10-CM

## 2018-07-16 NOTE — Progress Notes (Signed)
She presents today date of surgery 06/05/2018 status post repair peroneal tendon right Achilles tenolysis right gastroc recession right retrocalcaneal heel spur resection right and cast application.  She states that I have been walking using my crutches and a knee scooter.  Pain is about a 4 out of 10 when walking but all in all is not too bad.  She denies calf pain back pain chest pain shortness of breath.  Objective: Presents today utilizing her knee scooter with her cam walker retained.  Once removed demonstrates compression anklet and a few staples left hip staples were removed.  She has great range of motion of the foot the peroneal tendon is in good shape there is no fluctuance I see no signs of infection and all markers point to this healing well.  Assessment: Well-healing surgical ankle and leg.  Plan: Discussed etiology pathology and surgical therapies at this point placed her back in a compression garment and in her Cam walker she will still be partial weightbearing for the next 3 weeks I will follow-up with her at that time.

## 2018-07-22 ENCOUNTER — Ambulatory Visit
Admission: RE | Admit: 2018-07-22 | Discharge: 2018-07-22 | Disposition: A | Discharge status: Home / Self Care | Payer: 59 | Source: Ambulatory Visit | Attending: Physician Assistant | Admitting: Physician Assistant

## 2018-07-22 DIAGNOSIS — Z1231 Encounter for screening mammogram for malignant neoplasm of breast: Secondary | ICD-10-CM

## 2018-08-04 ENCOUNTER — Ambulatory Visit (INDEPENDENT_AMBULATORY_CARE_PROVIDER_SITE_OTHER): Admitting: Podiatry

## 2018-08-04 ENCOUNTER — Encounter: Payer: Self-pay | Admitting: Podiatry

## 2018-08-04 ENCOUNTER — Telehealth: Payer: Self-pay | Admitting: *Deleted

## 2018-08-04 DIAGNOSIS — Z9889 Other specified postprocedural states: Secondary | ICD-10-CM

## 2018-08-04 DIAGNOSIS — S86011D Strain of right Achilles tendon, subsequent encounter: Secondary | ICD-10-CM

## 2018-08-04 NOTE — Telephone Encounter (Signed)
-----   Message from Kristian Covey, [facility] sent at 08/04/2018 10:04 AM EST ----- Regarding: PT referral [facility] in house -   s/p peroneal tendon repair, achilles tenolysis, calcaneal heel spur resection and gastroc recession right - DOS 06/05/18  3 x week x 4 weeks  Increase mobility and strength and decrease pain and swelling  This will need insurance approval prior to visit - per [facility]

## 2018-08-04 NOTE — Telephone Encounter (Addendum)
Delivered orders to [facility] - In-office. 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

## 2018-08-04 NOTE — Telephone Encounter (Signed)
Completed Worker's Comp form for PT - Physical Therapy/Occupational Therapy Authorization Request, copied 06/06/2019 Operative Report, post op clinicals 06/12/2019 to 07/16/2018 waiting 08/04/2018 clinical to fax [phone].

## 2018-08-05 ENCOUNTER — Telehealth: Payer: Self-pay | Admitting: Podiatry

## 2018-08-05 NOTE — Telephone Encounter (Signed)
Labor department agent, Casimiro Needle called requesting a copy of the orders for physical therapy from doctor for patient. Please fax orders over.   Fax# [phone]

## 2018-08-05 NOTE — Telephone Encounter (Signed)
Faxed PT orders written on script and same PT [facility] order, clinicals and demographics from 08/04/2018 to [facility] - Dept. Of Labor.

## 2018-08-05 NOTE — Progress Notes (Signed)
She presents today date of surgery 06/05/2018 status post repair peroneal tendon right Achilles tenolysis right gastroc recession right retrocalcaneal heel spur resection right cast right she states that she did fall off scooter landing her entire body weight on her leg.  After hitting a small palpable she turned over the scooter landing on her foot.  She denies any calf pain back pain chest pain shortness of breath denies any trauma due to the fall other than straining the right foot which had the boot on at the time.  Objective: Vital signs are stable alert and oriented x3 edema has come down considerably she has good plantar flexion against resistance.  She has mild tenderness on the lateral aspect of the posterior Achilles minimal pain on palpation of the peroneal's and no pain on palpation of the gastroc.  Assessment: Well-healing surgical foot leg.  Plan: At this point I am going to request some physical therapy 1 request that she discontinue the use of the knee scooter and start with weightbearing utilizing the boot at all times.  I will follow-up with her in 1 month she will remain out of work until I feel that she is strong enough to return and to perform her duties.

## 2018-08-05 NOTE — Telephone Encounter (Signed)
Faxed required form Physical Therapy/Occupational Therapy Authorization Request, clinicals with clinical of 08/04/2018 and demographics with Claim:  [id number], Reference:  [id number].

## 2018-08-20 ENCOUNTER — Telehealth: Payer: Self-pay | Admitting: *Deleted

## 2018-08-20 NOTE — Telephone Encounter (Signed)
"  I am calling from [employer].  I need to know where Lonzo Cloud had surgery at on December 20,2019."  She had surgery at [facility].  "Is that an ambulatory surgical center?"  Yes it is an ambulatory surgical center.  "That's all I needed."

## 2018-08-25 ENCOUNTER — Encounter: Payer: Self-pay | Admitting: Podiatry

## 2018-08-25 ENCOUNTER — Encounter: Payer: Self-pay | Admitting: *Deleted

## 2018-08-25 ENCOUNTER — Telehealth: Payer: Self-pay | Admitting: *Deleted

## 2018-08-25 DIAGNOSIS — M79676 Pain in unspecified toe(s): Secondary | ICD-10-CM

## 2018-08-25 DIAGNOSIS — H9313 Tinnitus, bilateral: Secondary | ICD-10-CM | POA: Insufficient documentation

## 2018-08-25 NOTE — Telephone Encounter (Signed)
Pt asked the status of the Worker's Comp referral to PT. I have not received PA for pt's PT. Faxed letter to Casimiro Needle - Dept. Of Labor requesting notification for PT from faxed referral request, clinical and demographics of 08/05/2018 4:09pm.

## 2018-08-26 ENCOUNTER — Encounter: Payer: Self-pay | Admitting: Podiatry

## 2018-08-26 DIAGNOSIS — E7849 Other hyperlipidemia: Secondary | ICD-10-CM | POA: Insufficient documentation

## 2018-08-26 DIAGNOSIS — E782 Mixed hyperlipidemia: Secondary | ICD-10-CM | POA: Insufficient documentation

## 2018-08-28 ENCOUNTER — Other Ambulatory Visit: Payer: Self-pay | Admitting: Physician Assistant

## 2018-08-28 DIAGNOSIS — E2839 Other primary ovarian failure: Secondary | ICD-10-CM

## 2018-09-01 ENCOUNTER — Ambulatory Visit
Admission: RE | Admit: 2018-09-01 | Discharge: 2018-09-01 | Disposition: A | Discharge status: Home / Self Care | Payer: 59 | Source: Ambulatory Visit | Attending: Physician Assistant | Admitting: Physician Assistant

## 2018-09-01 ENCOUNTER — Other Ambulatory Visit: Payer: Self-pay

## 2018-09-01 DIAGNOSIS — E2839 Other primary ovarian failure: Secondary | ICD-10-CM

## 2018-09-29 ENCOUNTER — Encounter: Payer: Self-pay | Admitting: Podiatry

## 2018-09-29 ENCOUNTER — Telehealth: Payer: Self-pay | Admitting: *Deleted

## 2018-09-29 ENCOUNTER — Other Ambulatory Visit: Payer: Self-pay

## 2018-09-29 ENCOUNTER — Ambulatory Visit (INDEPENDENT_AMBULATORY_CARE_PROVIDER_SITE_OTHER): Admitting: Podiatry

## 2018-09-29 VITALS — Temp 98.1°F

## 2018-09-29 DIAGNOSIS — S86011D Strain of right Achilles tendon, subsequent encounter: Secondary | ICD-10-CM

## 2018-09-29 DIAGNOSIS — Z9889 Other specified postprocedural states: Secondary | ICD-10-CM

## 2018-09-29 DIAGNOSIS — Z09 Encounter for follow-up examination after completed treatment for conditions other than malignant neoplasm: Secondary | ICD-10-CM

## 2018-09-29 NOTE — Progress Notes (Signed)
Dawn Carlson presents today with a postop visit date of surgery June 05, 2018 that is post repair peroneal tendon Achilles tenolysis gastroc recession retrocalcaneal heel spur resection states that still really swollen but the soreness has decreased considerably.  States that physical therapy really seem to help.  She states that she just got out of her boot over the weekend and has tried shoe she states that is so swollen it will not fit the shoe adequately I would like to continue some physical therapy if possible.  Objective: Vital signs are stable she is alert and oriented x3.  Pulses are palpable.  Moderate edema to the right lower extremity no pain on direct palpation of the foot she has good plantar flexion against resistance.  Mild tenderness on palpation medial aspect of the ankle and leg.  Assessment: So slowly healing surgical foot leg right.  Plan: I highly recommend another 4 to 6 weeks of physical therapy to help with the edema and the strength training as well as gait.  On follow-up with her in 4 weeks recommended that she be out of work for another 8 weeks.

## 2018-09-29 NOTE — Telephone Encounter (Signed)
-----   Message from Kristian Covey, [facility] sent at 09/29/2018  8:35 AM EDT ----- Regarding: Extend PT Extend PT for another 8 weeks

## 2018-09-29 NOTE — Telephone Encounter (Signed)
Hand-delivered to [facility] - In-office, with note stating PT orders to Labor Department Worker's Comp. Agent - Danie Binder.

## 2018-09-29 NOTE — Telephone Encounter (Signed)
Faxed fax face sheet with note stating [employer] prescription form is attached, referral form, and demographics with LOV 09/29/2018 to Department of Labor - Worker's Comp. Agent-Michael Katina Degree.

## 2018-10-27 ENCOUNTER — Other Ambulatory Visit: Payer: Self-pay

## 2018-10-27 ENCOUNTER — Ambulatory Visit (INDEPENDENT_AMBULATORY_CARE_PROVIDER_SITE_OTHER): Admitting: Podiatry

## 2018-10-27 ENCOUNTER — Telehealth: Payer: Self-pay | Admitting: *Deleted

## 2018-10-27 ENCOUNTER — Encounter: Payer: Self-pay | Admitting: Podiatry

## 2018-10-27 VITALS — Temp 97.3°F

## 2018-10-27 DIAGNOSIS — Z9889 Other specified postprocedural states: Secondary | ICD-10-CM | POA: Diagnosis not present

## 2018-10-27 DIAGNOSIS — S86011D Strain of right Achilles tendon, subsequent encounter: Secondary | ICD-10-CM

## 2018-10-27 DIAGNOSIS — M7661 Achilles tendinitis, right leg: Secondary | ICD-10-CM

## 2018-10-27 NOTE — Telephone Encounter (Signed)
Faxed [employer] PT form with clinicals and demographics to Department Labor - Dawn Carlson with request for fax numbers to any other department or staffer the referral would need to be faxed.

## 2018-10-27 NOTE — Telephone Encounter (Signed)
-----   Message from Kristian Covey, [facility] sent at 10/27/2018  8:39 AM EDT ----- Regarding: PT Follow up on her PT - workers  comp

## 2018-10-27 NOTE — Telephone Encounter (Signed)
Referral to [facility] - In-office. 

## 2018-10-27 NOTE — Progress Notes (Signed)
She presents today date of surgery 06/05/2018 repair peroneal tendons Achilles tenolysis gastroc recession retrocalcaneal heel spur resection right foot.  This was repaired 6 years after initial injury and the slow bureaucratic motion resulted in worsening of her condition.  Currently she states that she is doing much better however she has not been approved for her physical therapy.  She still has some edema and some slight limitation on motion.  Some stiffness in the back of the leg.  Objective: Vital signs are stable alert and oriented x3 pulses are palpable.  Mild edema to the right lower extremity full plantar flexion against resistance without pain.  She has good dorsiflexion though it is tight and she can only reach about 90 degrees of dorsiflexion.  Assessment: Well-healing surgical foot would like a little more physical therapy and range of motion.  Plan: At this point she is trying to get more physical therapy prior to going back to work.  I highly recommend more physical therapy aggressive physical therapy and OT to increase the strength stability and motion of her ankle and lower extremity musculature after 6 years of minimal use and surgical reconstruction.  Without this I feel that she may reinjure her foot and become permanently disabled.  I will not allow her to return to work until physical therapy is complete.  I will follow-up with her in the middle of July after physical therapy has been completed.

## 2018-12-11 DIAGNOSIS — M79676 Pain in unspecified toe(s): Secondary | ICD-10-CM

## 2018-12-29 ENCOUNTER — Ambulatory Visit (INDEPENDENT_AMBULATORY_CARE_PROVIDER_SITE_OTHER): Admitting: Podiatry

## 2018-12-29 ENCOUNTER — Encounter: Payer: Self-pay | Admitting: Podiatry

## 2018-12-29 ENCOUNTER — Other Ambulatory Visit: Payer: Self-pay

## 2018-12-29 VITALS — Temp 98.1°F

## 2018-12-29 DIAGNOSIS — S86011D Strain of right Achilles tendon, subsequent encounter: Secondary | ICD-10-CM

## 2018-12-29 DIAGNOSIS — Z9889 Other specified postprocedural states: Secondary | ICD-10-CM | POA: Diagnosis not present

## 2018-12-29 NOTE — Progress Notes (Signed)
She presents today date of surgery 06/05/2018 status post repair peroneal tendon Achilles tenolysis retrocalcaneal heel spur resection gastroc recession and cast.  She is doing very well at this point.  States that she is almost back to full activity.  States that physical therapy states that she needs to continue for a bit longer.  Objective: Vital signs are stable she is alert and oriented x3 minimal edema no erythema cellulitis drainage odor no open lesions or wounds she has good plantar flexion against resistance.  Walks with minimal antalgia.  Assessment: Well-healing surgical foot leg right.  Plan: Continue physical therapy I will follow-up with her in about 6 weeks and hopefully be able to get her back to work by September

## 2019-01-01 ENCOUNTER — Encounter: Payer: Self-pay | Admitting: Podiatry

## 2019-01-08 ENCOUNTER — Telehealth: Payer: Self-pay | Admitting: *Deleted

## 2019-01-08 NOTE — Telephone Encounter (Signed)
[employer] concerning the status of patient's continued disability status. Please call at [phone], claims dept. to discuss this information.  Ref # [id number].

## 2019-02-09 ENCOUNTER — Ambulatory Visit (INDEPENDENT_AMBULATORY_CARE_PROVIDER_SITE_OTHER): Admitting: Podiatry

## 2019-02-09 ENCOUNTER — Encounter: Payer: Self-pay | Admitting: Podiatry

## 2019-02-09 ENCOUNTER — Other Ambulatory Visit: Payer: Self-pay

## 2019-02-09 VITALS — Temp 98.3°F

## 2019-02-09 DIAGNOSIS — Z9889 Other specified postprocedural states: Secondary | ICD-10-CM | POA: Diagnosis not present

## 2019-02-09 DIAGNOSIS — S86011D Strain of right Achilles tendon, subsequent encounter: Secondary | ICD-10-CM | POA: Diagnosis not present

## 2019-02-09 NOTE — Progress Notes (Signed)
She presents today for a follow-up of her peroneal tendon repair and her Achilles tendon repair with gastroc recession retrocalcaneal spur excision and casting.  States that is more flexible but is still is still numb in the surgical area and swells around it.  States that upon she tries to mow the yard which takes about 45 minutes her leg becomes very tired and sore.  Has completed physical therapy.  Would like to return to work but may need some restrictions.  Objective: Vital signs are stable alert and oriented x3.  Pulses are palpable.  Neurologic sensorium is intact deep tendon reflexes are intact muscle strength is normal and symmetrical.  Orthopedic evaluation demonstrates all joints distal to the ankle have a full range of motion without crepitation.  Her incision sites have gone on to heal uneventfully she does have some mild tenderness on palpation of the Achilles tendinous insertion site.  Assessment: Well-healing surgical foot right.  Plan: Discussed etiology pathology conservative versus surgical therapies.  At this point would allow her to get back to work with limitations and we will fill out a duty status report and send that in with her I will allow her to start back Monday should there be any questions or concerns or any problems with work or if this is too strenuous to for her or she injures this she is to notify us immediately for reduction and duties.  I will follow-up with her in about 2 months if necessary.  Otherwise as needed.

## 2019-02-23 ENCOUNTER — Telehealth: Payer: Self-pay | Admitting: *Deleted

## 2019-02-23 NOTE — Telephone Encounter (Signed)
Received fax dated 02/10/2019 requesting documentation concerning necessity for possible continued PT, due to no increase in function was noted or decrease of disability. Prepared LOV 02/09/2019 and 12/29/2018 and last PT notes 12/23/2018 to be emailed to [employer], for Bethann Humble - Records Coordinator to send.

## 2019-04-29 ENCOUNTER — Ambulatory Visit: Payer: 59 | Admitting: Podiatry

## 2019-05-04 ENCOUNTER — Ambulatory Visit (INDEPENDENT_AMBULATORY_CARE_PROVIDER_SITE_OTHER): Payer: 59 | Admitting: Podiatry

## 2019-05-04 ENCOUNTER — Ambulatory Visit (INDEPENDENT_AMBULATORY_CARE_PROVIDER_SITE_OTHER): Payer: 59

## 2019-05-04 ENCOUNTER — Other Ambulatory Visit: Payer: Self-pay

## 2019-05-04 DIAGNOSIS — Z9889 Other specified postprocedural states: Secondary | ICD-10-CM

## 2019-05-04 DIAGNOSIS — S86011D Strain of right Achilles tendon, subsequent encounter: Secondary | ICD-10-CM

## 2019-05-05 NOTE — Progress Notes (Signed)
She presents today date of surgery 06/05/2018 repair peroneal tendons tenolysis of the Achilles with repair gastroc recession retrocalcaneal heel spur resection.  She states that her foot feels great she gets some medial tenderness occasionally started back to work part-time.  Continues to limit her walking and lifting of heavy objects.  Objective: Vital signs are stable she is alert and oriented x3 she has great plantarflexion dorsiflexion muscle strength is 5/5.  No open lesions or wounds minimal edema.  Assessment: Well-healing surgical foot right.  Plan: Continue to limit her activity at work and will allow her to go back full-time however we have to limit the lifting to 30 pounds or less there is no squatting no stairs in upper for more driving or sitting than walking.  A lot of follow-up with her in a few months.

## 2019-06-15 ENCOUNTER — Ambulatory Visit (INDEPENDENT_AMBULATORY_CARE_PROVIDER_SITE_OTHER): Admitting: Podiatry

## 2019-06-15 ENCOUNTER — Other Ambulatory Visit: Payer: Self-pay

## 2019-06-15 ENCOUNTER — Encounter: Payer: Self-pay | Admitting: Podiatry

## 2019-06-15 DIAGNOSIS — S86011D Strain of right Achilles tendon, subsequent encounter: Secondary | ICD-10-CM | POA: Diagnosis not present

## 2019-06-15 DIAGNOSIS — Z9889 Other specified postprocedural states: Secondary | ICD-10-CM | POA: Diagnosis not present

## 2019-06-15 NOTE — Progress Notes (Signed)
She presents today date of surgery 06/05/2018.  She was in Achilles tendon repair and a peroneal tendon repair and gastroc recession with a calcaneal exostectomy.  States that seems to be holding up pretty good and I'm getting around at work.  I feel like we can maybe increase some of my duties and decrease some of the restrictions.  Objective: Vital signs are stable alert oriented x3 examination of the right foot demonstrates no pain on palpation of the posterior tibial tendon of the peroneal tendon or of the Achilles tendon.  Everything is in good shape minimal edema no erythema cellulitis drainage or odor no open lesions or wounds are noted.  Assessment: Well-healing surgical foot.  Plan: At this point we are going to continue some of the restrictions but lighten up on some of the others.  I will follow-up with her in about 8 weeks and we will continue to evaluate.

## 2019-06-24 ENCOUNTER — Other Ambulatory Visit: Payer: Self-pay

## 2019-06-24 ENCOUNTER — Ambulatory Visit: Payer: 59 | Admitting: Orthotics

## 2019-06-24 DIAGNOSIS — Z9889 Other specified postprocedural states: Secondary | ICD-10-CM

## 2019-06-24 DIAGNOSIS — S86011D Strain of right Achilles tendon, subsequent encounter: Secondary | ICD-10-CM

## 2019-06-24 DIAGNOSIS — M7661 Achilles tendinitis, right leg: Secondary | ICD-10-CM

## 2019-06-24 NOTE — Progress Notes (Signed)
Cast her for f/o; however didn't submit as there was confusion as to whether her claim was workers comp claim.

## 2019-07-06 ENCOUNTER — Telehealth: Payer: Self-pay | Admitting: Podiatry

## 2019-07-06 NOTE — Telephone Encounter (Signed)
Yes please run through workers comp

## 2019-07-06 NOTE — Telephone Encounter (Signed)
Pt was measured for orthotics and said they should be charged to her workman's comp. I did not see anything in your notes about it. Do I need to get auth from [employer].

## 2019-07-15 ENCOUNTER — Telehealth: Payer: Self-pay | Admitting: Podiatry

## 2019-07-15 NOTE — Telephone Encounter (Signed)
Received  message from Dawn Carlson  Nurse case manager for her w/c after faxing request to him. He stated he closed that case last year but to try to submit with [employer].  I left message for pt to call to discuss further to see if she has a new case manager that I can contact..  Dr Al Corpus wants to try and get it approved thru pts workman's comp.

## 2019-08-17 ENCOUNTER — Ambulatory Visit: Payer: 59 | Admitting: Podiatry

## 2019-08-24 ENCOUNTER — Other Ambulatory Visit: Payer: Self-pay

## 2019-08-24 ENCOUNTER — Ambulatory Visit (INDEPENDENT_AMBULATORY_CARE_PROVIDER_SITE_OTHER): Admitting: Podiatry

## 2019-08-24 ENCOUNTER — Encounter: Payer: Self-pay | Admitting: Podiatry

## 2019-08-24 DIAGNOSIS — S86011D Strain of right Achilles tendon, subsequent encounter: Secondary | ICD-10-CM

## 2019-08-25 NOTE — Progress Notes (Signed)
She presents today for follow-up of her surgical foot right.  She states that is feeling pretty good and after have been walking it does get a little sore at times.  Objective: Vital signs are stable she is alert and oriented x3 much decrease in edema there is no erythema cellulitis drainage or odor all margins to the tendons appear to be normal posteriorly and laterally.  Assessment: Well-healing surgical foot.  Plan: I am going to encourage her to continue her physical therapy and at this point we will continue the limitations on her work.  I will follow-up with her in another 2 months

## 2019-10-26 ENCOUNTER — Ambulatory Visit: Payer: 59 | Admitting: Podiatry

## 2019-11-04 ENCOUNTER — Ambulatory Visit (INDEPENDENT_AMBULATORY_CARE_PROVIDER_SITE_OTHER): Admitting: Podiatry

## 2019-11-04 ENCOUNTER — Other Ambulatory Visit: Payer: Self-pay

## 2019-11-04 ENCOUNTER — Encounter: Payer: Self-pay | Admitting: Podiatry

## 2019-11-04 DIAGNOSIS — S86011D Strain of right Achilles tendon, subsequent encounter: Secondary | ICD-10-CM

## 2019-11-04 DIAGNOSIS — Z9889 Other specified postprocedural states: Secondary | ICD-10-CM

## 2019-11-04 NOTE — Progress Notes (Signed)
She presents today date of surgery 06/05/2018 repair peroneal tendons tenolysis of the Achilles gastroc recession calcaneal ostectomy of the right foot generalized repair of the rear foot secondary to an injury sustained in 2014 on duty.  She states that her foot still feels numb and she still has concern about a weakness in it.  She states that she feels that she is about 65 to 75% back to normal but does not seem to be progressively improving at this point.  Objective: Vital signs are stable she is alert and oriented x3 pulses are palpable.  Incision site is gone to heal uneventfully she has good plantar flexion against resistance that she cannot stand on her forefoot on the surgical leg without holding onto something.  So she can do a heel raise obviously the gastrosoleus complex and the Achilles is still weak but there.  She does have some swelling just on medial lateral side of the Achilles however there is no pain.  Assessment: Well-healed surgical foot though there is a considerable loss of strength and some continued numbness approximately 65 to 75% improved to normal.  Plan: Discussed etiology pathology conservative surgical therapies at this point I feel that more than likely she has good issues when I get I will continue to watch her for any progression for in about 3 months I will evaluate her once again I do feel that if we had not been delayed for many years and repairing this that she would probably be 100% normal but again that is hard to say for certain.  But with the workers [employer] postponing surgery in the typical manner that they do this probably made her surgical outcome less than optimal.  She will continue current restrictions at work until evaluated.

## 2020-02-01 ENCOUNTER — Other Ambulatory Visit: Payer: Self-pay

## 2020-02-01 ENCOUNTER — Ambulatory Visit (INDEPENDENT_AMBULATORY_CARE_PROVIDER_SITE_OTHER): Admitting: Podiatry

## 2020-02-01 DIAGNOSIS — S86011D Strain of right Achilles tendon, subsequent encounter: Secondary | ICD-10-CM | POA: Diagnosis not present

## 2020-02-01 DIAGNOSIS — Z9889 Other specified postprocedural states: Secondary | ICD-10-CM
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
## 2020-02-08 ENCOUNTER — Ambulatory Visit: Payer: 59 | Admitting: Podiatry

## 2020-03-30 ENCOUNTER — Other Ambulatory Visit: Payer: Self-pay | Admitting: Physician Assistant

## 2020-03-30 DIAGNOSIS — Z1231 Encounter for screening mammogram for malignant neoplasm of breast: Secondary | ICD-10-CM

## 2020-04-25 ENCOUNTER — Ambulatory Visit: Payer: 59

## 2020-04-26 ENCOUNTER — Other Ambulatory Visit: Payer: Self-pay

## 2020-04-26 ENCOUNTER — Ambulatory Visit
Admission: RE | Admit: 2020-04-26 | Discharge: 2020-04-26 | Disposition: A | Discharge status: Home / Self Care | Payer: 59 | Source: Ambulatory Visit | Attending: Physician Assistant | Admitting: Physician Assistant

## 2020-04-26 DIAGNOSIS — Z1231 Encounter for screening mammogram for malignant neoplasm of breast: Secondary | ICD-10-CM

## 2020-10-27 ENCOUNTER — Ambulatory Visit (INDEPENDENT_AMBULATORY_CARE_PROVIDER_SITE_OTHER): Payer: 59 | Admitting: Behavioral Health

## 2020-10-27 ENCOUNTER — Other Ambulatory Visit: Payer: Self-pay

## 2020-10-27 DIAGNOSIS — F419 Anxiety disorder, unspecified: Secondary | ICD-10-CM | POA: Diagnosis not present
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
## 2020-12-26 ENCOUNTER — Ambulatory Visit (HOSPITAL_COMMUNITY): Payer: 59 | Admitting: Clinical

## 2021-03-12 ENCOUNTER — Other Ambulatory Visit (HOSPITAL_BASED_OUTPATIENT_CLINIC_OR_DEPARTMENT_OTHER): Payer: Self-pay

## 2021-03-12 ENCOUNTER — Ambulatory Visit (INDEPENDENT_AMBULATORY_CARE_PROVIDER_SITE_OTHER): Payer: Medicare Other | Admitting: Nurse Practitioner

## 2021-03-12 ENCOUNTER — Encounter (HOSPITAL_BASED_OUTPATIENT_CLINIC_OR_DEPARTMENT_OTHER): Payer: Self-pay | Admitting: Nurse Practitioner

## 2021-03-12 ENCOUNTER — Other Ambulatory Visit: Payer: Self-pay

## 2021-03-12 VITALS — BP 157/80 | HR 74 | Ht 66.0 in | Wt 238.4 lb

## 2021-03-12 DIAGNOSIS — Z23 Encounter for immunization: Secondary | ICD-10-CM

## 2021-03-12 DIAGNOSIS — R21 Rash and other nonspecific skin eruption: Secondary | ICD-10-CM | POA: Insufficient documentation

## 2021-03-12 DIAGNOSIS — E039 Hypothyroidism, unspecified: Secondary | ICD-10-CM

## 2021-03-12 DIAGNOSIS — Z0000 Encounter for general adult medical examination without abnormal findings: Secondary | ICD-10-CM | POA: Insufficient documentation

## 2021-03-12 DIAGNOSIS — R29898 Other symptoms and signs involving the musculoskeletal system: Secondary | ICD-10-CM

## 2021-03-12 DIAGNOSIS — L304 Erythema intertrigo: Secondary | ICD-10-CM | POA: Diagnosis not present

## 2021-03-12 DIAGNOSIS — E782 Mixed hyperlipidemia: Secondary | ICD-10-CM | POA: Diagnosis not present

## 2021-03-12 HISTORY — DX: Rash and other nonspecific skin eruption: R21

## 2021-03-12 MED ORDER — ZOSTER VAC RECOMB ADJUVANTED 50 MCG/0.5ML IM SUSR
0.5000 mL | Freq: Once | INTRAMUSCULAR | 1 refills | Status: AC
  Filled 2021-03-12: qty 0.5, 1d supply, fill #0

## 2021-03-12 NOTE — Patient Instructions (Addendum)
Recommendations from today's visit: For the rash under the breasts Keep area cool and dry Change out of damp bras quickly and avoid staying in work-out clothes after working out You can use a small amount of hydrocortisone cream to the area to help it heal.  Gold Bond powder can be helpful to keep the area dry and cool and prevent the rash from forming. If it seems to get worse or does not go away, let me know and we can send in a prescription cream for this Leg Cramping We can check your labs with your physical in the near future to make sure that your electrolytes are in balance and your thyroid is normal I do feel this has a lot to do with hydration I recommend at least 64 ounces of water every day and more when you are exercising.    Information on diet, exercise, and health maintenance recommendations are listed below. This is information to help you be sure you are on track for optimal health and monitoring.   Please look over this and let us know if you have any questions or if you have completed any of the health maintenance outside of [place] so that we can be sure your records are up to date.  ___________________________________________________________  Thank you for choosing [place] [facility] for your Primary Care needs. I am excited for the opportunity to partner with you to meet your health care goals. It was a pleasure meeting you today!  I am an Adult-Geriatric Nurse Practitioner with a background in caring for patients for more than 20 years. I provide primary care and sports medicine services to patients age 94 and older within this office. I am also the director of the APP Fellowship with [facility].   I am passionate about providing the best service to you through preventive medicine and supportive care. I consider you a part of the medical team and value your input. I work diligently to ensure that you are heard and your needs are met in a  safe and effective manner. I want you to feel comfortable with me as your provider and want you to know that your health concerns are important to me.  For your information, our office hours are Monday- Friday 8:00 AM - 5:00 PM At this time I am not in the office on Wednesdays.  If you have questions or concerns, please call our office at [phone] or send [place] a MyChart message and we will respond as quickly as possible.   For all urgent or time sensitive needs we ask that you please call the office to avoid delays. MyChart is not constantly monitored and replies may take up to 72 business hours.  MyChart Policy: MyChart allows for you to see your visit notes, after visit summary, provider recommendations, lab and tests results, make an appointment, request refills, and contact your provider or the office for non-urgent questions or concerns. Providers are seeing patients during normal business hours and do not have built in time to review MyChart messages.  We ask that you allow a minimum of 4 business days for responses to [employer]. For this reason, please do not send urgent requests through [place]. Please call the office at [phone]. Complex MyChart concerns may require a visit. Your provider may request you schedule a virtual or in person visit to ensure we are providing the best care possible. MyChart messages sent after 4:00 PM on Friday will not be received  by the provider until Monday morning.    Lab and Test Results: You will receive your lab and test results on MyChart as soon as they are completed and results have been sent by the lab or testing facility. Due to this service, you will receive your results BEFORE your provider.  I review lab and tests results each morning prior to seeing patients. Some results require collaboration with other providers to ensure you are receiving the most appropriate care. For this reason, we ask that you please allow a minimum of 4 business  days for your provider to receive and review lab and test results and contact you about these.  Most lab and test result comments from the provider will be sent through [place]. Your provider may recommend changes to the plan of care, follow-up visits, repeat testing, ask questions, or request an office visit to discuss these results. You may reply directly to this message or call the office at [phone] to provide information for the provider or set up an appointment. In some instances, you will be called with test results and recommendations. Please let us know if this is preferred and we will make note of this in your chart to provide this for you.    If you have not heard a response to your lab or test results in 72 business hours, please call the office to let us know.   After Hours: For all non-emergency after hours needs, please call the office at [phone] and select the option to reach the on-call provider service. On-call services are shared between multiple [place] offices and therefore it will not be possible to speak directly with your provider. On-call providers may provide medical advice and recommendations, but are unable to provide refills for maintenance medications.  For all emergency or urgent medical needs after normal business hours, we recommend that you seek care at the closest Urgent Care or Emergency Department to ensure appropriate treatment in a timely manner.  [facility] [place] has a 24 hour emergency room located on the ground floor for your convenience.    Please do not hesitate to reach out to [place] with concerns.   Thank you, again, for choosing me as your health care partner. I appreciate your trust and look forward to learning more about you.   Worthy Keeler, DNP, AGNP-c ___________________________________________________________  Health Maintenance Recommendations Screening Testing Mammogram Every 1 -2 years based on history and risk  factors Starting at age 31 Pap Smear Ages 21-39 every 3 years Ages 58-65 every 5 years with HPV testing More frequent testing may be required based on results and history Colon Cancer Screening Every 1-10 years based on test performed, risk factors, and history Starting at age 74 Bone Density Screening Every 2-10 years based on history Starting at age 51 for women Recommendations for men differ based on medication usage, history, and risk factors AAA Screening One time ultrasound Men 56-66 years old who have every smoked Lung Cancer Screening Low Dose Lung CT every 12 months Age 16-80 years with a 30 pack-year smoking history who still smoke or who have quit within the last 15 years  Screening Labs Routine  Labs: Complete Blood Count (CBC), Complete Metabolic Panel (CMP), Cholesterol (Lipid Panel) Every 6-12 months based on history and medications May be recommended more frequently based on current conditions or previous results Hemoglobin A1c Lab Every 3-12 months based on history and previous results Starting at age 41 or earlier with diagnosis of diabetes,  high cholesterol, BMI >26, and/or risk factors Frequent monitoring for patients with diabetes to ensure blood sugar control Thyroid Panel (TSH w/ T3 & T4) Every 6 months based on history, symptoms, and risk factors May be repeated more often if on medication HIV One time testing for all patients 32 and older May be repeated more frequently for patients with increased risk factors or exposure Hepatitis C One time testing for all patients 39 and older May be repeated more frequently for patients with increased risk factors or exposure Gonorrhea, Chlamydia Every 12 months for all sexually active persons 13-24 years Additional monitoring may be recommended for those who are considered high risk or who have symptoms PSA Men 3-68 years old with risk factors Additional screening may be recommended from age 70-69 based on risk  factors, symptoms, and history  Vaccine Recommendations Tetanus Booster All adults every 10 years Flu Vaccine All patients 6 months and older every year COVID Vaccine All patients 12 years and older Initial dosing with booster May recommend additional booster based on age and health history HPV Vaccine 2 doses all patients age 88-26 Dosing may be considered for patients over 26 Shingles Vaccine (Shingrix) 2 doses all adults 4 years and older Pneumonia (Pneumovax 23) All adults 41 years and older May recommend earlier dosing based on health history Pneumonia (Prevnar 50) All adults 62 years and older Dosed 1 year after Pneumovax 23  Additional Screening, Testing, and Vaccinations may be recommended on an individualized basis based on family history, health history, risk factors, and/or exposure.  __________________________________________________________  Diet Recommendations for All Patients  I recommend that all patients maintain a diet low in saturated fats, carbohydrates, and cholesterol. While this can be challenging at first, it is not impossible and small changes can make big differences.  Things to try: Decreasing the amount of soda, sweet tea, and/or juice to one or less per day and replace with water While water is always the first choice, if you do not like water you may consider adding a water additive without sugar to improve the taste other sugar free drinks Replace potatoes with a brightly colored vegetable at dinner Use healthy oils, such as canola oil or olive oil, instead of butter or hard margarine Limit your bread intake to two pieces or less a day Replace regular pasta with low carb pasta options Bake, broil, or grill foods instead of frying Monitor portion sizes  Eat smaller, more frequent meals throughout the day instead of large meals  An important thing to remember is, if you love foods that are not great for your health, you don't have to give them  up completely. Instead, allow these foods to be a reward when you have done well. Allowing yourself to still have special treats every once in a while is a nice way to tell yourself thank you for working hard to keep yourself healthy.   Also remember that every day is a new day. If you have a bad day and "fall off the wagon", you can still climb right back up and keep moving along on your journey!  We have resources available to help you!  Some websites that may be helpful include: www.http://carter.biz/  Www.VeryWellFit.com _____________________________________________________________  Activity Recommendations for All Patients  I recommend that all adults get at least 20 minutes of moderate physical activity that elevates your heart rate at least 5 days out of the week.  Some examples include: Walking or jogging at a pace that allows you  to carry on a conversation Cycling (stationary bike or outdoors) [employer] Yoga Weight lifting Dancing If physical limitations prevent you from putting stress on your joints, exercise in a pool or seated in a chair are excellent options.  Do determine your MAXIMUM heart rate for activity: YOUR AGE - 220 = MAX HeartRate   Remember! Do not push yourself too hard.  Start slowly and build up your pace, speed, weight, time in exercise, etc.  Allow your body to rest between exercise and get good sleep. You will need more water than normal when you are exerting yourself. Do not wait until you are thirsty to drink. Drink with a purpose of getting in at least 8, 8 ounce glasses of water a day plus more depending on how much you exercise and sweat.    If you begin to develop dizziness, chest pain, abdominal pain, jaw pain, shortness of breath, headache, vision changes, lightheadedness, or other concerning symptoms, stop the activity and allow your body to rest. If your symptoms are severe, seek emergency evaluation immediately. If your symptoms are concerning,  but not severe, please let us know so that we can recommend further evaluation.   ________________________________________________________________

## 2021-03-12 NOTE — Assessment & Plan Note (Signed)
No recent test results available Has not been on medication in quite some time Will plan to obtain labs in near future with CPE for further evaluation and recommendations No alarm symptoms present today

## 2021-03-12 NOTE — Assessment & Plan Note (Signed)
Review of current and past medical history, social history, medication, and family history.  Review of care gaps and health maintenance recommendations.  Records from recent providers to be requested if not available in Chart Review or Care Everywhere.  Recommendations for health maintenance, diet, and exercise provided.  Labs today: Wait for CPE HM Recommendations: Shingles vaccine, flu vaccine. Obtain colonoscopy records CPE due: Now- follow-up in near future

## 2021-03-12 NOTE — Assessment & Plan Note (Signed)
Historical- no medications She is exercising daily and watching her diet Will obtain labs with CPE in near future and plan to follow-up based on results.  Recommend low saturated fat diet and increased fiber to help with cholesterol.  Great job on walking and weight loss! Keep this up!

## 2021-03-12 NOTE — Progress Notes (Signed)
Tollie Eth, DNP, AGNP-c Primary Care & Sports Medicine [address]  [address] [place], [place] [address] [phone]  New patient visit   Patient: Dawn Carlson   DOB: 1950-08-12   70 y.o. Female  MRN: [id number] Visit Date: 03/12/2021  Patient Care Team: Garald Rhew, Sung Amabile, NP as PCP - General (Nurse Practitioner)  Today's healthcare provider: Tollie Eth, NP   Chief Complaint  Patient presents with   Establish Care    Patient states she has a rash under both breast.  She states it doesn't hurt, itch, not really painful and just a little red.  She states when she walks for exercise her legs tend to feel heavy.   Subjective    Shadara Lopez is a 70 y.o. female who presents today as a new patient to establish care.  HPI HPI     Establish Care    Additional comments: Patient states she has a rash under both breast.  She states it doesn't hurt, itch, not really painful and just a little red.  She states when she walks for exercise her legs tend to feel heavy.      Last edited by Heloise Ochoa, CMA on 03/12/2021  8:45 AM.      Had colonoscopy about 4-5 years ago- Dr. [place]  RASH Feels like this is due to increased perspiration due to increased physical activity recently and hot weather Location:  Under breasts  where the band of her bra rests Duration:  weeks  Onset: are still present Itching: no Burning: no Redness: yes Oozing: no Scaling: no Blisters: no Painful: no Fevers: no Change in detergents/soaps/personal care products: no Recent illness: no Recent travel:no Shortness of breath: no  Throat/tongue swelling: no Myalgias/arthralgias: yes Insect/Tick bite history: no History of same: no Context: fluctuating Treatments attempted: just keeping clean Alleviating factors: nothing  Heaviness in Legs On and off over the years, but more so since she has increased her physical activity in August Not worse since it has  increased- she has tried to increase her water intake- she feels like this has helped No painful, but exhausting due to the effort to get them to move Not every day Has noticed some swelling in both of her feet that resolves overnight No ShOB, CP, palpitations No varicose veins, bulging veins in LE Does wear compression stockings  Hypothyroid Diagnosed about 30 years ago Has not been on medication in the recent past Unsure what previous labs have shown, but previous PCP didn't have her on any medication  HLD Tries to watch what she eats Walks 7 days a week for 2-3 miles a day- started in [place] August No medication  Weight Wanting to loose weight- working on exercise and diet Cutting out greasy foods Has lost from 255 to 238  Past Medical History:  Diagnosis Date   Thyroid disease    Past Surgical History:  Procedure Laterality Date   CARPAL TUNNEL RELEASE     Family Status  Relation Name Status   Mother  Deceased   Father  Deceased   Sister  Deceased   Brother  Alive   Other  (Not Specified)   Neg Hx  (Not Specified)   Family History  Problem Relation Age of Onset   Hypertension Mother    Heart disease Mother    Diabetes Mother    Diabetes Sister    Diabetes Brother    Diabetes Other    Breast cancer Neg Hx  Social History   Socioeconomic History   Marital status: Single    Spouse name: Not on file   Number of children: Not on file   Years of education: Not on file   Highest education level: Not on file  Occupational History   Occupation: retired    Comment: Forensic scientist  Tobacco Use   Smoking status: Never   Smokeless tobacco: Never  Vaping Use   Vaping Use: Never used  Substance and Sexual Activity   Alcohol use: No   Drug use: No   Sexual activity: Yes    Birth control/protection: Condom  Other Topics Concern   Not on file  Social History Narrative   Not on file   Social Determinants of Health   Financial Resource Strain: Not on file   Food Insecurity: Not on file  Transportation Needs: Not on file  Physical Activity: Not on file  Stress: Not on file  Social Connections: Not on file   Outpatient Medications Prior to Visit  Medication Sig   ibuprofen (ADVIL,MOTRIN) 800 MG tablet    No facility-administered medications prior to visit.   No Known Allergies  Immunization History  Administered Date(s) Administered   Influenza,inj,Quad PF,6+ Mos 03/12/2021   PFIZER(Purple Top)SARS-COV-2 Vaccination 09/09/2019, 10/04/2019   Pfizer Covid-19 Vaccine Bivalent Booster 47yrs & up 06/12/2020, 01/15/2021   Pneumococcal Conjugate-13 08/25/2018   Td 01/26/2018    Health Maintenance  Topic Date Due   Hepatitis C Screening  Never done   COLONOSCOPY (Pts 45-25yrs Insurance coverage will need to be confirmed)  Never done   Zoster Vaccines- Shingrix (1 of 2) Never done   COVID-19 Vaccine (3 - Booster for Pfizer series) 03/05/2020   MAMMOGRAM  04/26/2022   TETANUS/TDAP  01/27/2028   INFLUENZA VACCINE  Completed   DEXA SCAN  Completed   HPV VACCINES  Aged Out    Patient Care Team: Sarha Bartelt, Sung Amabile, NP as PCP - General (Nurse Practitioner)  Review of Systems All review of systems negative except what is listed in the HPI    Objective    BP (!) 157/80   Pulse 74   Ht 5\' 6"  (1.676 m)   Wt 238 lb 6.4 oz (108.1 kg)   SpO2 99%   BMI 38.48 kg/m  Physical Exam Vitals and nursing note reviewed.  Constitutional:      Appearance: Normal appearance. She is obese.  HENT:     Head: Normocephalic.  Eyes:     Extraocular Movements: Extraocular movements intact.     Conjunctiva/sclera: Conjunctivae normal.     Pupils: Pupils are equal, round, and reactive to light.  Cardiovascular:     Rate and Rhythm: Normal rate and regular rhythm.     Pulses: Normal pulses.     Heart sounds: Normal heart sounds.  Pulmonary:     Effort: Pulmonary effort is normal.     Breath sounds: Normal breath sounds.  Musculoskeletal:         General: No swelling, tenderness, deformity or signs of injury. Normal range of motion.     Right lower leg: No edema.     Left lower leg: No edema.  Skin:    General: Skin is warm and dry.     Capillary Refill: Capillary refill takes less than 2 seconds.     Findings: Rash present.     Comments: Bilateral erythema to intertriginous tissue under breasts with no signs of infection.   Neurological:     General: No focal  deficit present.     Mental Status: She is alert and oriented to person, place, and time.     Sensory: No sensory deficit.     Motor: No weakness.     Coordination: Coordination normal.     Gait: Gait normal.  Psychiatric:        Mood and Affect: Mood normal.        Behavior: Behavior normal.        Thought Content: Thought content normal.        Judgment: Judgment normal.     Depression Screen PHQ 2/9 Scores 03/12/2021  PHQ - 2 Score 0  PHQ- 9 Score 0  Some encounter information is confidential and restricted. Go to Review Flowsheets activity to see all data.   No results found for any visits on 03/12/21.  Assessment & Plan      Problem List Items Addressed This Visit     Hypothyroidism    No recent test results available Has not been on medication in quite some time Will plan to obtain labs in near future with CPE for further evaluation and recommendations No alarm symptoms present today      Mixed hyperlipidemia    Historical- no medications She is exercising daily and watching her diet Will obtain labs with CPE in near future and plan to follow-up based on results.  Recommend low saturated fat diet and increased fiber to help with cholesterol.  Great job on walking and weight loss! Keep this up!      Encounter for medical examination to establish care - Primary    Review of current and past medical history, social history, medication, and family history.  Review of care gaps and health maintenance recommendations.  Records from recent providers to  be requested if not available in Chart Review or Care Everywhere.  Recommendations for health maintenance, diet, and exercise provided.  Labs today: Wait for CPE HM Recommendations: Shingles vaccine, flu vaccine. Obtain colonoscopy records CPE due: Now- follow-up in near future       Leg heaviness    On and off, worse since increased physical activity.  No labs to review at this time. No alarm symptoms present.  Consider possibly thyroid etiology vs dehydration. She does appear dehydrated today and reports limited water intake daily, despite exercise Recommend increase hydration and we will obtain labs in next couple of weeks with CPE      Rash    Symptoms and presentation consistent with moisture associated intertriginous dermatitis Recommend changing out of moist clothes quickly after exercise Use of hydrocortisone OTC cream to the area once a day may be used along with powder to help with moisture Keep area cool and dry No signs of infection present today Plan to follow-up if symptoms worsen or fail to improve.       Other Visit Diagnoses     Need for immunization against influenza       Relevant Orders   Flu Vaccine QUAD High Dose 65+ yrs IM (Fluad)   Flu Vaccine QUAD 67mo+IM (Fluarix, Fluzone & Alfiuria Quad PF) (Completed)   Need for shingles vaccine       Relevant Medications   Zoster Vaccine Adjuvanted [facility]) injection        Return for Next few weeks for CPE and AWV with labs.     Time: 55 minutes, >50% spent counseling, care coordination, chart review, and documentation.   Kiera Hussey, Sung Amabile, NP, DNP, AGNP-C Primary Care & Sports Medicine at  [facility]

## 2021-03-12 NOTE — Assessment & Plan Note (Signed)
Symptoms and presentation consistent with moisture associated intertriginous dermatitis Recommend changing out of moist clothes quickly after exercise Use of hydrocortisone OTC cream to the area once a day may be used along with powder to help with moisture Keep area cool and dry No signs of infection present today Plan to follow-up if symptoms worsen or fail to improve.

## 2021-03-12 NOTE — Assessment & Plan Note (Signed)
On and off, worse since increased physical activity.  No labs to review at this time. No alarm symptoms present.  Consider possibly thyroid etiology vs dehydration. She does appear dehydrated today and reports limited water intake daily, despite exercise Recommend increase hydration and we will obtain labs in next couple of weeks with CPE

## 2021-03-30 ENCOUNTER — Other Ambulatory Visit: Payer: Self-pay

## 2021-03-30 DIAGNOSIS — Z1231 Encounter for screening mammogram for malignant neoplasm of breast: Secondary | ICD-10-CM

## 2021-04-19 ENCOUNTER — Ambulatory Visit (INDEPENDENT_AMBULATORY_CARE_PROVIDER_SITE_OTHER): Payer: Medicare Other | Admitting: Nurse Practitioner

## 2021-04-19 ENCOUNTER — Other Ambulatory Visit: Payer: Self-pay

## 2021-04-19 ENCOUNTER — Encounter (HOSPITAL_BASED_OUTPATIENT_CLINIC_OR_DEPARTMENT_OTHER): Payer: Self-pay | Admitting: Nurse Practitioner

## 2021-04-19 VITALS — BP 140/92 | HR 90 | Temp 98.4°F | Ht 66.0 in | Wt 236.0 lb

## 2021-04-19 DIAGNOSIS — Z13820 Encounter for screening for osteoporosis: Secondary | ICD-10-CM

## 2021-04-19 DIAGNOSIS — Z23 Encounter for immunization: Secondary | ICD-10-CM

## 2021-04-19 DIAGNOSIS — Z1159 Encounter for screening for other viral diseases: Secondary | ICD-10-CM

## 2021-04-19 DIAGNOSIS — Z0000 Encounter for general adult medical examination without abnormal findings: Secondary | ICD-10-CM | POA: Diagnosis not present

## 2021-04-19 DIAGNOSIS — E782 Mixed hyperlipidemia: Secondary | ICD-10-CM | POA: Diagnosis not present

## 2021-04-19 DIAGNOSIS — E039 Hypothyroidism, unspecified: Secondary | ICD-10-CM | POA: Diagnosis not present

## 2021-04-19 DIAGNOSIS — Z1211 Encounter for screening for malignant neoplasm of colon: Secondary | ICD-10-CM

## 2021-04-19 NOTE — Progress Notes (Signed)
Subjective:   Dawn Carlson is a 70 y.o. female who presents for Medicare Annual (Subsequent) preventive examination.  Lives at home with her grandson. She is retired for a year (04/13/2020). She tells me her grandson has untreated schizophrenia and this makes it difficult at times. She reports that he has been on medication in the past and has done well, but since he has been off it has been difficult to get him back on. She tells me she feels safe at home, but she does leave on occasion when she and her grandson fight to try to avoid conflict. She is interested in speaking to someone how she can best help him and would like resources on how to get him help.  She is also the caregiver for her two brothers who do not live with her.   Review of Systems    All ROS negative  Cardiac Risk Factors include: advanced age (>15men, >24 women);obesity (BMI >30kg/m2)     Objective:    Today's Vitals   04/19/21 1029  BP: (!) 140/92  Pulse: 90  Temp: 98.4 F (36.9 C)  SpO2: 98%  Weight: 236 lb (107 kg)  Height: 5\' 6"  (1.676 m)   Body mass index is 38.09 kg/m.  Advanced Directives 04/19/2021 03/13/2017  Does Patient Have a Medical Advance Directive? Yes No  Type of Advance Directive Living will;Healthcare Power of Attorney -  Does patient want to make changes to medical advance directive? Yes (MAU/Ambulatory/Procedural Areas - Information given) -  Copy of Healthcare Power of Attorney in Chart? No - copy requested -    Current Medications (verified) Outpatient Encounter Medications as of 04/19/2021  Medication Sig   ibuprofen (ADVIL,MOTRIN) 800 MG tablet    No facility-administered encounter medications on file as of 04/19/2021.    Allergies (verified) Patient has no known allergies.   History: Past Medical History:  Diagnosis Date   Thyroid disease    Past Surgical History:  Procedure Laterality Date   CARPAL TUNNEL RELEASE     Family History  Problem Relation Age of Onset    Hypertension Mother    Heart disease Mother    Diabetes Mother    Diabetes Sister    Diabetes Brother    Diabetes Other    Schizophrenia Grandson    Breast cancer Neg Hx    Social History   Socioeconomic History   Marital status: Single    Spouse name: Not on file   Number of children: Not on file   Years of education: Not on file   Highest education level: Not on file  Occupational History   Occupation: retired    Comment: 13/08/2020  Tobacco Use   Smoking status: Never   Smokeless tobacco: Never  Vaping Use   Vaping Use: Never used  Substance and Sexual Activity   Alcohol use: No   Drug use: No   Sexual activity: Not Currently    Birth control/protection: Condom  Other Topics Concern   Not on file  Social History Narrative   Lives in her own home with adult grandson.    Divorced for over 30 years   Jehovah's Witness.    Social Determinants of Health   Financial Resource Strain: Low Risk    Difficulty of Paying Living Expenses: Not hard at all  Food Insecurity: No Food Insecurity   Worried About Forensic scientist in the Last Year: Never true   Ran Out of Food in the Last Year: Never  true  Transportation Needs: No Transportation Needs   Lack of Transportation (Medical): No   Lack of Transportation (Non-Medical): No  Physical Activity: Sufficiently Active   Days of Exercise per Week: 7 days   Minutes of Exercise per Session: 40 min  Stress: Stress Concern Present   Feeling of Stress : To some extent  Social Connections: Moderately Integrated   Frequency of Communication with Friends and Family: More than three times a week   Frequency of Social Gatherings with Friends and Family: More than three times a week   Attends Religious Services: More than 4 times per year   Active Member of [employer] or Organizations: Yes   Attends Engineer, structural: More than 4 times per year   Marital Status: Divorced    Tobacco Counseling Counseling given:  Yes   Clinical Intake:  Pre-visit preparation completed: Yes  Pain : No/denies pain     BMI - recorded: 38.09 Nutritional Status: BMI > 30  Obese Nutritional Risks: None Diabetes: No  How often do you need to have someone help you when you read instructions, pamphlets, or other written materials from your doctor or pharmacy?: 1 - Never  Diabetic?No     Information entered by :: S. Theoren Palka   Activities of Daily Living In your present state of health, do you have any difficulty performing the following activities: 04/19/2021  Hearing? N  Vision? N  Difficulty concentrating or making decisions? N  Walking or climbing stairs? N  Dressing or bathing? N  Doing errands, shopping? N  Preparing Food and eating ? N  Using the Toilet? N  In the past six months, have you accidently leaked urine? N  Do you have problems with loss of bowel control? N  Managing your Medications? N  Managing your Finances? N  Housekeeping or managing your Housekeeping? N  Some recent data might be hidden    Patient Care Team: Dorothey Oetken, Sung Amabile, NP as PCP - General (Nurse Practitioner)  Indicate any recent Medical Services you may have received from other than [facility] providers in the past year (date may be approximate).     Assessment:   This is a routine wellness examination for Dawn Carlson.  Hearing/Vision screen No results found.  Dietary issues and exercise activities discussed: Current Exercise Habits: The patient does not participate in regular exercise at present, Exercise limited by: None identified   Goals Addressed             This Visit's Progress    Patient Stated       She would like to work on getting her grandson mental health care and create a stable home environment.  She would like to loose some weight. She would like to increase her water intake.       Depression Screen PHQ 2/9 Scores 04/19/2021 03/12/2021  PHQ - 2 Score 0 0  PHQ- 9 Score 0 0  Some encounter information  is confidential and restricted. Go to Review Flowsheets activity to see all data.    Fall Risk Fall Risk  04/19/2021 03/12/2021  Falls in the past year? 0 0  Number falls in past yr: 0 0  Injury with Fall? 0 0  Risk for fall due to : No Fall Risks No Fall Risks  Follow up Falls evaluation completed;Education provided Falls evaluation completed    FALL RISK PREVENTION PERTAINING TO THE HOME:  Any stairs in or around the home? Yes  If so, are there any without handrails?  No  Home free of loose throw rugs in walkways, pet beds, electrical cords, etc? Yes  Adequate lighting in your home to reduce risk of falls? Yes   ASSISTIVE DEVICES UTILIZED TO PREVENT FALLS:  Life alert? No  Use of a cane, walker or w/c? No  Grab bars in the bathroom? No  Shower chair or bench in shower? Yes  Elevated toilet seat or a handicapped toilet? Yes   TIMED UP AND GO:  Was the test performed? Yes .  Length of time to ambulate 10 feet: 12 sec.   Gait slow and steady without use of assistive device  Cognitive Function:     6CIT Screen 04/19/2021  What Year? 0 points  What month? 0 points  What time? 0 points  Count back from 20 0 points  Months in reverse 0 points  Repeat phrase 0 points  Total Score 0    Immunizations Immunization History  Administered Date(s) Administered   Influenza,inj,Quad PF,6+ Mos 03/12/2021   PFIZER(Purple Top)SARS-COV-2 Vaccination 09/09/2019, 10/04/2019   Pfizer Covid-19 Vaccine Bivalent Booster 67yrs & up 06/12/2020, 01/15/2021   Pneumococcal Conjugate-13 08/25/2018   Pneumococcal Polysaccharide-23 04/19/2021   Td 01/26/2018    TDAP status: Up to date  Flu Vaccine status: Up to date  Pneumococcal vaccine status: Due, Education has been provided regarding the importance of this vaccine. Advised may receive this vaccine at local pharmacy or Health Dept. Aware to provide a copy of the vaccination record if obtained from local pharmacy or Health Dept. Verbalized  acceptance and understanding.  Covid-19 vaccine status: Completed vaccines  Qualifies for Shingles Vaccine? Yes   Zostavax completed No   Shingrix Completed?: No.    Education has been provided regarding the importance of this vaccine. Patient has been advised to call insurance company to determine out of pocket expense if they have not yet received this vaccine. Advised may also receive vaccine at local pharmacy or Health Dept. Verbalized acceptance and understanding.  Screening Tests Health Maintenance  Topic Date Due   Hepatitis C Screening  Never done   COLONOSCOPY (Pts 45-103yrs Insurance coverage will need to be confirmed)  Never done   Zoster Vaccines- Shingrix (1 of 2) Never done   MAMMOGRAM  04/26/2022   TETANUS/TDAP  01/27/2028   Pneumonia Vaccine 9+ Years old  Completed   INFLUENZA VACCINE  Completed   DEXA SCAN  Completed   COVID-19 Vaccine  Completed   HPV VACCINES  Aged Out    Health Maintenance  Health Maintenance Due  Topic Date Due   Hepatitis C Screening  Never done   COLONOSCOPY (Pts 45-69yrs Insurance coverage will need to be confirmed)  Never done   Zoster Vaccines- Shingrix (1 of 2) Never done    Colorectal cancer screening: Referral to GI placed 04/19/2021. Pt aware the office will call re: appt.  Mammogram status: Patient scheduled to complete mammogram on 05/02/2021.   Bone Density status: Completed 09/01/2018. Results reflect: Bone density results: NORMAL. Repeat every 2 years.  Lung Cancer Screening: (Low Dose CT Chest recommended if Age 42-80 years, 30 pack-year currently smoking OR have quit w/in 15years.) does not qualify.   Lung Cancer Screening Referral: No  Additional Screening:  Hepatitis C Screening: does qualify; Completed 04/19/2021  Vision Screening: Recommended annual ophthalmology exams for Makaylyn Sinyard detection of glaucoma and other disorders of the eye. Is the patient up to date with their annual eye exam?  Yes  Who is the provider or  what is the  name of the office in which the patient attends annual eye exams? [facility] If pt is not established with a provider, would they like to be referred to a provider to establish care?  Has established care .   Dental Screening: Recommended annual dental exams for proper oral hygiene  Community Resource Referral / Chronic Care Management: CRR required this visit?  No   CCM required this visit?  No      Plan:     Active- walking 30-45 minutes twice a day (2-3 miles) Pneumonia vaccine today Shingrix declined- cost a prohibitive factor Colonoscopy due- referral placed to GI Eye Exam- [facility]- UTD Mammogram next week Dexa - 2020- ordered  Information provided on [facility] [address]  [phone] She prefers to contact them and let me know if she needs a referral to be seen.    I have personally reviewed and noted the following in the patient's chart:   Medical and social history Use of alcohol, tobacco or illicit drugs  Current medications and supplements including opioid prescriptions.  Functional ability and status Nutritional status Physical activity Advanced directives List of other physicians Hospitalizations, surgeries, and ER visits in previous 12 months Vitals Screenings to include cognitive, depression, and falls Referrals and appointments  In addition, I have reviewed and discussed with patient certain preventive protocols, quality metrics, and best practice recommendations. A written personalized care plan for preventive services as well as general preventive health recommendations were provided to patient.     Tollie Eth, NP   04/19/2021

## 2021-04-19 NOTE — Patient Instructions (Addendum)
Progressive Muscle Relaxation Look this up on youtube and see if it is helpful for relaxation and stress.   If you would like to a referral for counseling, please let me know.   MEDICARE ANNUAL WELLNESS VISIT Health Maintenance Summary and Written Plan of Care  [name] ,  Thank you for allowing me to perform your Medicare Annual Wellness Visit and for your ongoing commitment to your health.   Health Maintenance & Immunization History Health Maintenance  Topic Date Due   Hepatitis C Screening  Never done   COLONOSCOPY (Pts 45-49yrs Insurance coverage will need to be confirmed)  Never done   Zoster Vaccines- Shingrix (1 of 2) Never done   Pneumonia Vaccine 65+ Years old (2 - PPSV23 if available, else PCV20) 08/25/2019   MAMMOGRAM  04/26/2022   TETANUS/TDAP  01/27/2028   INFLUENZA VACCINE  Completed   DEXA SCAN  Completed   COVID-19 Vaccine  Completed   HPV VACCINES  Aged Out   Immunization History  Administered Date(s) Administered   Influenza,inj,Quad PF,6+ Mos 03/12/2021   PFIZER(Purple Top)SARS-COV-2 Vaccination 09/09/2019, 10/04/2019   Pfizer Covid-19 Vaccine Bivalent Booster 12yrs & up 06/12/2020, 01/15/2021   Pneumococcal Conjugate-13 08/25/2018   Td 01/26/2018    These are the patient goals that we discussed:  Goals Addressed             This Visit's Progress    Patient Stated       She would like to work on getting her grandson mental health care and create a stable home environment.  She would like to loose some weight. She would like to increase her water intake.          This is a list of Health Maintenance Items that are overdue or due now: Health Maintenance Due  Topic Date Due   Hepatitis C Screening  Never done   COLONOSCOPY (Pts 45-49yrs Insurance coverage will need to be confirmed)  Never done   Zoster Vaccines- Shingrix (1 of 2) Never done   Pneumonia Vaccine 65+ Years old (2 - PPSV23 if available, else PCV20) 08/25/2019      Orders/Referrals Placed Today: Orders Placed This Encounter  Procedures   DG Bone Density    Standing Status:   Future    Standing Expiration Date:   04/19/2022    Order Specific Question:   Reason for Exam (SYMPTOM  OR DIAGNOSIS REQUIRED)    Answer:   Screening for osteoporosis    Order Specific Question:   Preferred imaging location?    Answer:   [facility]   Pneumococcal polysaccharide vaccine 23-valent greater than or equal to 2yo subcutaneous/IM   Hepatitis C antibody    Standing Status:   Future    Standing Expiration Date:   05/19/2021   CBC with Differential/Platelet    Standing Status:   Future    Standing Expiration Date:   05/19/2021   Comprehensive metabolic panel    Standing Status:   Future    Standing Expiration Date:   05/19/2021    Order Specific Question:   Has the patient fasted?    Answer:   Yes   Hemoglobin A1c    Standing Status:   Future    Standing Expiration Date:   05/19/2021   Lipid panel    Standing Status:   Future    Standing Expiration Date:   05/19/2021    Order Specific Question:   Has the patient fasted?    Answer:     Yes   TSH    Standing Status:   Future    Standing Expiration Date:   05/19/2021   Ambulatory referral to Gastroenterology    Referral Priority:   Routine    Referral Type:   Consultation    Referral Reason:   Specialty Services Required    Requested Specialty:   Gastroenterology    Number of Visits Requested:   1   (Contact our referral department at [phone] if you have not spoken with someone about your referral appointment within the next 5 days)    Follow-up Plan 1 year for AVS 6 months for thyroid   Worthy Keeler, DNP, AGNP-c    Preventive Care 65 Years and Older, Female Preventive care refers to lifestyle choices and visits with your health care provider that can promote health and wellness. This includes: A yearly physical exam. This is also called an annual wellness visit. Regular dental and eye  exams. Immunizations. Screening for certain conditions. Healthy lifestyle choices, such as: Eating a healthy diet. Getting regular exercise. Not using drugs or products that contain nicotine and tobacco. Limiting alcohol use. What can I expect for my preventive care visit? Physical exam Your health care provider will check your: Height and weight. These may be used to calculate your BMI (body mass index). BMI is a measurement that tells if you are at a healthy weight. Heart rate and blood pressure. Body temperature. Skin for abnormal spots. Counseling Your health care provider may ask you questions about your: Past medical problems. Family's medical history. Alcohol, tobacco, and drug use. Emotional well-being. Home life and relationship well-being. Sexual activity. Diet, exercise, and sleep habits. History of falls. Memory and ability to understand (cognition). Work and work Statistician. Pregnancy and menstrual history. Access to firearms. What immunizations do I need? Vaccines are usually given at various ages, according to a schedule. Your health care provider will recommend vaccines for you based on your age, medical history, and lifestyle or other factors, such as travel or where you work. What tests do I need? Blood tests Lipid and cholesterol levels. These may be checked every 5 years, or more often depending on your overall health. Hepatitis C test. Hepatitis B test. Screening Lung cancer screening. You may have this screening every year starting at age 75 if you have a 30-pack-year history of smoking and currently smoke or have quit within the past 15 years. Colorectal cancer screening. All adults should have this screening starting at age 70 and continuing until age 71. Your health care provider may recommend screening at age 56 if you are at increased risk. You will have tests every 1-10 years, depending on your results and the type of screening test. Diabetes  screening. This is done by checking your blood sugar (glucose) after you have not eaten for a while (fasting). You may have this done every 1-3 years. Mammogram. This may be done every 1-2 years. Talk with your health care provider about how often you should have regular mammograms. Abdominal aortic aneurysm (AAA) screening. You may need this if you are a current or former smoker. BRCA-related cancer screening. This may be done if you have a family history of breast, ovarian, tubal, or peritoneal cancers. Other tests STD (sexually transmitted disease) testing, if you are at risk. Bone density scan. This is done to screen for osteoporosis. You may have this done starting at age 44. Talk with your health care provider about your test results, treatment options, and if necessary, the  need for more tests. Follow these instructions at home: Eating and drinking  Eat a diet that includes fresh fruits and vegetables, whole grains, lean protein, and low-fat dairy products. Limit your intake of foods with high amounts of sugar, saturated fats, and salt. Take vitamin and mineral supplements as recommended by your health care provider. Do not drink alcohol if your health care provider tells you not to drink. If you drink alcohol: Limit how much you have to 0-1 drink a day. Be aware of how much alcohol is in your drink. In the [place] one drink equals one 12 oz bottle of beer (355 mL), one 5 oz glass of wine (148 mL), or one 1 oz glass of hard liquor (44 mL). Lifestyle Take daily care of your teeth and gums. Brush your teeth every morning and night with fluoride toothpaste. Floss one time each day. Stay active. Exercise for at least 30 minutes 5 or more days each week. Do not use any products that contain nicotine or tobacco, such as cigarettes, e-cigarettes, and chewing tobacco. If you need help quitting, ask your health care provider. Do not use drugs. If you are sexually active, practice safe sex. Use  a condom or other form of protection in order to prevent STIs (sexually transmitted infections). Talk with your health care provider about taking a low-dose aspirin or statin. Find healthy ways to cope with stress, such as: Meditation, yoga, or listening to music. Journaling. Talking to a trusted person. Spending time with friends and family. Safety Always wear your seat belt while driving or riding in a vehicle. Do not drive: If you have been drinking alcohol. Do not ride with someone who has been drinking. When you are tired or distracted. While texting. Wear a helmet and other protective equipment during sports activities. If you have firearms in your house, make sure you follow all gun safety procedures. What's next? Visit your health care provider once a year for an annual wellness visit. Ask your health care provider how often you should have your eyes and teeth checked. Stay up to date on all vaccines. This information is not intended to replace advice given to you by your health care provider. Make sure you discuss any questions you have with your health care provider. Document Revised: 08/11/2020 Document Reviewed: 05/28/2018 Elsevier Patient Education  2022 [employer].

## 2021-04-20 LAB — COMPREHENSIVE METABOLIC PANEL
ALT: 14 IU/L (ref 0–32)
AST: 20 IU/L (ref 0–40)
Albumin/Globulin Ratio: 1.3 (ref 1.2–2.2)
Albumin: 4.3 g/dL (ref 3.8–4.8)
Alkaline Phosphatase: 104 IU/L (ref 44–121)
BUN/Creatinine Ratio: 14 (ref 12–28)
BUN: 14 mg/dL (ref 8–27)
Bilirubin Total: 0.5 mg/dL (ref 0.0–1.2)
CO2: 22 mmol/L (ref 20–29)
Calcium: 9.6 mg/dL (ref 8.7–10.3)
Chloride: 103 mmol/L (ref 96–106)
Creatinine, Ser: 1.03 mg/dL — ABNORMAL HIGH (ref 0.57–1.00)
Globulin, Total: 3.4 g/dL (ref 1.5–4.5)
Glucose: 94 mg/dL (ref 70–99)
Potassium: 4.6 mmol/L (ref 3.5–5.2)
Sodium: 140 mmol/L (ref 134–144)
Total Protein: 7.7 g/dL (ref 6.0–8.5)
eGFR: 59 mL/min/{1.73_m2} — ABNORMAL LOW (ref >59)

## 2021-04-20 LAB — CBC WITH DIFFERENTIAL/PLATELET
Basophils Absolute: 0.1 10*3/uL (ref 0.0–0.2)
Basos: 1 %
EOS (ABSOLUTE): 0.2 10*3/uL (ref 0.0–0.4)
Eos: 2 %
Hematocrit: 40.2 % (ref 34.0–46.6)
Hemoglobin: 13.9 g/dL (ref 11.1–15.9)
Immature Grans (Abs): 0 10*3/uL (ref 0.0–0.1)
Immature Granulocytes: 0 %
Lymphocytes Absolute: 2.8 10*3/uL (ref 0.7–3.1)
Lymphs: 36 %
MCH: 28.4 pg (ref 26.6–33.0)
MCHC: 34.6 g/dL (ref 31.5–35.7)
MCV: 82 fL (ref 79–97)
Monocytes Absolute: 0.8 10*3/uL (ref 0.1–0.9)
Monocytes: 10 %
Neutrophils Absolute: 4 10*3/uL (ref 1.4–7.0)
Neutrophils: 51 %
Platelets: 257 10*3/uL (ref 150–450)
RBC: 4.89 x10E6/uL (ref 3.77–5.28)
RDW: 13.9 % (ref 11.7–15.4)
WBC: 7.9 10*3/uL (ref 3.4–10.8)

## 2021-04-20 LAB — LIPID PANEL
Chol/HDL Ratio: 5.2 ratio — ABNORMAL HIGH (ref 0.0–4.4)
Cholesterol, Total: 282 mg/dL — ABNORMAL HIGH (ref 100–199)
HDL: 54 mg/dL (ref >39)
LDL Chol Calc (NIH): 202 mg/dL — ABNORMAL HIGH (ref 0–99)
Triglycerides: 143 mg/dL (ref 0–149)
VLDL Cholesterol Cal: 26 mg/dL (ref 5–40)

## 2021-04-20 LAB — HEMOGLOBIN A1C
Est. average glucose Bld gHb Est-mCnc: 123 mg/dL
Hgb A1c MFr Bld: 5.9 % — ABNORMAL HIGH (ref 4.8–5.6)

## 2021-04-20 LAB — HEPATITIS C ANTIBODY: Hep C Virus Ab: <0.1 s/co ratio (ref 0.0–0.9)

## 2021-04-20 LAB — TSH: TSH: 5.21 u[IU]/mL — ABNORMAL HIGH (ref 0.450–4.500)

## 2021-05-02 ENCOUNTER — Other Ambulatory Visit: Payer: Self-pay

## 2021-05-02 ENCOUNTER — Ambulatory Visit
Admission: RE | Admit: 2021-05-02 | Discharge: 2021-05-02 | Disposition: A | Discharge status: Home / Self Care | Payer: PRIVATE HEALTH INSURANCE | Source: Ambulatory Visit | Attending: Nurse Practitioner | Admitting: Nurse Practitioner

## 2021-05-02 DIAGNOSIS — Z1231 Encounter for screening mammogram for malignant neoplasm of breast: Secondary | ICD-10-CM

## 2021-05-08 ENCOUNTER — Encounter (HOSPITAL_BASED_OUTPATIENT_CLINIC_OR_DEPARTMENT_OTHER): Payer: Self-pay | Admitting: Nurse Practitioner

## 2021-05-08 ENCOUNTER — Other Ambulatory Visit: Payer: Self-pay

## 2021-05-08 ENCOUNTER — Ambulatory Visit (INDEPENDENT_AMBULATORY_CARE_PROVIDER_SITE_OTHER): Payer: Medicare Other | Admitting: Nurse Practitioner

## 2021-05-08 VITALS — BP 130/82 | HR 85 | Ht 66.0 in | Wt 232.0 lb

## 2021-05-08 DIAGNOSIS — E782 Mixed hyperlipidemia: Secondary | ICD-10-CM | POA: Diagnosis not present

## 2021-05-08 DIAGNOSIS — L83 Acanthosis nigricans: Secondary | ICD-10-CM

## 2021-05-08 DIAGNOSIS — R7303 Prediabetes: Secondary | ICD-10-CM | POA: Diagnosis not present

## 2021-05-08 DIAGNOSIS — E039 Hypothyroidism, unspecified: Secondary | ICD-10-CM

## 2021-05-08 DIAGNOSIS — Z9189 Other specified personal risk factors, not elsewhere classified: Secondary | ICD-10-CM

## 2021-05-08 DIAGNOSIS — E7849 Other hyperlipidemia: Secondary | ICD-10-CM

## 2021-05-08 DIAGNOSIS — R030 Elevated blood-pressure reading, without diagnosis of hypertension: Secondary | ICD-10-CM

## 2021-05-08 DIAGNOSIS — Z6837 Body mass index (BMI) 37.0-37.9, adult: Secondary | ICD-10-CM

## 2021-05-08 DIAGNOSIS — E8881 Metabolic syndrome: Secondary | ICD-10-CM

## 2021-05-08 MED ORDER — LEVOTHYROXINE SODIUM 50 MCG PO TABS: 50.0000 ug | ORAL_TABLET | Freq: Every day | ORAL | 3 refills | Status: DC

## 2021-05-08 MED ORDER — TIRZEPATIDE 2.5 MG/0.5ML ~~LOC~~ SOAJ: 2.5000 mg | SUBCUTANEOUS | 0 refills | Status: DC

## 2021-05-08 MED ORDER — TIRZEPATIDE 5 MG/0.5ML ~~LOC~~ SOAJ: 5.0000 mg | SUBCUTANEOUS | 1 refills | Status: DC

## 2021-05-08 NOTE — Progress Notes (Signed)
Established Patient Office Visit  Subjective:  Patient ID: Dawn Carlson, female    DOB: 01-May-1951  Age: 70 y.o. MRN: [id number]  CC:  Chief Complaint  Patient presents with   Follow-up    Patient is here to follow up of lab work only.     HPI Dawn Carlson presents for discussion of recent lab results.   Kidney function Creatinine slight elevation with mild decline in GFR. Patient declines any symptoms today  Cholesterol Significantly elevated with suggestion of familial hypercholesteremia present Patient reports recent exercise and diet changes with weight loss and surprise at results.  She does feel that this must be genetic given her family history No symptoms at this time  Hemoglobin A1c Elevated indicating pre-DM Reports that she has been watching her diet very carefully, exercising, and losing weight Endorses frustration over results Symptoms include fatigue, increased appetite, and dry skin  Thyroid Elevated indicating hypothyroidism Reports that she has been on levothyroxine in the past, but stopped and was never restarted Endorses fatigue, weight gain, dry skin, thinning hair, consitpation  Past Medical History:  Diagnosis Date   Rash 03/12/2021   Thyroid disease     Past Surgical History:  Procedure Laterality Date   CARPAL TUNNEL RELEASE      Family History  Problem Relation Age of Onset   Hypertension Mother    Heart disease Mother    Diabetes Mother    Diabetes Sister    Diabetes Brother    Diabetes Other    Schizophrenia Grandson    Breast cancer Neg Hx     Social History   Socioeconomic History   Marital status: Single    Spouse name: Not on file   Number of children: Not on file   Years of education: Not on file   Highest education level: Not on file  Occupational History   Occupation: retired    Comment: Marine scientist  Tobacco Use   Smoking status: Never   Smokeless tobacco: Never  Vaping Use   Vaping Use: Never used   Substance and Sexual Activity   Alcohol use: No   Drug use: No   Sexual activity: Not Currently    Birth control/protection: Condom  Other Topics Concern   Not on file  Social History Narrative   Lives in her own home with adult grandson.    Divorced for over 30 years   Jehovah's Witness.    Social Determinants of Health   Financial Resource Strain: Low Risk    Difficulty of Paying Living Expenses: Not hard at all  Food Insecurity: No Food Insecurity   Worried About Charity fundraiser in the Last Year: Never true   [place] in the Last Year: Never true  Transportation Needs: No Transportation Needs   Lack of Transportation (Medical): No   Lack of Transportation (Non-Medical): No  Physical Activity: Sufficiently Active   Days of Exercise per Week: 7 days   Minutes of Exercise per Session: 40 min  Stress: Stress Concern Present   Feeling of Stress : To some extent  Social Connections: Moderately Integrated   Frequency of Communication with Friends and Family: More than three times a week   Frequency of Social Gatherings with Friends and Family: More than three times a week   Attends Religious Services: More than 4 times per year   Active Member of [employer] or Organizations: Yes   Attends Archivist Meetings: More than 4 times per year  Marital Status: Divorced  Human resources officer Violence: Not At Risk   Fear of Current or Ex-Partner: No   Emotionally Abused: No   Physically Abused: No   Sexually Abused: No    Outpatient Medications Prior to Visit  Medication Sig Dispense Refill   ibuprofen (ADVIL,MOTRIN) 800 MG tablet      No facility-administered medications prior to visit.    No Known Allergies  ROS Review of Systems All review of systems negative except what is listed in the HPI    Objective:    Physical Exam Vitals and nursing note reviewed.  Constitutional:      Appearance: Normal appearance.  HENT:     Head: Normocephalic.  Eyes:      Extraocular Movements: Extraocular movements intact.     Conjunctiva/sclera: Conjunctivae normal.     Pupils: Pupils are equal, round, and reactive to light.  Neck:     Vascular: No carotid bruit.  Cardiovascular:     Rate and Rhythm: Normal rate and regular rhythm.     Pulses: Normal pulses.     Heart sounds: Normal heart sounds.  Pulmonary:     Effort: Pulmonary effort is normal.     Breath sounds: Normal breath sounds.  Musculoskeletal:     Cervical back: Normal range of motion.     Right lower leg: No edema.     Left lower leg: No edema.  Skin:    General: Skin is warm and dry.     Capillary Refill: Capillary refill takes less than 2 seconds.  Neurological:     General: No focal deficit present.     Mental Status: She is alert and oriented to person, place, and time.  Psychiatric:        Mood and Affect: Mood normal.        Behavior: Behavior normal.        Thought Content: Thought content normal.        Judgment: Judgment normal.    BP 130/82   Pulse 85   Ht '5\' 6"'  (1.676 m)   Wt 232 lb (105.2 kg)   SpO2 98%   BMI 37.45 kg/m  Wt Readings from Last 3 Encounters:  05/08/21 232 lb (105.2 kg)  04/19/21 236 lb (107 kg)  03/12/21 238 lb 6.4 oz (108.1 kg)     Health Maintenance Due  Topic Date Due   COLONOSCOPY (Pts 45-23yr Insurance coverage will need to be confirmed)  Never done   Zoster Vaccines- Shingrix (1 of 2) Never done    There are no preventive care reminders to display for this patient.  Lab Results  Component Value Date   TSH 5.210 (H) 04/19/2021   Lab Results  Component Value Date   WBC 7.9 04/19/2021   HGB 13.9 04/19/2021   HCT 40.2 04/19/2021   MCV 82 04/19/2021   PLT 257 04/19/2021   Lab Results  Component Value Date   NA 140 04/19/2021   K 4.6 04/19/2021   CO2 22 04/19/2021   GLUCOSE 94 04/19/2021   BUN 14 04/19/2021   CREATININE 1.03 (H) 04/19/2021   BILITOT 0.5 04/19/2021   ALKPHOS 104 04/19/2021   AST 20 04/19/2021   ALT 14  04/19/2021   PROT 7.7 04/19/2021   ALBUMIN 4.3 04/19/2021   CALCIUM 9.6 04/19/2021   EGFR 59 (L) 04/19/2021   Lab Results  Component Value Date   CHOL 282 (H) 04/19/2021   Lab Results  Component Value Date   HDL 54  04/19/2021   Lab Results  Component Value Date   LDLCALC 202 (H) 04/19/2021   Lab Results  Component Value Date   TRIG 143 04/19/2021   Lab Results  Component Value Date   CHOLHDL 5.2 (H) 04/19/2021   Lab Results  Component Value Date   HGBA1C 5.9 (H) 04/19/2021      Assessment & Plan:   Problem List Items Addressed This Visit     Hypothyroidism    Elevation in TSH on recent labs- not on medication at this time Will restart levothyroxine 48mg daily Take on empty stomach 30 minutes before any food or drink besides water to take medication Will follow-up with labs in 8-12 weeks.       Relevant Medications   levothyroxine (SYNTHROID) 50 MCG tablet   Familial hyperlipidemia, high LDL    Lab reports reveal significant LDL elevation despite weight loss, diet, and exercise.  Discussed the genetic predisposition found in most people with the findings present.  Recommend that we work to get her blood pressure and blood sugar lowered and will plan to start statin therapy at next visit.  Want to avoid too many medication starts in a single visit Patient is agreeable Will f/u in 3 months with repeat labs and education on conditions      Relevant Medications   tirzepatide (MOUNJARO) 2.5 MG/0.5ML Pen   tirzepatide (MOUNJARO) 5 MG/0.5ML Pen   Class 2 severe obesity due to excess calories with serious comorbidity and body mass index (BMI) of 37.0 to 37.9 in adult (HCC) - Primary    BMI 37.45 today Elevated BMI despite diet and exercise management and recent weight loss Most likely insulin resistance and uncontrolled hypothyroidism is contributing to her condition.  Recommend start levothyroxine and mounjaro to control thyroid and insulin resistance. Discussed  the CV benefits of mounjaro and importance on continued diet and exercise management for weight She will follow-up in 3 months      Relevant Medications   tirzepatide (MOUNJARO) 2.5 MG/0.5ML Pen   tirzepatide (MOUNJARO) 5 MG/0.5ML Pen   Blood pressure elevated without history of HTN    BP elevated at last two visits- not significant at this time, but would like to see if lower due to increased CV risk factors with insulin resistance and significant HLD No concerning symptoms present Will work on diet and exercise as well as medication management to see if we can get this under better control.  Goal BP 120/80. Will f/u in 3 months      Relevant Medications   tirzepatide (MOUNJARO) 2.5 MG/0.5ML Pen   tirzepatide (MOUNJARO) 5 MG/0.5ML Pen   Pre-diabetes    Elevated A1c in the setting of severe obesity and HLD Will start mounjaro today for management and reduction in CV risks. Samples provided and coupon card with recommendation to start immediately Warnings discussed and education provided to patient We will f/u in 3 months or sooner if needed for labs and weight check      Relevant Medications   tirzepatide (MOUNJARO) 2.5 MG/0.5ML Pen   tirzepatide (MOUNJARO) 5 MG/0.5ML Pen   Insulin resistance    Evidence of insulin resistance with increased hgb A1c and presence of acanthosis nigricans  Patient has been working hard on diet and exercise and levels are not well controlled Recommend aggressive medication management due to increased risks of CV disease due to elevated BP, Lipids, and BMI.  She is [facility]able [facility] Samples provided as well as coupon code.  Will plan to follow-up in 3 months for repeat labs and see how she is doing      Relevant Medications   tirzepatide (MOUNJARO) 2.5 MG/0.5ML Pen   tirzepatide (MOUNJARO) 5 MG/0.5ML Pen   Acanthosis nigricans    Present on posterior neck in the setting of elevated A1c Will monitor      Relevant Medications    tirzepatide (MOUNJARO) 2.5 MG/0.5ML Pen   tirzepatide (MOUNJARO) 5 MG/0.5ML Pen   At high risk for cardiovascular disease    Insulin resistance, elevated LDL, elevated BMI, and elevated BP readings  Will begin treatment today with mounjaro and continue diet and exercise regimens to help reduce risks Education and information provided to patient.  Will follow-up in 3 months and plan to begin statin therapy at that time- no start today due to start of 2 other medications and want to monitor for interactions/side effects.  Patient agreeable to plan      Relevant Medications   tirzepatide [facility]) 2.5 MG/0.5ML Pen   tirzepatide (MOUNJARO) 5 MG/0.5ML Pen    Meds ordered this encounter  Medications   tirzepatide (MOUNJARO) 2.5 MG/0.5ML Pen    Sig: Inject 2.5 mg into the skin once a week.    Dispense:  2 mL    Refill:  0   tirzepatide (MOUNJARO) 5 MG/0.5ML Pen    Sig: Inject 5 mg into the skin once a week.    Dispense:  6 mL    Refill:  1   levothyroxine (SYNTHROID) 50 MCG tablet    Sig: Take 1 tablet (50 mcg total) by mouth daily.    Dispense:  90 tablet    Refill:  3    Follow-up: Return in about 3 months (around 08/08/2021) for labs, DM, Wt, Thyroid.    Orma Render, NP

## 2021-05-08 NOTE — Assessment & Plan Note (Signed)
Evidence of insulin resistance with increased hgb A1c and presence of acanthosis nigricans  Patient has been working hard on diet and exercise and levels are not well controlled Recommend aggressive medication management due to increased risks of CV disease due to elevated BP, Lipids, and BMI.  She is agreeable to start [facility] today Samples provided as well as coupon code.  Will plan to follow-up in 3 months for repeat labs and see how she is doing

## 2021-05-08 NOTE — Patient Instructions (Signed)
I have sent in a prescription for the medication Mounjaro. This will be used to help lower your blood sugar, loose weight, and get your cholesterol down. You will do one shot once a week.   Be sure to eat smaller portions to help prevent stomach upset/nausea, especially when you first start.   We will check back in about 3 months and see how you are doing.  I have also sent the synthroid for your thyroid. We will recheck the levels when you come back in 3 months.   I have put information on the back about the medication and about Diabetes.    YOU CAN DO THIS!!!! :-)

## 2021-05-08 NOTE — Assessment & Plan Note (Signed)
Present on posterior neck in the setting of elevated A1c Will monitor

## 2021-05-08 NOTE — Assessment & Plan Note (Signed)
BP elevated at last two visits- not significant at this time, but would like to see if lower due to increased CV risk factors with insulin resistance and significant HLD No concerning symptoms present Will work on diet and exercise as well as medication management to see if we can get this under better control.  Goal BP 120/80. Will f/u in 3 months

## 2021-05-08 NOTE — Assessment & Plan Note (Signed)
BMI 37.45 today Elevated BMI despite diet and exercise management and recent weight loss Most likely insulin resistance and uncontrolled hypothyroidism is contributing to her condition.  Recommend start levothyroxine and mounjaro to control thyroid and insulin resistance. Discussed the CV benefits of mounjaro and importance on continued diet and exercise management for weight She will follow-up in 3 months

## 2021-05-08 NOTE — Assessment & Plan Note (Signed)
Insulin resistance, elevated LDL, elevated BMI, and elevated BP readings  Will begin treatment today with mounjaro and continue diet and exercise regimens to help reduce risks Education and information provided to patient.  Will follow-up in 3 months and plan to begin statin therapy at that time- no start today due to start of 2 other medications and want to monitor for interactions/side effects.  Patient agreeable to plan

## 2021-05-08 NOTE — Assessment & Plan Note (Signed)
Lab reports reveal significant LDL elevation despite weight loss, diet, and exercise.  Discussed the genetic predisposition found in most people with the findings present.  Recommend that we work to get her blood pressure and blood sugar lowered and will plan to start statin therapy at next visit.  Want to avoid too many medication starts in a single visit Patient is agreeable Will f/u in 3 months with repeat labs and education on conditions

## 2021-05-08 NOTE — Assessment & Plan Note (Signed)
Elevated A1c in the setting of severe obesity and HLD Will start mounjaro today for management and reduction in CV risks. Samples provided and coupon card with recommendation to start immediately Warnings discussed and education provided to patient We will f/u in 3 months or sooner if needed for labs and weight check

## 2021-05-08 NOTE — Assessment & Plan Note (Signed)
Elevation in TSH on recent labs- not on medication at this time Will restart levothyroxine daily Take on empty stomach 30 minutes before any food or drink besides water to take medication Will follow-up with labs in 8-12 weeks.

## 2021-05-31 ENCOUNTER — Telehealth (HOSPITAL_BASED_OUTPATIENT_CLINIC_OR_DEPARTMENT_OTHER): Payer: Self-pay

## 2021-05-31 NOTE — Telephone Encounter (Signed)
Patient left voicemail that Dawn Carlson is not covered by insurance. I called and left message for patient to call back to let patient know I will send a prior auth to her insurance for approval.

## 2021-06-25 MED ORDER — OZEMPIC (0.25 OR 0.5 MG/DOSE) 2 MG/1.5ML ~~LOC~~ SOPN: PEN_INJECTOR | SUBCUTANEOUS | 0 refills | Status: AC

## 2021-06-25 MED ORDER — OZEMPIC (0.25 OR 0.5 MG/DOSE) 2 MG/1.5ML ~~LOC~~ SOPN: 0.5000 mg | PEN_INJECTOR | SUBCUTANEOUS | 2 refills | Status: DC

## 2021-06-25 MED ORDER — SEMAGLUTIDE (1 MG/DOSE) 4 MG/3ML ~~LOC~~ SOPN: 1.0000 mg | PEN_INJECTOR | SUBCUTANEOUS | 2 refills | Status: DC

## 2021-06-25 NOTE — Addendum Note (Signed)
Addended by: Rebbeca Paul C on: 06/25/2021 12:00 PM   Modules accepted: Orders

## 2021-06-25 NOTE — Telephone Encounter (Signed)
Sent in prescription for ozempic titration

## 2021-07-30 ENCOUNTER — Telehealth (HOSPITAL_BASED_OUTPATIENT_CLINIC_OR_DEPARTMENT_OTHER): Payer: Self-pay | Admitting: Nurse Practitioner

## 2021-07-30 NOTE — Telephone Encounter (Signed)
PATIENT WALKED IN AND WANTED TO TALK TO PROVIDER DISCUSS EXCESS MEDICATION SUPPLY WITHOUT APPOINTMENT. WAS OFFERED TO SCHEDULE BUT PATIENT DECLINED. STATED THAT SHE WANTED TO FIND ANOTHER DOCTOR.

## 2021-08-20 ENCOUNTER — Telehealth (HOSPITAL_BASED_OUTPATIENT_CLINIC_OR_DEPARTMENT_OTHER): Payer: Self-pay

## 2021-08-20 NOTE — Telephone Encounter (Signed)
Patient called with questions regarding medication. I called patient back and LM for her to call me back. ?

## 2021-08-24 ENCOUNTER — Other Ambulatory Visit: Payer: Self-pay

## 2021-08-24 ENCOUNTER — Ambulatory Visit (INDEPENDENT_AMBULATORY_CARE_PROVIDER_SITE_OTHER): Payer: Medicare PPO | Admitting: Family Medicine

## 2021-08-24 ENCOUNTER — Encounter (HOSPITAL_BASED_OUTPATIENT_CLINIC_OR_DEPARTMENT_OTHER): Payer: Self-pay | Admitting: Family Medicine

## 2021-08-24 VITALS — BP 122/82 | HR 75 | Ht 66.0 in | Wt 230.0 lb

## 2021-08-24 DIAGNOSIS — R7303 Prediabetes: Secondary | ICD-10-CM

## 2021-08-24 DIAGNOSIS — E039 Hypothyroidism, unspecified: Secondary | ICD-10-CM | POA: Diagnosis not present

## 2021-08-24 DIAGNOSIS — Z6837 Body mass index (BMI) 37.0-37.9, adult: Secondary | ICD-10-CM

## 2021-08-24 NOTE — Assessment & Plan Note (Signed)
Prior hemoglobin A1c found to be 5.9% ?She has been doing well with Ozempic, discussed potential side effects to be aware of and recommendation of continuing with 1 further week of a 0.5 mg dose and then transitioning to 1 mg weekly dose given that she has been tolerating medication well ?Would plan for follow-up in about 1 month to monitor progress with 1 mg dose or sooner as needed ?

## 2021-08-24 NOTE — Patient Instructions (Addendum)
?  Medication Instructions:  ?Your physician recommends that you continue on your current medications as directed. Please refer to the Current Medication list given to you today. ?--If you need a refill on any your medications before your next appointment, please call your pharmacy first. If no refills are authorized on file call the office.-- ?Lab Work: ?Your physician has recommended that you have lab work today: A1C and TSH ?If you have labs (blood work) drawn today and your tests are completely normal, you will receive your results via [place] a phone call from our staff.  ?Please ensure you check your voicemail in the event that you authorized detailed messages to be left on a delegated number. If you have any lab test that is abnormal or we need to change your treatment, we will call you to review the results. ? ? ?Follow-Up: ?Your next appointment:   ?Your physician recommends that you schedule a follow-up appointment in: 1 month with Dr. de [place] ? ?You will receive a text message or e-mail with a link to a survey about your care and experience with [place] today! We would greatly appreciate your feedback!  ? ?Thanks for letting us be apart of your health journey!!  ?Primary Care and Sports Medicine  ? ?Dr. Kyung Rudd de [place]  ? ?We encourage you to activate your patient portal called "MyChart".  Sign up information is provided on this After Visit Summary.  MyChart is used to connect with patients for Virtual Visits (Telemedicine).  Patients are able to view lab/test results, encounter notes, upcoming appointments, etc.  Non-urgent messages can be sent to your provider as well. To learn more about what you can do with MyChart, please visit --  [email or web address].   ? ?

## 2021-08-24 NOTE — Progress Notes (Signed)
? ? ?Subjective:  ?Patient ID: Dawn Carlson, female    DOB: 11-Jan-1951  Age: 71 y.o. MRN: [id number] ? ?CC:  ?Chief Complaint  ?Patient presents with  ? New Patient (Initial Visit)  ?  Patient presents today for clairifiaction of medication she received 6 boxes of Ozempic and was very confused on how to take. She stated she has take 4 weeks of .25mg  and now is on the 3rd week of .50mg  ?  ? ? ?HPI ?Dawn Carlson is a 71 year old female previously seen by Lonzo Cloud Early who is presenting for evaluation and discussion regarding chronic medical issues and medications. ? ?Prediabetes/obesity: At prior office visit, hemoglobin A1c which was found to be in prediabetes range was discussed with patient as well as management options.  She was started on Ozempic and initially had some confusions as her mail order pharmacy sent several different dosages to her out of order and she became confused regarding which order to be taking these medications in.  Thus far she has taken 4 weeks of 0.25 mg dose and tolerated this well and has transitioned to the 0.5 mg dose.  She has been doing the 0.5 mg dose for 3 weeks, has been tolerating well, no side effects noted.  He is unsure about when to transition to new dose and at what dose to do. ? ?She was also found to have slightly elevated TSH.  Due to this, she was started on levothyroxine 50 mcg.  She has been taking this since about the end of November.  She reports that she has been doing well with this medication.  Denies any temperature intolerance, no significant fatigue or other systemic symptoms. ? ?Past Medical History:  ?Diagnosis Date  ? Rash 03/12/2021  ? Thyroid disease   ? ? ?Past Surgical History:  ?Procedure Laterality Date  ? CARPAL TUNNEL RELEASE    ? ? ?Family History  ?Problem Relation Age of Onset  ? Hypertension Mother   ? Heart disease Mother   ? Diabetes Mother   ? Diabetes Sister   ? Diabetes Brother   ? Diabetes Other   ? Schizophrenia Grandson   ? Breast  cancer Neg Hx   ? ? ?Social History  ? ?Socioeconomic History  ? Marital status: Single  ?  Spouse name: Not on file  ? Number of children: Not on file  ? Years of education: Not on file  ? Highest education level: Not on file  ?Occupational History  ? Occupation: retired  ?  Comment: [employer]  ?Tobacco Use  ? Smoking status: Never  ? Smokeless tobacco: Never  ?Vaping Use  ? Vaping Use: Never used  ?Substance and Sexual Activity  ? Alcohol use: No  ? Drug use: No  ? Sexual activity: Not Currently  ?  Birth control/protection: Condom  ?Other Topics Concern  ? Not on file  ?Social History Narrative  ? Lives in her own home with adult grandson.   ? Divorced for over 30 years  ? Jehovah's Witness.   ? ?Social Determinants of Health  ? ?Financial Resource Strain: Low Risk   ? Difficulty of Paying Living Expenses: Not hard at all  ?Food Insecurity: No Food Insecurity  ? Worried About December in the Last Year: Never true  ? Ran Out of Food in the Last Year: Never true  ?Transportation Needs: No Transportation Needs  ? Lack of Transportation (Medical): No  ? Lack of Transportation (Non-Medical): No  ?Physical Activity: Sufficiently  Active  ? Days of Exercise per Week: 7 days  ? Minutes of Exercise per Session: 40 min  ?Stress: Stress Concern Present  ? Feeling of Stress : To some extent  ?Social Connections: Moderately Integrated  ? Frequency of Communication with Friends and Family: More than three times a week  ? Frequency of Social Gatherings with Friends and Family: More than three times a week  ? Attends Religious Services: More than 4 times per year  ? Active Member of Clubs or Organizations: Yes  ? Attends Banker Meetings: More than 4 times per year  ? Marital Status: Divorced  ?Intimate Partner Violence: Not At Risk  ? Fear of Current or Ex-Partner: No  ? Emotionally Abused: No  ? Physically Abused: No  ? Sexually Abused: No  ? ? ?Objective:  ? ?Today's Vitals: BP 122/82   Pulse 75    Ht 5\' 6"  (1.676 m)   Wt 230 lb (104.3 kg)   SpO2 97%   BMI 37.12 kg/m?  ? ?Physical Exam ? ?72 year old female in no acute distress ?Normal respiratory effort, no labored breathing ? ?Assessment & Plan:  ? ?Problem List Items Addressed This Visit   ? ?  ? Endocrine  ? Hypothyroidism - Primary  ?  Given that she has been on current dose of levothyroxine for about 3 months, we will proceed with checking TSH to assess current response to levothyroxine and whether any dose adjustment is needed. ?She did not have T4 checked at time of diagnosis, uncertain if patient had true hypothyroidism or possible subclinical hypothyroidism.  On chart review, it does appear that patient was symptomatic at the time and thus being on levothyroxine is reasonable ?  ?  ? Relevant Orders  ? TSH  ?  ? Other  ? Class 2 severe obesity due to excess calories with serious comorbidity and body mass index (BMI) of 37.0 to 37.9 in adult [facility])  ?  She continues with Ozempic, has been tolerating well, see above for medication management ?  ?  ? Relevant Orders  ? Hemoglobin A1c  ? Pre-diabetes  ?  Prior hemoglobin A1c found to be 5.9% ?She has been doing well with Ozempic, discussed potential side effects to be aware of and recommendation of continuing with 1 further week of a 0.5 mg dose and then transitioning to 1 mg weekly dose given that she has been tolerating medication well ?Would plan for follow-up in about 1 month to monitor progress with 1 mg dose or sooner as needed ?  ?  ? Relevant Orders  ? Hemoglobin A1c  ? ? ?Outpatient Encounter Medications as of 08/24/2021  ?Medication Sig  ? ibuprofen (ADVIL,MOTRIN) 800 MG tablet   ? levothyroxine (SYNTHROID) 50 MCG tablet Take 1 tablet (50 mcg total) by mouth daily.  ? Semaglutide, 1 MG/DOSE, 4 MG/3ML SOPN Inject 1 mg as directed once a week.  ? Semaglutide,0.25 or 0.5MG /DOS, (OZEMPIC, 0.25 OR 0.5 MG/DOSE,) 2 MG/1.5ML SOPN Inject 0.5 mg into the skin once a week.  ? ?No facility-administered  encounter medications on file as of 08/24/2021.  ? ? ?Follow-up: Return in about 1 month (around 09/24/2021).  Plan for follow-up in about 1 month to monitor progress with Ozempic, review labs ? ?Melis Trochez J De 11/24/2021, MD ? ?

## 2021-08-24 NOTE — Assessment & Plan Note (Signed)
Given that she has been on current dose of levothyroxine for about 3 months, we will proceed with checking TSH to assess current response to levothyroxine and whether any dose adjustment is needed. ?She did not have T4 checked at time of diagnosis, uncertain if patient had true hypothyroidism or possible subclinical hypothyroidism.  On chart review, it does appear that patient was symptomatic at the time and thus being on levothyroxine is reasonable ?

## 2021-08-24 NOTE — Assessment & Plan Note (Signed)
She continues with Ozempic, has been tolerating well, see above for medication management ?

## 2021-08-29 ENCOUNTER — Other Ambulatory Visit: Payer: Self-pay

## 2021-08-29 ENCOUNTER — Ambulatory Visit (HOSPITAL_BASED_OUTPATIENT_CLINIC_OR_DEPARTMENT_OTHER): Payer: 59

## 2021-08-30 LAB — HEMOGLOBIN A1C
Est. average glucose Bld gHb Est-mCnc: 123 mg/dL
Hgb A1c MFr Bld: 5.9 % — ABNORMAL HIGH (ref 4.8–5.6)

## 2021-08-30 LAB — TSH: TSH: 3.79 u[IU]/mL (ref 0.450–4.500)

## 2021-09-26 ENCOUNTER — Ambulatory Visit (HOSPITAL_BASED_OUTPATIENT_CLINIC_OR_DEPARTMENT_OTHER): Payer: 59 | Admitting: Family Medicine

## 2021-10-03 ENCOUNTER — Ambulatory Visit (INDEPENDENT_AMBULATORY_CARE_PROVIDER_SITE_OTHER): Payer: Medicare PPO | Admitting: Family Medicine

## 2021-10-03 ENCOUNTER — Encounter (HOSPITAL_BASED_OUTPATIENT_CLINIC_OR_DEPARTMENT_OTHER): Payer: Self-pay | Admitting: Family Medicine

## 2021-10-03 VITALS — BP 130/65 | HR 79 | Temp 97.7°F | Ht 66.0 in | Wt 228.8 lb

## 2021-10-03 DIAGNOSIS — R7303 Prediabetes: Secondary | ICD-10-CM

## 2021-10-03 DIAGNOSIS — E039 Hypothyroidism, unspecified: Secondary | ICD-10-CM

## 2021-10-03 DIAGNOSIS — Z6837 Body mass index (BMI) 37.0-37.9, adult: Secondary | ICD-10-CM

## 2021-10-03 NOTE — Patient Instructions (Signed)
Exercising to Stay Healthy To become healthy and stay healthy, it is recommended that you do moderate-intensity and vigorous-intensity exercise. You can tell that you are exercising at a moderate intensity if your heart starts beating faster and you start breathing faster but can still hold a conversation. You can tell that you are exercising at a vigorous intensity if you are breathing much harder and faster and cannot hold a conversation while exercising. How can exercise benefit me? Exercising regularly is important. It has many health benefits, such as: Improving overall fitness, flexibility, and endurance. Increasing bone density. Helping with weight control. Decreasing body fat. Increasing muscle strength and endurance. Reducing stress and tension, anxiety, depression, or anger. Improving overall health. What guidelines should I follow while exercising? Before you start a new exercise program, talk with your health care provider. Do not exercise so much that you hurt yourself, feel dizzy, or get very short of breath. Wear comfortable clothes and wear shoes with good support. Drink plenty of water while you exercise to prevent dehydration or heat stroke. Work out until your breathing and your heartbeat get faster (moderate intensity). How often should I exercise? Choose an activity that you enjoy, and set realistic goals. Your health care provider can help you make an activity plan that is individually designed and works best for you. Exercise regularly as told by your health care provider. This may include: Doing strength training two times a week, such as: Lifting weights. Using resistance bands. Push-ups. Sit-ups. Yoga. Doing a certain intensity of exercise for a given amount of time. Choose from these options: A total of 150 minutes of moderate-intensity exercise every week. A total of 75 minutes of vigorous-intensity exercise every week. A mix of moderate-intensity and  vigorous-intensity exercise every week. Children, pregnant women, people who have not exercised regularly, people who are overweight, and older adults may need to talk with a health care provider about what activities are safe to perform. If you have a medical condition, be sure to talk with your health care provider before you start a new exercise program. What are some exercise ideas? Moderate-intensity exercise ideas include: Walking 1 mile (1.6 km) in about 15 minutes. Biking. Hiking. Golfing. Dancing. Water aerobics. Vigorous-intensity exercise ideas include: Walking 4.5 miles (7.2 km) or more in about 1 hour. Jogging or running 5 miles (8 km) in about 1 hour. Biking 10 miles (16.1 km) or more in about 1 hour. Lap swimming. Roller-skating or in-line skating. Cross-country skiing. Vigorous competitive sports, such as football, basketball, and soccer. Jumping rope. Aerobic dancing. What are some everyday activities that can help me get exercise? Yard work, such as: Pushing a lawn mower. Raking and bagging leaves. Washing your car. Pushing a stroller. Shoveling snow. Gardening. Washing windows or floors. How can I be more active in my day-to-day activities? Use stairs instead of an elevator. Take a walk during your lunch break. If you drive, park your car farther away from your work or school. If you take public transportation, get off one stop early and walk the rest of the way. Stand up or walk around during all of your indoor phone calls. Get up, stretch, and walk around every 30 minutes throughout the day. Enjoy exercise with a friend. Support to continue exercising will help you keep a regular routine of activity. Where to find more information You can find more information about exercising to stay healthy from: U.S. Department of Health and Human Services: www.hhs.gov Centers for Disease Control and Prevention (  CDC): www.cdc.gov Summary Exercising regularly is  important. It will improve your overall fitness, flexibility, and endurance. Regular exercise will also improve your overall health. It can help you control your weight, reduce stress, and improve your bone density. Do not exercise so much that you hurt yourself, feel dizzy, or get very short of breath. Before you start a new exercise program, talk with your health care provider. This information is not intended to replace advice given to you by your health care provider. Make sure you discuss any questions you have with your health care provider. Document Revised: 09/29/2020 Document Reviewed: 09/29/2020 Elsevier Patient Education  2023 Elsevier Inc.  

## 2021-10-03 NOTE — Progress Notes (Signed)
? ? ?  Procedures performed today:   ? ?None. ? ?Independent interpretation of notes and tests performed by another provider:  ? ?None. ? ?Brief History, Exam, Impression, and Recommendations:   ? ?BP 130/65   Pulse 79   Temp 97.7 ?F (36.5 ?C)   Ht 5\' 6"  (1.676 m)   Wt 228 lb 12.8 oz (103.8 kg)   SpO2 99%   BMI 36.93 kg/m?  ? ?Class 2 severe obesity due to excess calories with serious comorbidity and body mass index (BMI) of 37.0 to 37.9 in adult [facility]) ?Patient continues with Ozempic, however she has remained at 0.5 mg weekly dose and has not titrated to the 1 mg dose.  She does indicate having some nausea, particularly for the first couple days after administering medication and then this will resolve ?Recent check of hemoglobin A1c indicates that this has been stable, remains at 5.9% ?Discussed medication management with patient today, can continue with 0.5 mg dose of Ozempic over the next couple weeks and then look to transition to 1 mg dose.  It is possible that side effects including nausea and vomiting could keep her from being able to tolerate higher, more effective doses and we may need to discontinue the medication ?Plan to assess at follow-up visit in 6 weeks ? ?Hypothyroidism ?She indicates that she has been doing well with levothyroxine 50 mcg daily ?Recent check of TSH shows that this has returned to normal range with present levothyroxine dose ?Plan to continue with current levothyroxine dose and plan to recheck TSH in 3 to 4 months ? ?Plan for follow-up in about 6 weeks ? ? ?___________________________________________ ?Leonilda Cozby de [place], MD, ABFM, CAQSM ?Primary Care and Sports Medicine ?[place] ?

## 2021-10-03 NOTE — Assessment & Plan Note (Signed)
She indicates that she has been doing well with levothyroxine 50 mcg daily ?Recent check of TSH shows that this has returned to normal range with present levothyroxine dose ?Plan to continue with current levothyroxine dose and plan to recheck TSH in 3 to 4 months ?

## 2021-10-03 NOTE — Assessment & Plan Note (Addendum)
Patient continues with Ozempic, however she has remained at 0.5 mg weekly dose and has not titrated to the 1 mg dose.  She does indicate having some nausea, particularly for the first couple days after administering medication and then this will resolve ?Recent check of hemoglobin A1c indicates that this has been stable, remains at 5.9% ?Discussed medication management with patient today, can continue with 0.5 mg dose of Ozempic over the next couple weeks and then look to transition to 1 mg dose.  It is possible that side effects including nausea and vomiting could keep her from being able to tolerate higher, more effective doses and we may need to discontinue the medication ?Plan to assess at follow-up visit in 6 weeks ?

## 2021-10-17 ENCOUNTER — Ambulatory Visit (HOSPITAL_BASED_OUTPATIENT_CLINIC_OR_DEPARTMENT_OTHER): Payer: PRIVATE HEALTH INSURANCE | Admitting: Nurse Practitioner

## 2021-11-09 ENCOUNTER — Encounter (HOSPITAL_COMMUNITY): Payer: Self-pay | Admitting: Emergency Medicine

## 2021-11-09 ENCOUNTER — Ambulatory Visit (HOSPITAL_COMMUNITY)
Admission: EM | Admit: 2021-11-09 | Discharge: 2021-11-09 | Disposition: A | Discharge status: Home / Self Care | Payer: 59 | Attending: Emergency Medicine | Admitting: Emergency Medicine

## 2021-11-09 DIAGNOSIS — M25561 Pain in right knee: Secondary | ICD-10-CM

## 2021-11-09 MED ORDER — KETOROLAC TROMETHAMINE 30 MG/ML IJ SOLN
INTRAMUSCULAR | Status: AC
  Filled 2021-11-09: qty 1

## 2021-11-09 MED ORDER — NAPROXEN SODIUM 550 MG PO TABS: 550.0000 mg | ORAL_TABLET | Freq: Two times a day (BID) | ORAL | 0 refills | Status: DC

## 2021-11-09 MED ORDER — KETOROLAC TROMETHAMINE 30 MG/ML IJ SOLN
30.0000 mg | Freq: Once | INTRAMUSCULAR | Status: AC
  Administered 2021-11-09: 30 mg via INTRAMUSCULAR

## 2021-11-09 NOTE — ED Provider Notes (Signed)
[facility]    CSN: [id number] Arrival date & time: 11/09/21  0802      History   Chief Complaint Chief Complaint  Patient presents with   Knee Pain    HPI Dawn Carlson is a 71 y.o. female.   Patient presents with right knee pain and swelling for 1 day.  Endorses that her knee feels weak as if it will give out while walking.  Has been painful to bear weight.  Range of motion intact.  Has attempted use of ice and compression which has been somewhat helpful.  Endorses that swelling has mildly improved today.  No change in day-to-day activity, typically goes for a walk every morning.  History of osteoarthritis in bilateral knees.    Past Medical History:  Diagnosis Date   Rash 03/12/2021   Thyroid disease     Patient Active Problem List   Diagnosis Date Noted   Class 2 severe obesity due to excess calories with serious comorbidity and body mass index (BMI) of 37.0 to 37.9 in adult (HCC) 05/08/2021   Blood pressure elevated without history of HTN 05/08/2021   Pre-diabetes 05/08/2021   Insulin resistance 05/08/2021   Acanthosis nigricans 05/08/2021   At high risk for cardiovascular disease 05/08/2021   Encounter for medical examination to establish care 03/12/2021   Leg heaviness 03/12/2021   Familial hyperlipidemia, 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   Tinnitus, bilateral 08/25/2018   Hypothyroidism 04/07/2006   DENTAL CARIES, SEVERE 04/07/2006   HYSTERECTOMY, HX OF 04/07/2006   CARPAL TUNNEL RELEASE, HX OF 04/07/2006    Past Surgical History:  Procedure Laterality Date   CARPAL TUNNEL RELEASE      OB History   No obstetric history on file.      Home Medications    Prior to Admission medications   Medication Sig Start Date End Date Taking? Authorizing Provider  ibuprofen (ADVIL,MOTRIN) 800 MG tablet  08/04/17   [provider]  levothyroxine (SYNTHROID) 50 MCG tablet Take 1 tablet (50 mcg total) by mouth daily. 05/08/21   Tollie Eth, NP  Semaglutide,  1 MG/DOSE, 4 MG/3ML SOPN Inject 1 mg as directed once a week. 06/25/21   Tollie Eth, NP  Semaglutide,0.25 or 0.5MG /DOS, (OZEMPIC, 0.25 OR 0.5 MG/DOSE,) 2 MG/1.5ML SOPN Inject 0.5 mg into the skin once a week. 06/25/21   Early, Sung Amabile, NP    Family History Family History  Problem Relation Age of Onset   Hypertension Mother    Heart disease Mother    Diabetes Mother    Diabetes Sister    Diabetes Brother    Diabetes Other    Schizophrenia Grandson    Breast cancer Neg Hx     Social History Social History   Tobacco Use   Smoking status: Never   Smokeless tobacco: Never  Vaping Use   Vaping Use: Never used  Substance Use Topics   Alcohol use: No   Drug use: No     Allergies   Patient has no known allergies.   Review of Systems Review of Systems  Constitutional: Negative.   Respiratory: Negative.    Cardiovascular: Negative.   Musculoskeletal:  Positive for joint swelling and myalgias. Negative for arthralgias, back pain, gait problem, neck pain and neck stiffness.  Skin: Negative.   Neurological: Negative.     Physical Exam Triage Vital Signs ED Triage Vitals  Enc Vitals Group     BP 11/09/21 0821 (!) 158/75     Pulse Rate 11/09/21  4235 64     Resp 11/09/21 0821 18     Temp 11/09/21 0821 97.9 F (36.6 C)     Temp Source 11/09/21 0821 Oral     SpO2 11/09/21 0821 96 %     Weight --      Height --      Head Circumference --      Peak Flow --      Pain Score 11/09/21 0819 10     Pain Loc --      Pain Edu? --      Excl. in GC? --    No data found.  Updated Vital Signs BP (!) 158/75 (BP Location: Right Arm)   Pulse 64   Temp 97.9 F (36.6 C) (Oral)   Resp 18   SpO2 96%   Visual Acuity Right Eye Distance:   Left Eye Distance:   Bilateral Distance:    Right Eye Near:   Left Eye Near:    Bilateral Near:     Physical Exam Constitutional:      Appearance: Normal appearance.  HENT:     Head: Normocephalic.  Eyes:     Extraocular Movements:  Extraocular movements intact.  Pulmonary:     Effort: Pulmonary effort is normal.  Musculoskeletal:     Comments: Mild to moderate anterior knee swelling and tenderness along the joint space without effusion, range of motion intact, able to bear weight, 2+ popliteal pulse, no ligament laxity noted  Neurological:     Mental Status: She is alert and oriented to person, place, and time. Mental status is at baseline.  Psychiatric:        Mood and Affect: Mood normal.        Behavior: Behavior normal.     UC Treatments / Results  Labs (all labs ordered are listed, but only abnormal results are displayed) Labs Reviewed - No data to display  EKG   Radiology No results found.  Procedures Procedures (including critical care time)  Medications Ordered in UC Medications - No data to display  Initial Impression / Assessment and Plan / UC Course  I have reviewed the triage vital signs and the nursing notes.  Pertinent labs & imaging results that were available during my care of the patient were reviewed by me and considered in my medical decision making (see chart for details).  Acute right knee pain  Low suspicion for injury to the tendons or ligaments or bone as patient symptoms did not occur from injury, will defer imaging today, etiology is most likely a arthritic flareup, will move forward with treatment as such, Toradol injection given in office, naproxen twice daily for 5 days prescribed for outpatient treatment plan as needed, advised patient to continue RICE, may use heat, pillows for support, daily stretching and activity as tolerated, given walker referral to orthopedics if symptoms continue to persist or recur for further evaluation and management Final Clinical Impressions(s) / UC Diagnoses   Final diagnoses:  None   Discharge Instructions   None    ED Prescriptions   None    PDMP not reviewed this encounter.   Valinda Hoar, [place] 11/09/21 [phone]

## 2021-11-09 NOTE — ED Triage Notes (Signed)
Pt c/o right knee pain that started yesterday. Reports knee went weak and about gave out on her. Reports that noticed swelling and elevated and iced yesterday. Wearing a brace this morning Reports that she noticed a tick on her last week.

## 2021-11-09 NOTE — Discharge Instructions (Addendum)
Your symptoms today are most likely a flare of your osteoarthritis  You may use heating pad in 15 minute intervals as needed for additional comfort or you may find comfort in using ice in 10-15 minutes over affected area  May continue to wear compression to add stability and support  Begin stretching affected area daily for 10 minutes as tolerated to further loosen muscles   When lying down place pillow underneath and between knees for support  If pain persist after recommended treatment or reoccurs if may be beneficial to follow up with orthopedic specialist for evaluation, this doctor specializes in the bones and can manage your symptoms long-term with options such as but not limited to imaging, medications or physical therapy

## 2021-11-14 ENCOUNTER — Encounter (HOSPITAL_BASED_OUTPATIENT_CLINIC_OR_DEPARTMENT_OTHER): Payer: Self-pay | Admitting: Family Medicine

## 2021-11-14 ENCOUNTER — Ambulatory Visit (INDEPENDENT_AMBULATORY_CARE_PROVIDER_SITE_OTHER): Payer: Medicare PPO | Admitting: Family Medicine

## 2021-11-14 VITALS — BP 159/92 | HR 71 | Ht 66.0 in | Wt 230.2 lb

## 2021-11-14 DIAGNOSIS — M25561 Pain in right knee: Secondary | ICD-10-CM

## 2021-11-14 DIAGNOSIS — R7303 Prediabetes: Secondary | ICD-10-CM | POA: Diagnosis not present

## 2021-11-14 DIAGNOSIS — Z6837 Body mass index (BMI) 37.0-37.9, adult: Secondary | ICD-10-CM

## 2021-11-14 MED ORDER — SEMAGLUTIDE (1 MG/DOSE) 4 MG/3ML ~~LOC~~ SOPN: 1.0000 mg | PEN_INJECTOR | SUBCUTANEOUS | 0 refills | Status: DC

## 2021-11-14 NOTE — Assessment & Plan Note (Signed)
Managing with Ozempic - see above for further details Continue with lifestyle modifications

## 2021-11-14 NOTE — Assessment & Plan Note (Signed)
Since last visit, patient has been continuing with Ozempic, currently administering 1 mg dose.  Has been doing this for a few weeks and feels that it has been going well.  She does have some nausea occasionally, no vomiting.  She does have 1 pen remaining at home of 1 mg dose.  She feels that she has noticed some weight loss at home on her home scale, indicates about a 6 pound weight loss.  Feels that she is getting benefits from the Ozempic at 1 mg dose.  She would like to continue with 1 mg dose for now to allow for further adjustment to side effects.  Prescription sent to pharmacy for 4-week supply and we will reassess symptoms in 6 weeks and determine at that time if we will titrate dose further to 2 mg dose.

## 2021-11-14 NOTE — Progress Notes (Signed)
    Procedures performed today:    None.  Independent interpretation of notes and tests performed by another provider:   None.  Brief History, Exam, Impression, and Recommendations:    BP (!) 159/92   Pulse 71   Ht 5\' 6"  (1.676 m)   Wt 230 lb 3.2 oz (104.4 kg)   SpO2 100%   BMI 37.16 kg/m   Pre-diabetes Since last visit, patient has been continuing with Ozempic, currently administering 1 mg dose.  Has been doing this for a few weeks and feels that it has been going well.  She does have some nausea occasionally, no vomiting.  She does have 1 pen remaining at home of 1 mg dose.  She feels that she has noticed some weight loss at home on her home scale, indicates about a 6 pound weight loss.  Feels that she is getting benefits from the Ozempic at 1 mg dose.  She would like to continue with 1 mg dose for now to allow for further adjustment to side effects.  Prescription sent to pharmacy for 4-week supply and we will reassess symptoms in 6 weeks and determine at that time if we will titrate dose further to 2 mg dose.  Class 2 severe obesity due to excess calories with serious comorbidity and body mass index (BMI) of 37.0 to 37.9 in adult [facility]) Managing with Ozempic - see above for further details Continue with lifestyle modifications  Right knee pain Patient did have recent visit to urgent care due to right knee pain and swelling.  This visit was about 5 days ago.  Patient with known osteoarthritis in bilateral knees.  At urgent care, she was treated for acute flareup of underlying OA and received a Toradol injection, recommended to proceed with naproxen twice daily as needed to help with symptoms and provided with referral to orthopedic surgery if needed.  Today, she indicates that her knee pain is improved since visit to urgent care and feels that the medications have been helpful. If pain does return, would consider proceeding with initial x-ray imaging and discussion of further treatment  options including alternative medications, possible injections, physical therapy  Return in about 6 weeks (around 12/26/2021) for PreDM, med management.   ___________________________________________ Dawn Carlson de 02/26/2022, MD, ABFM, [facility] Primary Care and Sports Medicine [facility]

## 2021-11-14 NOTE — Assessment & Plan Note (Signed)
Patient did have recent visit to urgent care due to right knee pain and swelling.  This visit was about 5 days ago.  Patient with known osteoarthritis in bilateral knees.  At urgent care, she was treated for acute flareup of underlying OA and received a Toradol injection, recommended to proceed with naproxen twice daily as needed to help with symptoms and provided with referral to orthopedic surgery if needed.  Today, she indicates that her knee pain is improved since visit to urgent care and feels that the medications have been helpful. If pain does return, would consider proceeding with initial x-ray imaging and discussion of further treatment options including alternative medications, possible injections, physical therapy

## 2021-12-26 ENCOUNTER — Ambulatory Visit (HOSPITAL_BASED_OUTPATIENT_CLINIC_OR_DEPARTMENT_OTHER): Payer: 59 | Admitting: Family Medicine

## 2022-01-06 ENCOUNTER — Ambulatory Visit (HOSPITAL_COMMUNITY)
Admission: EM | Admit: 2022-01-06 | Discharge: 2022-01-06 | Disposition: A | Discharge status: Home / Self Care | Payer: 59 | Attending: Internal Medicine | Admitting: Internal Medicine

## 2022-01-06 ENCOUNTER — Encounter (HOSPITAL_COMMUNITY): Payer: Self-pay

## 2022-01-06 DIAGNOSIS — J688 Other respiratory conditions due to chemicals, gases, fumes and vapors: Secondary | ICD-10-CM

## 2022-01-06 DIAGNOSIS — J392 Other diseases of pharynx: Secondary | ICD-10-CM | POA: Diagnosis not present

## 2022-01-06 DIAGNOSIS — Z77098 Contact with and (suspected) exposure to other hazardous, chiefly nonmedicinal, chemicals: Secondary | ICD-10-CM | POA: Diagnosis not present

## 2022-01-06 MED ORDER — ALBUTEROL SULFATE HFA 108 (90 BASE) MCG/ACT IN AERS
2.0000 | INHALATION_SPRAY | Freq: Once | RESPIRATORY_TRACT | Status: AC
Start: 2022-01-06 — End: 2022-01-06
  Administered 2022-01-06: 2 via RESPIRATORY_TRACT

## 2022-01-06 MED ORDER — CETIRIZINE HCL 10 MG PO TABS: 10.0000 mg | ORAL_TABLET | Freq: Every day | ORAL | 2 refills | Status: DC

## 2022-01-06 MED ORDER — ALBUTEROL SULFATE HFA 108 (90 BASE) MCG/ACT IN AERS: 1.0000 | INHALATION_SPRAY | Freq: Four times a day (QID) | RESPIRATORY_TRACT | 0 refills | Status: DC | PRN

## 2022-01-06 MED ORDER — ALBUTEROL SULFATE HFA 108 (90 BASE) MCG/ACT IN AERS
INHALATION_SPRAY | RESPIRATORY_TRACT | Status: AC
  Filled 2022-01-06: qty 6.7

## 2022-01-06 NOTE — ED Provider Notes (Signed)
[facility]    CSN: [id number] Arrival date & time: 01/06/22  1427      History   Chief Complaint Chief Complaint  Patient presents with   sinus issues     HPI Dawn Carlson is a 71 y.o. female.   Patient presents to urgent care for evaluation of dry cough and throat irritation after she began to use Clorox cleaning product to her toilet.  Patient states that she placed a Clorox white large cleaning tablet into her toilet bowl 5 or 6 days ago and began to notice throat irritation and dry cough for the next 3 days after she placed this in her toilet bowl.  She removed the tablet from her toilet bowl 2 days ago and states that her symptoms have persisted.  She placed this into the toilet bowl in her master bedroom and has been inhaling the strong smell from the cleaning product ever since then.  Patient denies prior history of respiratory problems, asthma, allergies, or COPD.  She is not a smoker and does not use drugs or drink alcohol.  She denies shortness of breath, chest pain, dizziness, throat swelling, difficulty swallowing, watery itchy eyes, headache, and nausea/vomiting.  No fever or chills reported.  She is reporting that her throat feels mildly "scratchy".  She has not attempted use of any over-the-counter medications prior to arrival to urgent care for her symptoms.     Past Medical History:  Diagnosis Date   Rash 03/12/2021   Thyroid disease     Patient Active Problem List   Diagnosis Date Noted   Right knee pain 11/14/2021   Class 2 severe obesity due to excess calories with serious comorbidity and body mass index (BMI) of 37.0 to 37.9 in adult (HCC) 05/08/2021   Blood pressure elevated without history of HTN 05/08/2021   Pre-diabetes 05/08/2021   Insulin resistance 05/08/2021   Acanthosis nigricans 05/08/2021   At high risk for cardiovascular disease 05/08/2021   Encounter for medical examination to establish care 03/12/2021   Leg heaviness 03/12/2021    Familial hyperlipidemia, 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   Tinnitus, bilateral 08/25/2018   Hypothyroidism 04/07/2006   DENTAL CARIES, SEVERE 04/07/2006   HYSTERECTOMY, HX OF 04/07/2006   CARPAL TUNNEL RELEASE, HX OF 04/07/2006    Past Surgical History:  Procedure Laterality Date   CARPAL TUNNEL RELEASE      OB History   No obstetric history on file.      Home Medications    Prior to Admission medications   Medication Sig Start Date End Date Taking? Authorizing Provider  albuterol (VENTOLIN HFA) 108 (90 Base) MCG/ACT inhaler Inhale 1-2 puffs into the lungs every 6 (six) hours as needed for wheezing or shortness of breath. 01/06/22  Yes Macintyre Alexa, Donavan Burnet, FNP  cetirizine (ZYRTEC) 10 MG tablet Take 1 tablet (10 mg total) by mouth daily. 01/06/22  Yes Carlisle Beers, FNP  ibuprofen (ADVIL,MOTRIN) 800 MG tablet  08/04/17   [provider]  levothyroxine (SYNTHROID) 50 MCG tablet Take 1 tablet (50 mcg total) by mouth daily. 05/08/21   Tollie Eth, NP  naproxen sodium (ANAPROX DS) 550 MG tablet Take 1 tablet (550 mg total) by mouth 2 (two) times daily with a meal. 11/09/21   White, Elita Boone, NP  Semaglutide, 1 MG/DOSE, 4 MG/3ML SOPN Inject 1 mg as directed once a week. 11/14/21   de [place], Raymond J, MD    Family History Family History  Problem Relation Age of Onset  Hypertension Mother    Heart disease Mother    Diabetes Mother    Diabetes Sister    Diabetes Brother    Diabetes Other    Schizophrenia Grandson    Breast cancer Neg Hx     Social History Social History   Tobacco Use   Smoking status: Never   Smokeless tobacco: Never  Vaping Use   Vaping Use: Never used  Substance Use Topics   Alcohol use: No   Drug use: No     Allergies   Patient has no known allergies.   Review of Systems Review of Systems Per HPI  Physical Exam Triage Vital Signs ED Triage Vitals [01/06/22 1550]  Enc Vitals Group     BP      Pulse      Resp      Temp       Temp src      SpO2      Weight      Height      Head Circumference      Peak Flow      Pain Score 5     Pain Loc      Pain Edu?      Excl. in GC?    No data found.  Updated Vital Signs There were no vitals taken for this visit.  Visual Acuity Right Eye Distance:   Left Eye Distance:   Bilateral Distance:    Right Eye Near:   Left Eye Near:    Bilateral Near:     Physical Exam Vitals and nursing note reviewed.  Constitutional:      Appearance: Normal appearance. She is not ill-appearing or toxic-appearing.     Comments: Very pleasant patient sitting on exam in position of comfort table in no acute distress.   HENT:     Head: Normocephalic and atraumatic.     Right Ear: Hearing, tympanic membrane, ear canal and external ear normal.     Left Ear: Hearing, tympanic membrane, ear canal and external ear normal.     Nose: Nose normal.     Mouth/Throat:     Lips: Pink.     Mouth: Mucous membranes are moist.     Pharynx: No posterior oropharyngeal erythema.     Comments: Airway is intact.  No throat swelling present to visualization of posterior oropharynx. Eyes:     General: Lids are normal. Vision grossly intact. Gaze aligned appropriately.        Right eye: No discharge.        Left eye: No discharge.     Extraocular Movements: Extraocular movements intact.     Conjunctiva/sclera: Conjunctivae normal.  Cardiovascular:     Rate and Rhythm: Normal rate and regular rhythm.     Heart sounds: Normal heart sounds, S1 normal and S2 normal.  Pulmonary:     Effort: Pulmonary effort is normal. No respiratory distress.     Breath sounds: Normal breath sounds and air entry.     Comments: No adventitious lung sounds heard to auscultation of all lung fields.  No respiratory distress. Abdominal:     Palpations: Abdomen is soft.  Musculoskeletal:     Cervical back: Normal range of motion and neck supple. No tenderness.  Lymphadenopathy:     Cervical: No cervical adenopathy.   Skin:    General: Skin is warm and dry.     Capillary Refill: Capillary refill takes less than 2 seconds.     Findings: No rash.  Neurological:     General: No focal deficit present.     Mental Status: She is alert and oriented to person, place, and time. Mental status is at baseline.     Cranial Nerves: No dysarthria or facial asymmetry.     Motor: No weakness.     Gait: Gait is intact. Gait normal.  Psychiatric:        Mood and Affect: Mood normal.        Speech: Speech normal.        Behavior: Behavior normal.        Thought Content: Thought content normal.        Judgment: Judgment normal.      UC Treatments / Results  Labs (all labs ordered are listed, but only abnormal results are displayed) Labs Reviewed - No data to display  EKG   Radiology No results found.  Procedures Procedures (including critical care time)  Medications Ordered in UC Medications  albuterol (VENTOLIN HFA) 108 (90 Base) MCG/ACT inhaler 2 puff (has no administration in time range)    Initial Impression / Assessment and Plan / UC Course  I have reviewed the triage vital signs and the nursing notes.  Pertinent labs & imaging results that were available during my care of the patient were reviewed by me and considered in my medical decision making (see chart for details).  Throat irritation and chemical exposure Symptomology and physical exam are likely related to inhalation of Clorox cleaning tablet.  Patient prescribed albuterol inhaler 1 to 2 puffs every 4-6 hours as needed for throat and lung irritation as well as cough.  Patient given 1 to 2 puffs in the clinic prior to leaving urgent care and provided education on albuterol inhaler use.  Cetirizine once daily to clear up scratchy throat related to inhaled irritant.  Patient to open window in her bedroom and circulate air in her house to help to reduce the amount of Clorox in the air remaining and get fresh clean air into her home so that her  symptoms improved.  She has been instructed never to use these Clorox cleaning tablets again.  Deferred imaging based on stable cardiopulmonary exam and stable vital signs at this time.  Patient agreeable to this plan.  Strict ED return precautions discussed and given.  Discussed physical exam and available lab work findings in clinic with patient.  Counseled patient regarding appropriate use of medications and potential side effects for all medications recommended or prescribed today. Discussed red flag signs and symptoms of worsening condition,when to call the PCP office, return to urgent care, and when to seek higher level of care in the emergency department. Patient verbalizes understanding and agreement with plan. All questions answered. Patient discharged in stable condition.  Final Clinical Impressions(s) / UC Diagnoses   Final diagnoses:  Throat irritation  Chemical exposure     Discharge Instructions      Your symptoms are likely related to recent inhalation of the Clorox cleaning tablet.  As discussed, please open up your window in your bedroom and let your bathroom/bedroom air out.  Turn the fan on and leave the window open for a few hours to allow air to circulate to remove strong odor and chemical from the air.   Take cetirizine 10 mg once daily to help with scratchy itchy throat. Use albuterol inhaler 1 to 2 puffs every 4 hours to open up your airways, relieve irritation, and relieve your cough.  Rinse your mouth out with water  after using the albuterol inhaler.   Return to urgent care as needed or if you develop any new or worsening symptoms.  If your symptoms are severe, please call 911 or go to the ER.  I hope you feel better!       ED Prescriptions     Medication Sig Dispense Auth. Provider   albuterol (VENTOLIN HFA) 108 (90 Base) MCG/ACT inhaler Inhale 1-2 puffs into the lungs every 6 (six) hours as needed for wheezing or shortness of breath. 8 g Reita May  M, FNP   cetirizine (ZYRTEC) 10 MG tablet Take 1 tablet (10 mg total) by mouth daily. 30 tablet Carlisle Beers, FNP      PDMP not reviewed this encounter.   Carlisle Beers, [place] 01/06/22 [phone]

## 2022-01-06 NOTE — ED Triage Notes (Signed)
Pt presents to UC with co of throat irritation, nasal irritation, and chest tightness for 3 days following exposure to new cleaning product in home. Pt st she bought a Clorox tab for the toilet that was really strong in smell and seemed to start this but pt is not sure.

## 2022-01-06 NOTE — Discharge Instructions (Addendum)
Your symptoms are likely related to recent inhalation of the Clorox cleaning tablet.  As discussed, please open up your window in your bedroom and let your bathroom/bedroom air out.  Turn the fan on and leave the window open for a few hours to allow air to circulate to remove strong odor and chemical from the air.   Take cetirizine 10 mg once daily to help with scratchy itchy throat. Use albuterol inhaler 1 to 2 puffs every 4 hours to open up your airways, relieve irritation, and relieve your cough.  Rinse your mouth out with water after using the albuterol inhaler.   Return to urgent care as needed or if you develop any new or worsening symptoms.  If your symptoms are severe, please call 911 or go to the ER.  I hope you feel better!

## 2022-03-28 ENCOUNTER — Ambulatory Visit (INDEPENDENT_AMBULATORY_CARE_PROVIDER_SITE_OTHER): Payer: Medicare PPO | Admitting: Nurse Practitioner

## 2022-03-28 ENCOUNTER — Encounter (HOSPITAL_BASED_OUTPATIENT_CLINIC_OR_DEPARTMENT_OTHER): Payer: Self-pay | Admitting: Nurse Practitioner

## 2022-03-28 VITALS — BP 138/85 | HR 78 | Ht 66.0 in | Wt 232.7 lb

## 2022-03-28 DIAGNOSIS — W57XXXA Bitten or stung by nonvenomous insect and other nonvenomous arthropods, initial encounter: Secondary | ICD-10-CM | POA: Diagnosis not present

## 2022-03-28 DIAGNOSIS — S30861A Insect bite (nonvenomous) of abdominal wall, initial encounter: Secondary | ICD-10-CM

## 2022-03-28 MED ORDER — HYDROXYZINE PAMOATE 25 MG PO CAPS: 25.0000 mg | ORAL_CAPSULE | Freq: Three times a day (TID) | ORAL | 0 refills | Status: DC | PRN

## 2022-03-28 MED ORDER — TRIAMCINOLONE ACETONIDE 0.1 % EX CREA: 1.0000 | TOPICAL_CREAM | Freq: Two times a day (BID) | CUTANEOUS | 1 refills | Status: DC

## 2022-03-28 MED ORDER — DOXYCYCLINE HYCLATE 100 MG PO TABS: 100.0000 mg | ORAL_TABLET | Freq: Two times a day (BID) | ORAL | 0 refills | Status: DC

## 2022-03-28 NOTE — Patient Instructions (Signed)
I have sent in Doxycycline for you to take twice a day for three days for the tick bite prophylactic.   I have sent in hydroxyzine 25mg  for you to take at bedtime for the itching. You can take it during the day if the itching is intense, but it will make you sleepy, so use caution with this. The itching should improve with the use of the doxycycline.   I have sent in a cream (triamcinolone) that you can use for itching during the day. You can mix about an inch of the cream with your normal body lotion and apply this to your neck and upper chest where you are itching. You can use this twice a day.   If you have any concerns, please do not hesitate to let me know.   The skin glue will come off on it's own in about 7-10 days. If you notice any redness, warmth, or drainage from the area, let me know immediately.

## 2022-03-28 NOTE — Progress Notes (Signed)
Dawn Render, DNP, AGNP-c Primary Care & Sports Medicine [address]  [place], [place] [address] [phone]  Subjective:   Dawn Carlson is a 71 y.o. female presents to day for evaluation of: Acute Visit (Patient presents today for tick bite x 2 months. Not sure tick is completely out. Real itchy. )  Tick Bite Dawn Carlson tells me that she was bitten by a tick on the right side of her abdomen about 2 months ago. She endorses continued pruritis to the area with slow to heal wound from the tick. She is unsure how long the tick was present, but feels like it could have been a couple of days. Her daughter removed the tick, but she is not sure if the head was also removed.  She denies fever, chills, rash, weakness, muscle pain, dizziness, ShOB, or other symptoms at this time.   PMH, Medications, and Allergies reviewed and updated in chart as appropriate.   ROS negative except for what is listed in HPI. Objective:  BP 138/85   Pulse 78   Ht 5\' 6"  (1.676 m)   Wt 232 lb 11.2 oz (105.6 kg)   SpO2 99%   BMI 37.56 kg/m  Physical Exam Vitals and nursing note reviewed.  Constitutional:      Appearance: Normal appearance.  HENT:     Head: Normocephalic.  Eyes:     Extraocular Movements: Extraocular movements intact.     Pupils: Pupils are equal, round, and reactive to light.  Cardiovascular:     Rate and Rhythm: Normal rate and regular rhythm.     Pulses: Normal pulses.     Heart sounds: Normal heart sounds.  Pulmonary:     Effort: Pulmonary effort is normal.     Breath sounds: Normal breath sounds.  Musculoskeletal:     Cervical back: Normal range of motion.     Right lower leg: No edema.     Left lower leg: No edema.  Lymphadenopathy:     Cervical: No cervical adenopathy.  Skin:    General: Skin is warm and dry.     Capillary Refill: Capillary refill takes less than 2 seconds.     Findings: Lesion present.  Neurological:     Mental Status:  She is alert and oriented to person, place, and time.           Assessment & Plan:   Problem List Items Addressed This Visit     Tick bite of abdominal wall - Primary    Appx 79mm area of erythema present to the right abdominal wall with no signs of drainage present. Area firm to touch. Concern for remnants of tick under the skin causing difficulty of healing. Patient verbally consented to exploration of the site. Area cleansed and draped in sterile fashion. 0.3cc 1% lidocaine with epinephrine infiltrated into tissue surrounding lesion creating a block. 59mm incision made with #11 blade. Forceps utilized to move the tissue for examination. Approximate 1-88mm dark foreign matter removed from area with small amount of purulent matter surrounding. Suspect this was part of the tick. Area rinsed with sterile saline and no further foreign matter or drainage noted. Incision closed with skin adhesive. Minimal blood loss. Patient tolerated well. Will send doxycycline to pharmacy for prophylactic treatment given the presence of foreign body and small amount of purulence. Instructions to return if she has any signs of infection, inflammation, drainage, redness, or worsening pruritis. She expressed understanding.       Relevant  Medications   hydrOXYzine (VISTARIL) 25 MG capsule   triamcinolone cream (KENALOG) 0.1 %      Dawn Render, DNP, AGNP-c 04/10/2022  8:58 PM    History, Medications, Surgery, SDOH, and Family History reviewed and updated as appropriate.

## 2022-04-04 DIAGNOSIS — H40013 Open angle with borderline findings, low risk, bilateral: Secondary | ICD-10-CM | POA: Diagnosis not present

## 2022-04-08 ENCOUNTER — Other Ambulatory Visit: Payer: Self-pay | Admitting: Nurse Practitioner

## 2022-04-08 DIAGNOSIS — Z1231 Encounter for screening mammogram for malignant neoplasm of breast: Secondary | ICD-10-CM

## 2022-04-09 ENCOUNTER — Encounter (HOSPITAL_BASED_OUTPATIENT_CLINIC_OR_DEPARTMENT_OTHER): Payer: 59

## 2022-04-09 DIAGNOSIS — Z0000 Encounter for general adult medical examination without abnormal findings: Secondary | ICD-10-CM | POA: Diagnosis not present

## 2022-04-10 LAB — LIPID PANEL
Chol/HDL Ratio: 5.1 ratio — ABNORMAL HIGH (ref 0.0–4.4)
Cholesterol, Total: 278 mg/dL — ABNORMAL HIGH (ref 100–199)
HDL: 54 mg/dL (ref >39)
LDL Chol Calc (NIH): 195 mg/dL — ABNORMAL HIGH (ref 0–99)
Triglycerides: 158 mg/dL — ABNORMAL HIGH (ref 0–149)
VLDL Cholesterol Cal: 29 mg/dL (ref 5–40)

## 2022-04-10 LAB — COMPREHENSIVE METABOLIC PANEL
ALT: 14 IU/L (ref 0–32)
AST: 18 IU/L (ref 0–40)
Albumin/Globulin Ratio: 1.3 (ref 1.2–2.2)
Albumin: 4.3 g/dL (ref 3.9–4.9)
Alkaline Phosphatase: 103 IU/L (ref 44–121)
BUN/Creatinine Ratio: 16 (ref 12–28)
BUN: 17 mg/dL (ref 8–27)
Bilirubin Total: 0.4 mg/dL (ref 0.0–1.2)
CO2: 24 mmol/L (ref 20–29)
Calcium: 9.3 mg/dL (ref 8.7–10.3)
Chloride: 102 mmol/L (ref 96–106)
Creatinine, Ser: 1.07 mg/dL — ABNORMAL HIGH (ref 0.57–1.00)
Globulin, Total: 3.3 g/dL (ref 1.5–4.5)
Glucose: 99 mg/dL (ref 70–99)
Potassium: 4.4 mmol/L (ref 3.5–5.2)
Sodium: 139 mmol/L (ref 134–144)
Total Protein: 7.6 g/dL (ref 6.0–8.5)
eGFR: 56 mL/min/{1.73_m2} — ABNORMAL LOW (ref >59)

## 2022-04-10 LAB — CBC WITH DIFFERENTIAL/PLATELET
Basophils Absolute: 0.1 10*3/uL (ref 0.0–0.2)
Basos: 1 %
EOS (ABSOLUTE): 0.2 10*3/uL (ref 0.0–0.4)
Eos: 3 %
Hematocrit: 40.6 % (ref 34.0–46.6)
Hemoglobin: 13.9 g/dL (ref 11.1–15.9)
Immature Grans (Abs): 0 10*3/uL (ref 0.0–0.1)
Immature Granulocytes: 0 %
Lymphocytes Absolute: 2.4 10*3/uL (ref 0.7–3.1)
Lymphs: 34 %
MCH: 29 pg (ref 26.6–33.0)
MCHC: 34.2 g/dL (ref 31.5–35.7)
MCV: 85 fL (ref 79–97)
Monocytes Absolute: 0.7 10*3/uL (ref 0.1–0.9)
Monocytes: 11 %
Neutrophils Absolute: 3.6 10*3/uL (ref 1.4–7.0)
Neutrophils: 51 %
Platelets: 237 10*3/uL (ref 150–450)
RBC: 4.79 x10E6/uL (ref 3.77–5.28)
RDW: 14.4 % (ref 11.7–15.4)
WBC: 7 10*3/uL (ref 3.4–10.8)

## 2022-04-10 LAB — TSH+FREE T4
Free T4: 1.09 ng/dL (ref 0.82–1.77)
TSH: 6.83 u[IU]/mL — ABNORMAL HIGH (ref 0.450–4.500)

## 2022-04-10 LAB — HEMOGLOBIN A1C
Est. average glucose Bld gHb Est-mCnc: 128 mg/dL
Hgb A1c MFr Bld: 6.1 % — ABNORMAL HIGH (ref 4.8–5.6)

## 2022-04-10 NOTE — Assessment & Plan Note (Addendum)
Appx 24mm area of erythema present to the right abdominal wall with no signs of drainage present. Area firm to touch. Concern for remnants of tick under the skin causing difficulty of healing. Patient verbally consented to exploration of the site. Area cleansed and draped in sterile fashion. 0.3cc 1% lidocaine with epinephrine infiltrated into tissue surrounding lesion creating a block. 80mm incision made with #11 blade. Forceps utilized to move the tissue for examination. Approximate 1-70mm dark foreign matter removed from area with small amount of purulent matter surrounding. Suspect this was part of the tick. Area rinsed with sterile saline and no further foreign matter or drainage noted. Incision closed with skin adhesive. Minimal blood loss. Patient tolerated well. Will send doxycycline to pharmacy for prophylactic treatment given the presence of foreign body and small amount of purulence. Triamcinolone provided for use around the area as needed if itching continues. Instructions to return if she has any signs of infection, inflammation, drainage, redness, or worsening pruritis. She expressed understanding.

## 2022-04-16 ENCOUNTER — Encounter (HOSPITAL_BASED_OUTPATIENT_CLINIC_OR_DEPARTMENT_OTHER): Payer: Self-pay | Admitting: Family Medicine

## 2022-04-16 ENCOUNTER — Ambulatory Visit (INDEPENDENT_AMBULATORY_CARE_PROVIDER_SITE_OTHER): Payer: Medicare PPO | Admitting: Family Medicine

## 2022-04-16 VITALS — BP 157/86 | HR 67 | Temp 97.6°F | Ht 66.0 in | Wt 235.5 lb

## 2022-04-16 DIAGNOSIS — E039 Hypothyroidism, unspecified: Secondary | ICD-10-CM

## 2022-04-16 DIAGNOSIS — Z0000 Encounter for general adult medical examination without abnormal findings: Secondary | ICD-10-CM | POA: Diagnosis not present

## 2022-04-16 DIAGNOSIS — Z23 Encounter for immunization: Secondary | ICD-10-CM

## 2022-04-16 MED ORDER — SHINGRIX 50 MCG/0.5ML IM SUSR: 0.5000 mL | Freq: Once | INTRAMUSCULAR | 0 refills | Status: AC

## 2022-04-16 NOTE — Patient Instructions (Signed)
  Medication Instructions:  Your physician recommends that you continue on your current medications as directed. Please refer to the Current Medication list given to you today. --If you need a refill on any your medications before your next appointment, please call your pharmacy first. If no refills are authorized on file call the office.-- Lab Work: Your physician has recommended that you have lab work today: No If you have labs (blood work) drawn today and your tests are completely normal, you will receive your results via [place] a phone call from our staff.  Please ensure you check your voicemail in the event that you authorized detailed messages to be left on a delegated number. If you have any lab test that is abnormal or we need to change your treatment, we will call you to review the results.  Referrals/Procedures/Imaging: No  Follow-Up: Your next appointment:   Your physician recommends that you schedule a follow-up appointment in: 3 months with Dr. de [place]. Nurse visit for labs a few days before follow-up.   You will receive a text message or e-mail with a link to a survey about your care and experience with [place] today! We would greatly appreciate your feedback!   Thanks for letting us be apart of your health journey!!  Primary Care and Sports Medicine   Dr. Arlina Robes [place]   We encourage you to activate your patient portal called "MyChart".  Sign up information is provided on this After Visit Summary.  MyChart is used to connect with patients for Virtual Visits (Telemedicine).  Patients are able to view lab/test results, encounter notes, upcoming appointments, etc.  Non-urgent messages can be sent to your provider as well. To learn more about what you can do with MyChart, please visit --  [email or web address].

## 2022-04-16 NOTE — Progress Notes (Signed)
Subjective:    CC: Annual Physical Exam  HPI:  Dawn Carlson is a 71 y.o. presenting for annual physical  I reviewed the past medical history, family history, social history, surgical history, and allergies today and no changes were needed.  Please see the problem list section below in epic for further details.  Past Medical History: Past Medical History:  Diagnosis Date   Rash 03/12/2021   Thyroid disease    Past Surgical History: Past Surgical History:  Procedure Laterality Date   CARPAL TUNNEL RELEASE     Social History: Social History   Socioeconomic History   Marital status: Single    Spouse name: Not on file   Number of children: Not on file   Years of education: Not on file   Highest education level: Not on file  Occupational History   Occupation: retired    Comment: Forensic scientist  Tobacco Use   Smoking status: Never   Smokeless tobacco: Never  Vaping Use   Vaping Use: Never used  Substance and Sexual Activity   Alcohol use: No   Drug use: No   Sexual activity: Not Currently    Birth control/protection: Condom  Other Topics Concern   Not on file  Social History Narrative   Lives in her own home with adult grandson.    Divorced for over 30 years   Jehovah's Witness.    Social Determinants of Health   Financial Resource Strain: Low Risk  (04/19/2021)   Overall Financial Resource Strain (CARDIA)    Difficulty of Paying Living Expenses: Not hard at all  Food Insecurity: No Food Insecurity (04/19/2021)   Hunger Vital Sign    Worried About Running Out of Food in the Last Year: Never true    Ran Out of Food in the Last Year: Never true  Transportation Needs: No Transportation Needs (04/19/2021)   PRAPARE - Administrator, Civil Service (Medical): No    Lack of Transportation (Non-Medical): No  Physical Activity: Sufficiently Active (04/19/2021)   Exercise Vital Sign    Days of Exercise per Week: 7 days    Minutes of Exercise per Session: 40 min   Stress: Stress Concern Present (04/19/2021)   [employer] of Occupational Health - Occupational Stress Questionnaire    Feeling of Stress : To some extent  Social Connections: Moderately Integrated (04/19/2021)   Social Connection and Isolation Panel [NHANES]    Frequency of Communication with Friends and Family: More than three times a week    Frequency of Social Gatherings with Friends and Family: More than three times a week    Attends Religious Services: More than 4 times per year    Active Member of [employer] or Organizations: Yes    Attends Engineer, structural: More than 4 times per year    Marital Status: Divorced   Family History: Family History  Problem Relation Age of Onset   Hypertension Mother    Heart disease Mother    Diabetes Mother    Diabetes Sister    Diabetes Brother    Diabetes Other    Schizophrenia Grandson    Breast cancer Neg Hx    Allergies: No Known Allergies Medications: See med rec.  Review of Systems: No headache, visual changes, nausea, vomiting, diarrhea, constipation, dizziness, abdominal pain, skin rash, fevers, chills, night sweats, swollen lymph nodes, weight loss, chest pain, body aches, joint swelling, muscle aches, shortness of breath, mood changes, visual or auditory hallucinations.  Objective:  BP (!) 157/86   Pulse 67   Temp 97.6 F (36.4 C) (Oral)   Ht 5\' 6"  (1.676 m)   Wt 235 lb 8 oz (106.8 kg)   SpO2 100%   BMI 38.01 kg/m   General: Well Developed, well nourished, and in no acute distress. Neuro: Alert and oriented x3, extra-ocular muscles intact, sensation grossly intact. Cranial nerves II through XII are intact, motor, sensory, and coordinative functions are all intact. HEENT: Normocephalic, atraumatic, pupils equal round reactive to light, neck supple, no masses, no lymphadenopathy, thyroid nonpalpable. Oropharynx, nasopharynx, external ear canals are unremarkable. Skin: Warm and dry, no rashes noted. Cardiac:  Regular rate and rhythm, no murmurs rubs or gallops. Respiratory: Clear to auscultation bilaterally. Not using accessory muscles, speaking in full sentences. Abdominal: Soft, nontender, nondistended, positive bowel sounds, no masses, no organomegaly. Musculoskeletal: Shoulder, elbow, wrist, hip, knee, ankle stable, and with full range of motion.  Impression and Recommendations:    Wellness examination Routine HCM labs reviewed. HCM reviewed/discussed. Anticipatory guidance regarding healthy weight, lifestyle and choices given. Recommend healthy diet.  Recommend approximately 150 minutes/week of moderate intensity exercise Recommend regular dental and vision exams Always use seatbelt/lap and shoulder restraints Recommend using smoke alarms and checking batteries at least twice a year Recommend using sunscreen when outside Discussed colon cancer screening recommendations, options.  Patient reports being UTD Discussed recommendations for shingles vaccine.  Patient amenable, Rx sent to pharmacy Discussed tetanus immunization recommendations, patient is UTD She is UTD with seasonal flu vaccine  Recent TSH slightly elevated.  Discussed this with patient, free T4 was normal.  She is currently on levothyroxine.  Discussed that given her age, slight elevation in TSH is not concerning.  Would recommend continuing with monitoring and maintaining current dose of levothyroxine.  Plan for close follow-up in about 2 to 3 months to continue monitoring, plan to recheck thyroid studies before next appointment  Return in about 3 months (around 07/17/2022) for hypothyroidism, preDM.   ___________________________________________ Avni Traore de [place], MD, ABFM, [facility] and [place]

## 2022-04-16 NOTE — Assessment & Plan Note (Signed)
Routine HCM labs reviewed. HCM reviewed/discussed. Anticipatory guidance regarding healthy weight, lifestyle and choices given. Recommend healthy diet.  Recommend approximately 150 minutes/week of moderate intensity exercise Recommend regular dental and vision exams Always use seatbelt/lap and shoulder restraints Recommend using smoke alarms and checking batteries at least twice a year Recommend using sunscreen when outside Discussed colon cancer screening recommendations, options.  Patient reports being UTD Discussed recommendations for shingles vaccine.  Patient amenable, Rx sent to pharmacy Discussed tetanus immunization recommendations, patient is UTD She is UTD with seasonal flu vaccine

## 2022-05-06 IMAGING — MG MM DIGITAL SCREENING BILAT W/ TOMO AND CAD
8 series · 8 of 24 positions shown · non-contrast
Comparison: Previous exam(s).

CLINICAL DATA: Screening.

EXAM:
DIGITAL SCREENING BILATERAL MAMMOGRAM WITH TOMOSYNTHESIS AND CAD
TECHNIQUE: Bilateral screening digital craniocaudal and mediolateral oblique
mammograms were obtained. Bilateral screening digital breast
tomosynthesis was performed. The images were evaluated with
computer-aided detection.

[L MLO synth-2D]
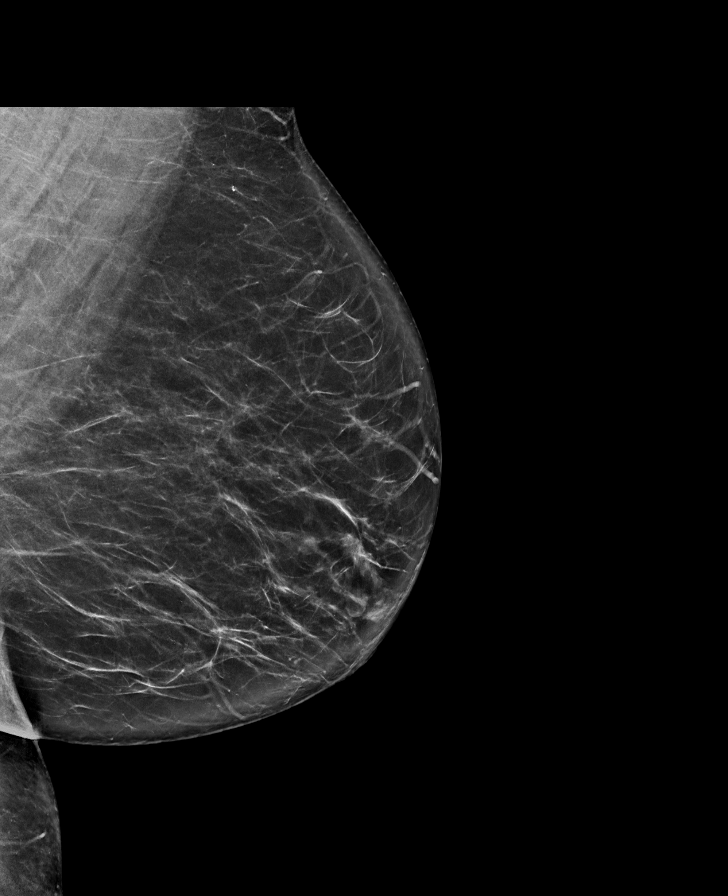

[L CC synth-2D]
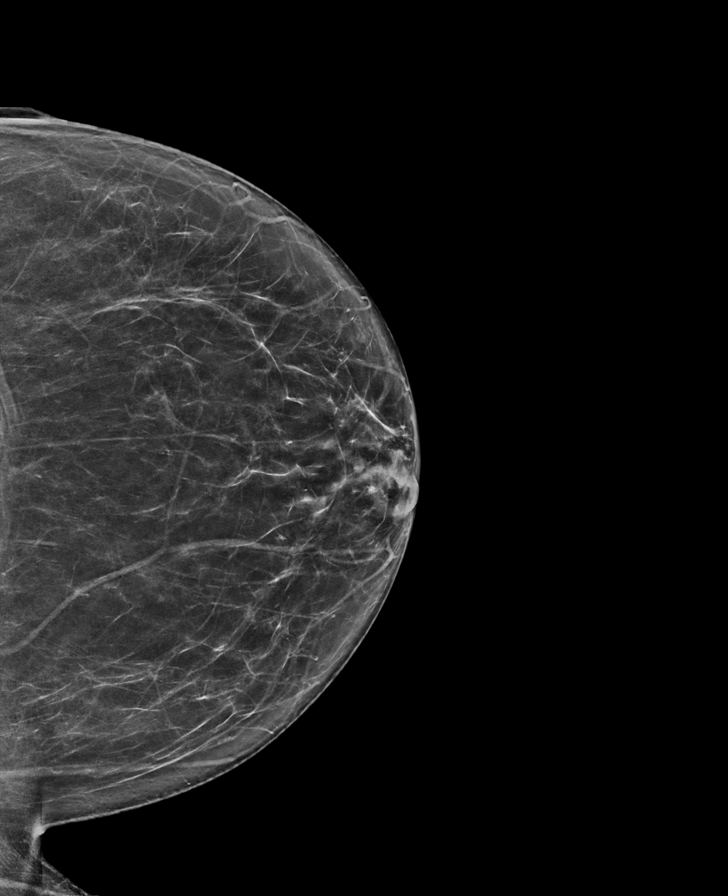

[R CC synth-2D]
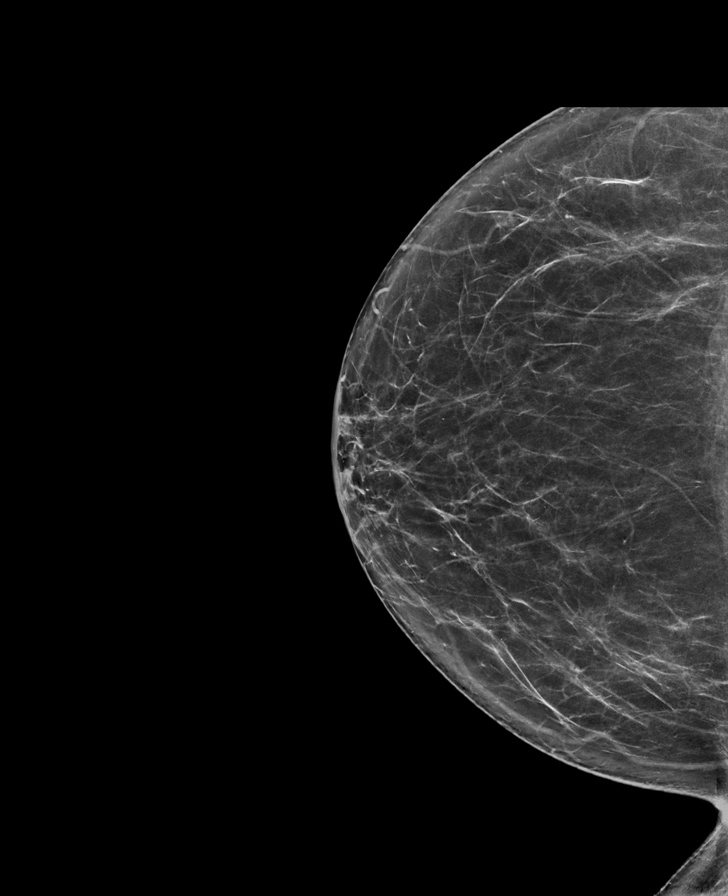

[R MLO synth-2D]
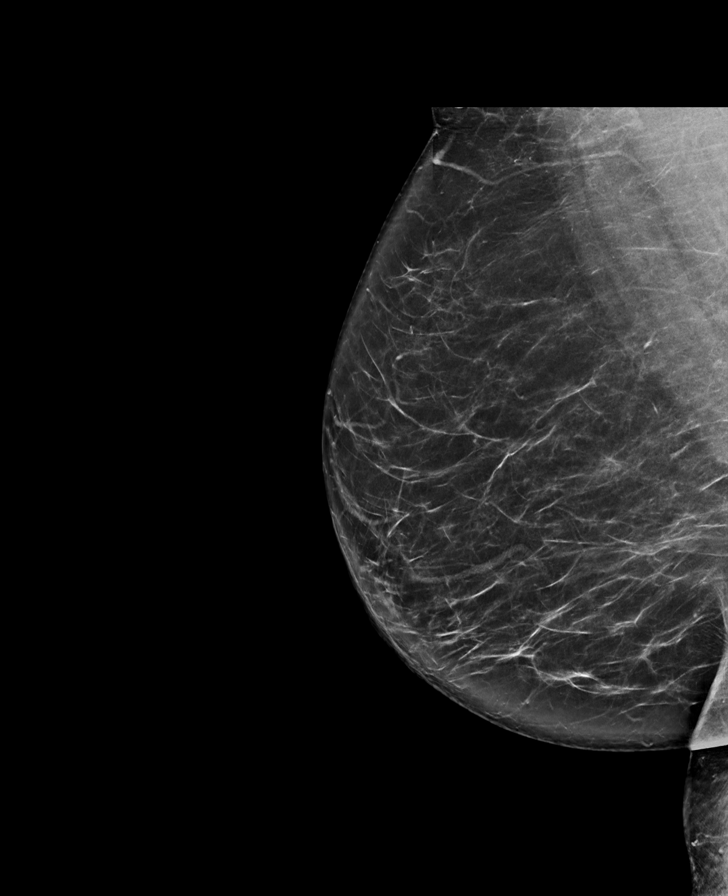

[L CC tomo · tomo slice 37/72.0]
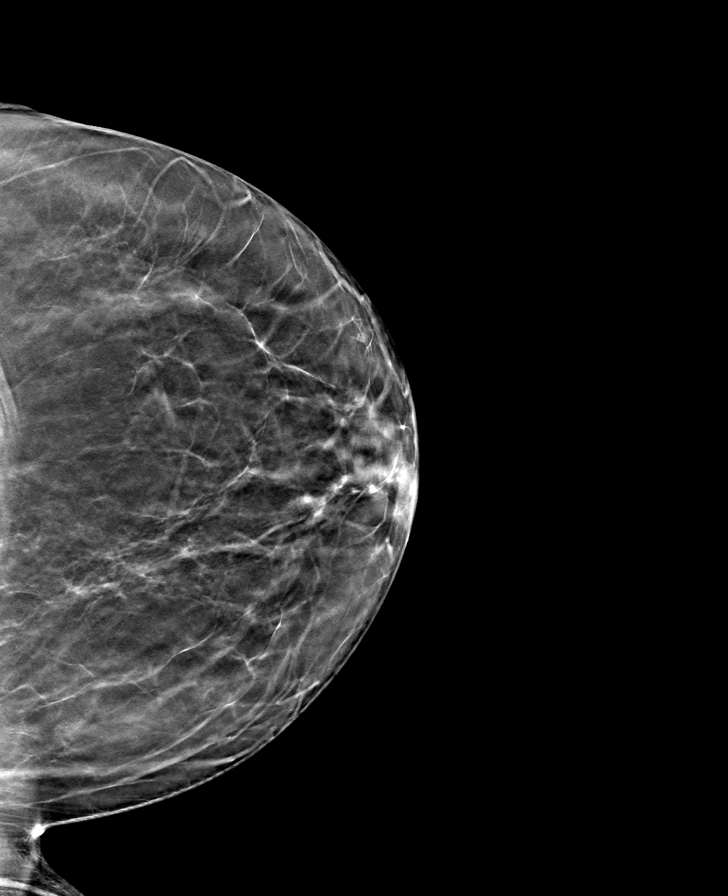

[L MLO tomo · tomo slice 41/80.0]
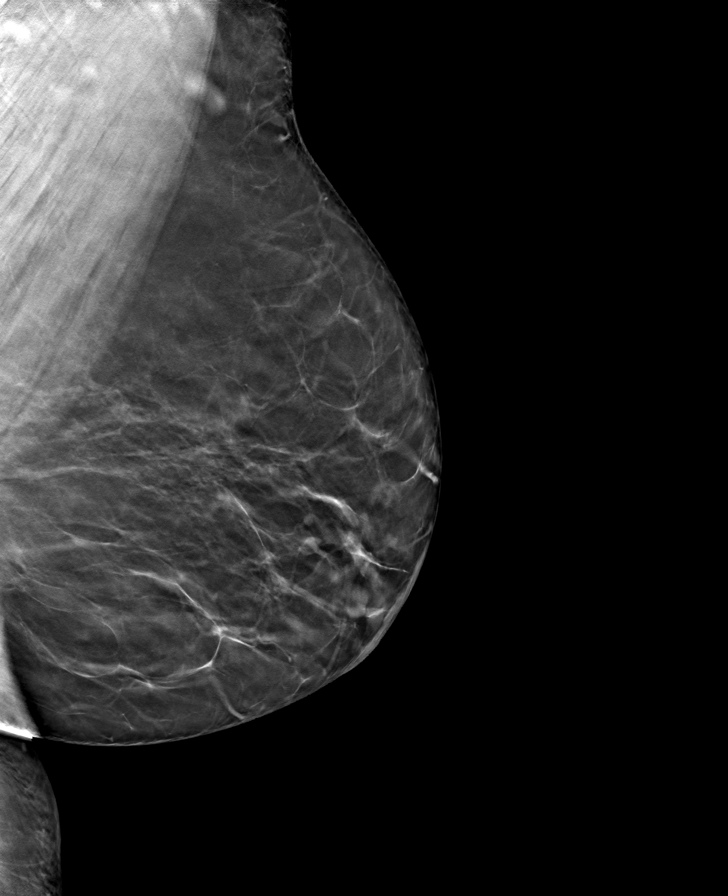

[R CC tomo · tomo slice 39/77.0]
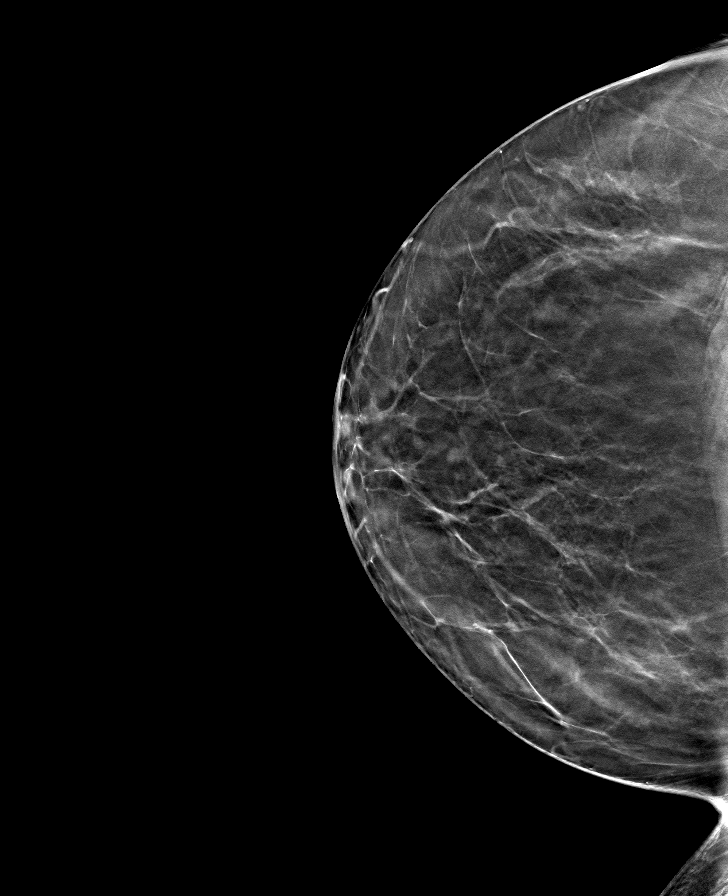

[R MLO tomo · tomo slice 42/83.0]
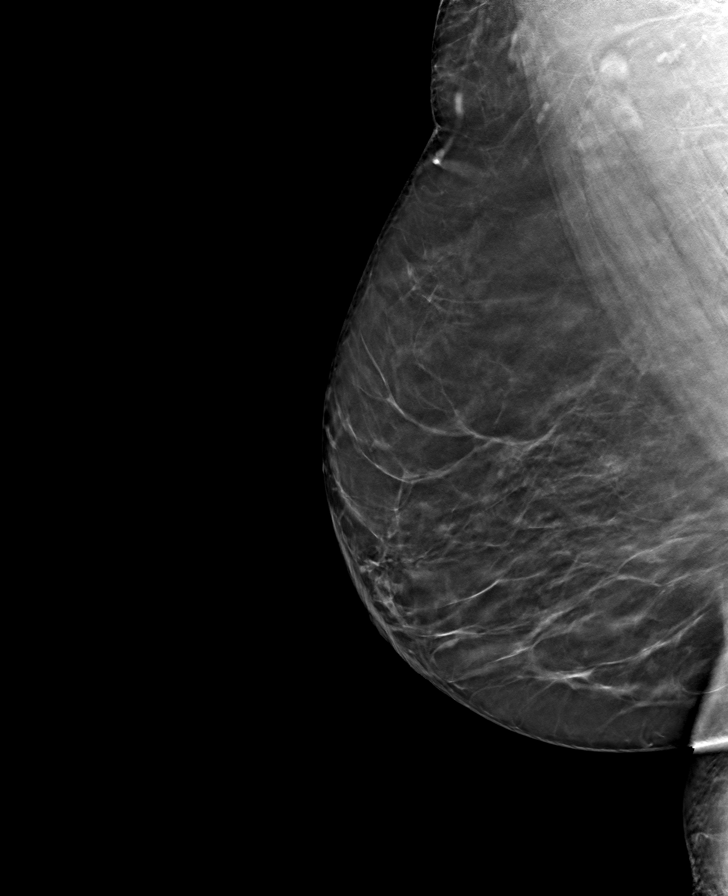

[8 of 24 positions shown; findings below may reference images not displayed]

ACR Breast Density Category b: There are scattered areas of
fibroglandular density.
FINDINGS: There are no findings suspicious for malignancy.
IMPRESSION: No mammographic evidence of malignancy. A result letter of this
screening mammogram will be mailed directly to the patient.

RECOMMENDATION:
Screening mammogram in one year. (Code:51-O-LD2)

BI-RADS CATEGORY  1: Negative.

## 2022-05-22 ENCOUNTER — Other Ambulatory Visit: Payer: Self-pay

## 2022-05-22 ENCOUNTER — Emergency Department (HOSPITAL_BASED_OUTPATIENT_CLINIC_OR_DEPARTMENT_OTHER): Payer: Medicare PPO

## 2022-05-22 ENCOUNTER — Encounter (HOSPITAL_BASED_OUTPATIENT_CLINIC_OR_DEPARTMENT_OTHER): Payer: Self-pay

## 2022-05-22 ENCOUNTER — Emergency Department (HOSPITAL_BASED_OUTPATIENT_CLINIC_OR_DEPARTMENT_OTHER): Payer: Medicare PPO | Admitting: Radiology

## 2022-05-22 ENCOUNTER — Emergency Department (HOSPITAL_BASED_OUTPATIENT_CLINIC_OR_DEPARTMENT_OTHER)
Admission: EM | Admit: 2022-05-22 | Discharge: 2022-05-22 | Disposition: A | Discharge status: Home / Self Care | Payer: Medicare PPO | Attending: Emergency Medicine | Admitting: Emergency Medicine

## 2022-05-22 DIAGNOSIS — Y93K1 Activity, walking an animal: Secondary | ICD-10-CM | POA: Insufficient documentation

## 2022-05-22 DIAGNOSIS — S39012A Strain of muscle, fascia and tendon of lower back, initial encounter: Secondary | ICD-10-CM | POA: Insufficient documentation

## 2022-05-22 DIAGNOSIS — W1830XA Fall on same level, unspecified, initial encounter: Secondary | ICD-10-CM | POA: Diagnosis not present

## 2022-05-22 DIAGNOSIS — M62830 Muscle spasm of back: Secondary | ICD-10-CM | POA: Insufficient documentation

## 2022-05-22 DIAGNOSIS — M4316 Spondylolisthesis, lumbar region: Secondary | ICD-10-CM | POA: Diagnosis not present

## 2022-05-22 DIAGNOSIS — W19XXXA Unspecified fall, initial encounter: Secondary | ICD-10-CM

## 2022-05-22 DIAGNOSIS — S46912A Strain of unspecified muscle, fascia and tendon at shoulder and upper arm level, left arm, initial encounter: Secondary | ICD-10-CM | POA: Diagnosis not present

## 2022-05-22 DIAGNOSIS — M25512 Pain in left shoulder: Secondary | ICD-10-CM | POA: Diagnosis not present

## 2022-05-22 DIAGNOSIS — M62838 Other muscle spasm: Secondary | ICD-10-CM

## 2022-05-22 DIAGNOSIS — M48061 Spinal stenosis, lumbar region without neurogenic claudication: Secondary | ICD-10-CM | POA: Diagnosis not present

## 2022-05-22 MED ORDER — OXYCODONE-ACETAMINOPHEN 5-325 MG PO TABS
1.0000 | ORAL_TABLET | Freq: Once | ORAL | Status: AC
  Administered 2022-05-22: 1 via ORAL
  Filled 2022-05-22: qty 1

## 2022-05-22 MED ORDER — DIAZEPAM 5 MG PO TABS: 5.0000 mg | ORAL_TABLET | Freq: Three times a day (TID) | ORAL | 0 refills | Status: DC | PRN

## 2022-05-22 MED ORDER — HYDROCODONE-ACETAMINOPHEN 5-325 MG PO TABS: 1.0000 | ORAL_TABLET | ORAL | 0 refills | Status: DC | PRN

## 2022-05-22 MED ORDER — DIAZEPAM 5 MG PO TABS
5.0000 mg | ORAL_TABLET | Freq: Once | ORAL | Status: AC
  Administered 2022-05-22: 5 mg via ORAL
  Filled 2022-05-22: qty 1

## 2022-05-22 MED ORDER — ONDANSETRON 4 MG PO TBDP
4.0000 mg | ORAL_TABLET | Freq: Once | ORAL | Status: AC
  Administered 2022-05-22: 4 mg via ORAL
  Filled 2022-05-22: qty 1

## 2022-05-22 NOTE — ED Provider Notes (Signed)
[facility] EMERGENCY DEPT Provider Note   CSN: [id number] Arrival date & time: 05/22/22  1526     History  Chief Complaint  Patient presents with   Dawn Carlson    Dawn Carlson is a 71 y.o. female.  71 year old female presents with complaint of left shoulder pain and back pain after fall today.  Patient was taking her dog for a walk when the dog pulled on the leash causing her to fall.  Patient states that she fell to the ground landing on her abdomen with her arms out in front of her.  The dog's leash was attached to her left arm, reports pain in the left shoulder.  Reports low back pain which radiates around her abdomen.  Denies any associated nausea or vomiting.  Patient states that after the accident she was able to stand up and ambulated just fine back to her house and thought that she was fine however later developed significant waves of pain from her back.  Did not hit head, is not anticoagulated.  No other injuries, complaints, concerns.       Home Medications Prior to Admission medications   Medication Sig Start Date End Date Taking? Authorizing Provider  diazepam (VALIUM) 5 MG tablet Take 1 tablet (5 mg total) by mouth every 8 (eight) hours as needed for muscle spasms. 05/22/22  Yes Jeannie Fend, PA-C  HYDROcodone-acetaminophen (NORCO/VICODIN) 5-325 MG tablet Take 1 tablet by mouth every 4 (four) hours as needed. 05/22/22  Yes Jeannie Fend, PA-C  hydrOXYzine (VISTARIL) 25 MG capsule Take 1 capsule (25 mg total) by mouth every 8 (eight) hours as needed. For itching. Can make you sleepy. 03/28/22   Tollie Eth, NP  ibuprofen (ADVIL,MOTRIN) 800 MG tablet  08/04/17   [provider]  levothyroxine (SYNTHROID) 50 MCG tablet Take 1 tablet (50 mcg total) by mouth daily. 05/08/21   Tollie Eth, NP  Semaglutide, 1 MG/DOSE, 4 MG/3ML SOPN Inject 1 mg as directed once a week. Patient not taking: Reported on 04/16/2022 11/14/21   [place], Buren Kos, MD   triamcinolone cream (KENALOG) 0.1 % Apply 1 Application topically 2 (two) times daily. For itching. Apply thin layer. Do not use for more than 7 days in a row. OK to mix with body lotion. 03/28/22   Tollie Eth, NP      Allergies    Patient has no known allergies.    Review of Systems   Review of Systems Negative except as per HPI Physical Exam Updated Vital Signs BP (!) 153/84   Pulse 64   Temp 97.9 F (36.6 C) (Temporal)   Resp 15   Ht 5\' 6"  (1.676 m)   Wt 106.8 kg   SpO2 99%   BMI 38.00 kg/m  Physical Exam Vitals and nursing note reviewed.  Constitutional:      General: She is not in acute distress.    Appearance: She is well-developed. She is not diaphoretic.  HENT:     Head: Normocephalic and atraumatic.  Cardiovascular:     Pulses: Normal pulses.  Pulmonary:     Effort: Pulmonary effort is normal.  Abdominal:     Palpations: Abdomen is soft.     Tenderness: There is no abdominal tenderness.  Musculoskeletal:        General: No swelling, tenderness or deformity.     Right shoulder: Normal. No swelling.     Left shoulder: Normal. No swelling, tenderness, bony tenderness or crepitus. Normal range of  motion. Normal strength. Normal pulse.     Right wrist: Normal.     Left wrist: Normal.     Cervical back: No tenderness or bony tenderness.     Thoracic back: No tenderness or bony tenderness.     Lumbar back: Spasms present. No tenderness or bony tenderness. Decreased range of motion.     Right hip: Normal.     Left hip: Normal.     Right knee: Normal.     Left knee: Normal.  Skin:    General: Skin is warm and dry.     Findings: No erythema or rash.  Neurological:     Mental Status: She is alert and oriented to person, place, and time.     Sensory: No sensory deficit.     Motor: No weakness.  Psychiatric:        Behavior: Behavior normal.     ED Results / Procedures / Treatments   Labs (all labs ordered are listed, but only abnormal results are  displayed) Labs Reviewed - No data to display  EKG None  Radiology CT Lumbar Spine Wo Contrast  Result Date: 05/22/2022 CLINICAL DATA:  Initial evaluation for acute back pain status post fall. EXAM: CT LUMBAR SPINE WITHOUT CONTRAST TECHNIQUE: Multidetector CT imaging of the lumbar spine was performed without intravenous contrast administration. Multiplanar CT image reconstructions were also generated. RADIATION DOSE REDUCTION: This exam was performed according to the departmental dose-optimization program which includes automated exposure control, adjustment of the mA and/or kV according to patient size and/or use of iterative reconstruction technique. COMPARISON:  None available. FINDINGS: Segmentation: Standard. Lowest well-formed disc space labeled the L5-S1 level. Alignment: Straightening of the normal lumbar lordosis with trace levoscoliosis. Trace degenerative anterolisthesis of L4 on L5, with trace retrolisthesis of L1 on L2. Vertebrae: Vertebral body height maintained without acute or chronic fracture. Visualized sacrum and pelvis intact. No discrete or worrisome osseous lesions. Paraspinal and other soft tissues: Paraspinous soft tissues demonstrate no acute finding. Mild-to-moderate aorto bi-iliac atherosclerotic disease. Disc levels: Mild for age degenerative disc disease seen within the lumbar spine, most pronounced at L3-4 and L4-5. Mild-to-moderate multilevel facet hypertrophy, also most pronounced at L3-4 and L4-5. Resultant moderate spinal stenosis at the L4-5 level. No other high-grade spinal stenosis by CT. IMPRESSION: 1. No acute osseous injury within the lumbar spine. 2. Multilevel degenerative spondylosis and facet arthrosis with resultant moderate spinal stenosis at L4-5. Aortic Atherosclerosis (ICD10-I70.0). Electronically Signed   By: Jeannine Boga M.D.   On: 05/22/2022 20:49   DG Shoulder Left  Result Date: 05/22/2022 CLINICAL DATA:  Left shoulder pain fall this morning  EXAM: LEFT SHOULDER - 2+ VIEW COMPARISON:  None Available. FINDINGS: There is no evidence of fracture or dislocation. 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 Soft tissues are unremarkable. IMPRESSION: 1. No fracture or dislocation. 2. 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 Electronically Signed   By: Keane Police D.O.   On: 05/22/2022 17:19    Procedures Procedures    Medications Ordered in ED Medications  diazepam (VALIUM) tablet 5 mg (5 mg Oral Given 05/22/22 2037)  oxyCODONE-acetaminophen (PERCOCET/ROXICET) 5-325 MG per tablet 1 tablet (1 tablet Oral Given 05/22/22 2037)  ondansetron (ZOFRAN-ODT) disintegrating tablet 4 mg (4 mg Oral Given 05/22/22 2036)    ED Course/ Medical Decision Making/ A&P                           Medical Decision Making  Amount and/or Complexity of Data Reviewed Radiology: ordered.  Risk Prescription drug management.   71 year old female with back and shoulder pain with a fall today.  Found to have [place] low back muscle spasms on exam, pain worse with movement, no specific point tenderness.  Abdomen is soft and nontender.  Symmetric lower extremity reflexes strength.  Left shoulder with normal range of motion, no pain with palpation. X-ray left shoulder is negative for acute bony abnormality, agree with radiologist interpretation.  CT lumbar spine obtained to further evaluate for concern for compression fracture or other acute injury.  Shows arthritic changes without acute bony injury. Patient is provided with Valium, Percocet, Zofran for her pain with improvement. Will discharge with Norco, Valium.  Recommend Lidoderm, Voltaren gel, warm compresses and gentle stretching.  Encouraged to follow-up with primary care provider tomorrow, may benefit from physical therapy.  Provided with return to ER precautions.        Final Clinical Impression(s) / ED Diagnoses Final diagnoses:  Fall, initial encounter   Strain of lumbar region, initial encounter  Strain of left shoulder, initial encounter  Muscle spasm    Rx / DC Orders ED Discharge Orders          Ordered    diazepam (VALIUM) 5 MG tablet  Every 8 hours PRN        05/22/22 2102    HYDROcodone-acetaminophen (NORCO/VICODIN) 5-325 MG tablet  Every 4 hours PRN        05/22/22 2102              Tacy Learn, PA-C 05/22/22 2108    Regan Lemming, MD 05/22/22 2318

## 2022-05-22 NOTE — ED Triage Notes (Signed)
Patient here POV from Home.  Endorses Fall today this AM when she was walking her dog and it pulled her onto the ground. Pain to lower ABD Regions and Left Shoulder.  No Head Injury. No LOC. No Anticoagulants.    NAD noted during Triage. A&Ox4. GCS 15. BIB Wheelchair.

## 2022-05-22 NOTE — Discharge Instructions (Addendum)
Do not drive or operate machinery while taking Valium and Norco.  These medications can cause constipation, recommend using Colace and other medications as needed to keep your bowels soft and moving.  These medications can put you at risk for falling, ambulate with assistance.  Valium and Norco as needed as prescribed for muscle spasms and pain.  Can substitute Norco with 650 mg Tylenol if this is strong enough to control your pain.  Can also use topical Voltaren gel.  Can also use topical lidocaine patches.  Warm compresses for 20 minutes at a time to sore muscles, followed with gentle stretching.  Recheck with your primary care provider.  Call tomorrow to schedule an appointment.  Return to the ER for severe or concerning symptoms.

## 2022-05-22 NOTE — ED Notes (Signed)
RN provided AVS using Teachback Method. Patient verbalizes understanding of Discharge Instructions. Opportunity for Questioning and Answers were provided by RN. Patient Discharged from ED in Wheelchair to Home with Family.  

## 2022-05-29 ENCOUNTER — Ambulatory Visit: Payer: 59

## 2022-06-04 ENCOUNTER — Telehealth: Payer: Self-pay

## 2022-06-04 NOTE — Telephone Encounter (Signed)
        Patient  visited [facility] on  12/6   Telephone encounter attempt :  1st  A HIPAA compliant voice message was left requesting a return call.  Instructed patient to call back    Fahed Morten [facility] Health Care Guide, [place], Care Management  [phone] [address], [place], [place] [address] Phone: [phone] Email: [email or web address]       

## 2022-06-05 ENCOUNTER — Telehealth: Payer: Self-pay

## 2022-06-05 NOTE — Telephone Encounter (Signed)
     Patient  visit on 12/6  at [facility]   Have you been able to follow up with your primary care physician? No   The patient was or was not able to obtain any needed medicine or equipment. Yes   Are there diet recommendations that you are having difficulty following? NA  Patient expresses understanding of discharge instructions and education provided has no other needs at this time. Yes    Lenard Forth [facility] Guide, [facility], Care Management  [phone] [address] [place], [place], [place] [address] Phone: [phone] Email: [email or web address]

## 2022-07-08 ENCOUNTER — Telehealth (HOSPITAL_BASED_OUTPATIENT_CLINIC_OR_DEPARTMENT_OTHER): Payer: Self-pay | Admitting: Family Medicine

## 2022-07-08 NOTE — Telephone Encounter (Signed)
Left message for patient to call back and schedule Medicare Annual Wellness Visit (AWV) in office.   If not able to come in office, please offer to do virtually or by telephone.  Left office number and my jabber #[phone].  Last AWV:04/19/2021  Please schedule at anytime with Nurse Health Advisor.   

## 2022-07-10 ENCOUNTER — Ambulatory Visit (HOSPITAL_BASED_OUTPATIENT_CLINIC_OR_DEPARTMENT_OTHER): Payer: 59

## 2022-07-17 ENCOUNTER — Ambulatory Visit (HOSPITAL_BASED_OUTPATIENT_CLINIC_OR_DEPARTMENT_OTHER): Payer: 59 | Admitting: Family Medicine

## 2022-08-06 ENCOUNTER — Telehealth (HOSPITAL_BASED_OUTPATIENT_CLINIC_OR_DEPARTMENT_OTHER): Payer: Self-pay | Admitting: Family Medicine

## 2022-08-06 NOTE — Telephone Encounter (Signed)
Contacted Dawn Carlson to schedule their annual wellness visit. Appointment made for 04/19/2021.  Thank you,  [place] Direct dial  [phone]

## 2022-08-08 ENCOUNTER — Ambulatory Visit: Payer: Medicare PPO

## 2022-08-12 ENCOUNTER — Ambulatory Visit (HOSPITAL_BASED_OUTPATIENT_CLINIC_OR_DEPARTMENT_OTHER): Payer: Medicare PPO

## 2022-08-12 DIAGNOSIS — E039 Hypothyroidism, unspecified: Secondary | ICD-10-CM

## 2022-08-13 ENCOUNTER — Encounter (HOSPITAL_BASED_OUTPATIENT_CLINIC_OR_DEPARTMENT_OTHER): Payer: Medicare PPO

## 2022-08-13 LAB — TSH+FREE T4
Free T4: 0.79 ng/dL — ABNORMAL LOW (ref 0.82–1.77)
TSH: 10.5 u[IU]/mL — ABNORMAL HIGH (ref 0.450–4.500)

## 2022-08-13 LAB — T3: T3, Total: 118 ng/dL (ref 71–180)

## 2022-08-19 ENCOUNTER — Ambulatory Visit (INDEPENDENT_AMBULATORY_CARE_PROVIDER_SITE_OTHER): Payer: Medicare PPO | Admitting: Family Medicine

## 2022-08-19 ENCOUNTER — Encounter (HOSPITAL_BASED_OUTPATIENT_CLINIC_OR_DEPARTMENT_OTHER): Payer: Self-pay | Admitting: Family Medicine

## 2022-08-19 VITALS — BP 133/77 | HR 75 | Ht 66.0 in | Wt 239.2 lb

## 2022-08-19 DIAGNOSIS — E039 Hypothyroidism, unspecified: Secondary | ICD-10-CM

## 2022-08-19 DIAGNOSIS — R7303 Prediabetes: Secondary | ICD-10-CM | POA: Diagnosis not present

## 2022-08-19 MED ORDER — LEVOTHYROXINE SODIUM 75 MCG PO TABS: 75.0000 ug | ORAL_TABLET | Freq: Every day | ORAL | 1 refills | Status: DC

## 2022-08-19 NOTE — Assessment & Plan Note (Signed)
Recheck of thyroid function study shows further increase in TSH and now with free T4 below lower limit of normal.  Patient denies any new symptoms, clinically appears euthyroid.  She does continue with levothyroxine 50 mcg once daily, however does admit that about 6 to 8 weeks ago, she did not take medication for a couple weeks due to life circumstances.  Since that time, she has been taking medication regularly. We discussed options today, given further increase in TSH, would be reasonable to increase dose of levothyroxine, patient amenable to this.  New prescription for levothyroxine 75 mcg once daily sent to pharmacy on file.  We will plan to follow-up in about 8 weeks or so to recheck thyroid labs and assess progress with the new dose of medication.  If any issues arise before then, recommend returning to the office for evaluation

## 2022-08-19 NOTE — Progress Notes (Signed)
    Procedures performed today:    None.  Independent interpretation of notes and tests performed by another provider:   None.  Brief History, Exam, Impression, and Recommendations:    BP 133/77 (BP Location: Left Arm, Patient Position: Sitting, Cuff Size: Large)   Pulse 75   Ht  (1.676 m)   Wt 239 lb 3.2 oz (108.5 kg)   SpO2 97%   BMI 38.61 kg/m   Hypothyroidism Recheck of thyroid function study shows further increase in TSH and now with free T4 below lower limit of normal.  Patient denies any new symptoms, clinically appears euthyroid.  She does continue with levothyroxine 50 mcg once daily, however does admit that about 6 to 8 weeks ago, she did not take medication for a couple weeks due to life circumstances.  Since that time, she has been taking medication regularly. We discussed options today, given further increase in TSH, would be reasonable to increase dose of levothyroxine, patient amenable to this.  New prescription for levothyroxine 75 mcg once daily sent to pharmacy on file.  We will plan to follow-up in about 8 weeks or so to recheck thyroid labs and assess progress with the new dose of medication.  If any issues arise before then, recommend returning to the office for evaluation  Pre-diabetes Patient continues with use of semaglutide, tolerating medication well.  Hemoglobin A1c has generally remained stable.  Most recent hemoglobin A1c 3 months ago.  No new symptoms of polyuria or polydipsia.  We will plan to recheck hemoglobin A1c around the time of next office visit in about 2 to 3 months.  Return in about 3 months (around 11/19/2022) for thyroid, preDM.   ___________________________________________ Sandia Pfund [place], MD, ABFM, [facility] Primary Care and Sports Medicine [facility]

## 2022-08-28 ENCOUNTER — Encounter (HOSPITAL_BASED_OUTPATIENT_CLINIC_OR_DEPARTMENT_OTHER): Payer: Self-pay

## 2022-09-09 ENCOUNTER — Telehealth (HOSPITAL_BASED_OUTPATIENT_CLINIC_OR_DEPARTMENT_OTHER): Payer: Self-pay | Admitting: Family Medicine

## 2022-09-09 NOTE — Telephone Encounter (Signed)
Called patient to schedule Medicare Annual Wellness Visit (AWV). Left message for patient to call back and schedule Medicare Annual Wellness Visit (AWV).  Last date of AWV: 04/19/2021   Please schedule an AWVS appointment at any time with 04/19/2021 .  If any questions, please contact me at [phone].    Thank you,  [place] Direct dial  [phone]

## 2022-10-06 NOTE — Assessment & Plan Note (Signed)
Patient continues with use of semaglutide, tolerating medication well.  Hemoglobin A1c has generally remained stable.  Most recent hemoglobin A1c 3 months ago.  No new symptoms of polyuria or polydipsia.  We will plan to recheck hemoglobin A1c around the time of next office visit in about 2 to 3 months.

## 2022-10-10 ENCOUNTER — Telehealth (HOSPITAL_BASED_OUTPATIENT_CLINIC_OR_DEPARTMENT_OTHER): Payer: Self-pay | Admitting: Family Medicine

## 2022-10-10 NOTE — Telephone Encounter (Signed)
Called patient to schedule Medicare Annual Wellness Visit (AWV). Left message for patient to call back and schedule Medicare Annual Wellness Visit (AWV).  Last date of AWV: 04/19/2021   Please schedule an appointment at any time with Abby, NHA. .  If any questions, please contact me at [phone].  Thank you,  Judeth Cornfield,  AMB Clinical Support [facility] AWV Program Direct Dial ??[phone]

## 2022-10-28 ENCOUNTER — Telehealth (HOSPITAL_BASED_OUTPATIENT_CLINIC_OR_DEPARTMENT_OTHER): Payer: Self-pay | Admitting: Family Medicine

## 2022-10-28 NOTE — Telephone Encounter (Signed)
Contacted Lonzo Cloud to schedule their annual wellness visit. Appointment made for 11/05/2022.  Thank you,  Judeth Cornfield,  AMB Clinical Support [facility] AWV Program Direct Dial ??[phone]

## 2022-10-28 NOTE — Telephone Encounter (Signed)
Called patient to schedule Medicare Annual Wellness Visit (AWV). Left message for patient to call back and schedule Medicare Annual Wellness Visit (AWV).  Last date of AWV: 04/19/2021   Please schedule an appointment at any time with Abby, NHA. .  If any questions, please contact me at [phone].  Thank you,  Stephanie,  AMB Clinical Support [facility] AWV Program Direct Dial ??[phone]   

## 2022-10-29 ENCOUNTER — Ambulatory Visit
Admission: RE | Admit: 2022-10-29 | Discharge: 2022-10-29 | Disposition: A | Discharge status: Home / Self Care | Payer: Medicare PPO | Source: Ambulatory Visit | Attending: Family Medicine | Admitting: Family Medicine

## 2022-10-29 DIAGNOSIS — Z1231 Encounter for screening mammogram for malignant neoplasm of breast: Secondary | ICD-10-CM

## 2022-11-05 ENCOUNTER — Encounter (HOSPITAL_BASED_OUTPATIENT_CLINIC_OR_DEPARTMENT_OTHER): Payer: Self-pay

## 2022-11-05 ENCOUNTER — Ambulatory Visit (INDEPENDENT_AMBULATORY_CARE_PROVIDER_SITE_OTHER): Payer: Medicare PPO

## 2022-11-05 VITALS — BP 133/77 | Ht 66.0 in | Wt 239.0 lb

## 2022-11-05 DIAGNOSIS — Z0000 Encounter for general adult medical examination without abnormal findings: Secondary | ICD-10-CM | POA: Diagnosis not present

## 2022-11-05 DIAGNOSIS — Z780 Asymptomatic menopausal state: Secondary | ICD-10-CM

## 2022-11-05 NOTE — Progress Notes (Signed)
I connected with  Lonzo Cloud on 11/05/22 by a audio enabled telemedicine application and verified that I am speaking with the correct person using two identifiers.  Patient Location: Home  Provider Location: Home Office  I discussed the limitations of evaluation and management by telemedicine. The patient expressed understanding and agreed to proceed. Subjective:   Dawn Carlson is a 72 y.o. female who presents for Medicare Annual (Subsequent) preventive examination.  Review of Systems      Cardiac Risk Factors include: advanced age (>40men, >67 women);obesity (BMI >30kg/m2);sedentary lifestyle     Objective:    Today's Vitals   11/05/22 1313  BP: 133/77  Weight: 239 lb (108.4 kg)  Height: 5\' 6"  (1.676 m)  PainSc: 0-No pain   Body mass index is 38.58 kg/m.     11/05/2022    1:21 PM 05/22/2022    4:15 PM 04/19/2021   10:59 AM 03/13/2017    7:09 PM  Advanced Directives  Does Patient Have a Medical Advance Directive? Yes No Yes No  Type of Estate agent of [place];Living will  Living will;Healthcare Power of Attorney   Does patient want to make changes to medical advance directive?   Yes (MAU/Ambulatory/Procedural Areas - Information given)   Copy of Healthcare Power of Attorney in Chart? No - copy requested  No - copy requested   Would patient like information on creating a medical advance directive?  No - Patient declined      Current Medications (verified) Outpatient Encounter Medications as of 11/05/2022  Medication Sig   levothyroxine (SYNTHROID) 75 MCG tablet Take 1 tablet (75 mcg total) by mouth daily.   diazepam (VALIUM) 5 MG tablet Take 1 tablet (5 mg total) by mouth every 8 (eight) hours as needed for muscle spasms. (Patient not taking: Reported on 08/19/2022)   HYDROcodone-acetaminophen (NORCO/VICODIN) 5-325 MG tablet Take 1 tablet by mouth every 4 (four) hours as needed. (Patient not taking: Reported on 08/19/2022)   hydrOXYzine  (VISTARIL) 25 MG capsule Take 1 capsule (25 mg total) by mouth every 8 (eight) hours as needed. For itching. Can make you sleepy. (Patient not taking: Reported on 08/19/2022)   ibuprofen (ADVIL,MOTRIN) 800 MG tablet  (Patient not taking: Reported on 08/19/2022)   Semaglutide, 1 MG/DOSE, 4 MG/3ML SOPN Inject 1 mg as directed once a week. (Patient not taking: Reported on 04/16/2022)   No facility-administered encounter medications on file as of 11/05/2022.    Allergies (verified) Patient has no known allergies.   History: Past Medical History:  Diagnosis Date   Rash 03/12/2021   Thyroid disease    Past Surgical History:  Procedure Laterality Date   CARPAL TUNNEL RELEASE     Family History  Problem Relation Age of Onset   Hypertension Mother    Heart disease Mother    Diabetes Mother    Diabetes Sister    Diabetes Brother    Diabetes Other    Schizophrenia Grandson    Breast cancer Neg Hx    Social History   Socioeconomic History   Marital status: Divorced    Spouse name: Not on file   Number of children: Not on file   Years of education: Not on file   Highest education level: Not on file  Occupational History   Occupation: retired    Comment: Forensic scientist  Tobacco Use   Smoking status: Never   Smokeless tobacco: Never  Vaping Use   Vaping Use: Never used  Substance and Sexual  Activity   Alcohol use: No   Drug use: No   Sexual activity: Not Currently    Birth control/protection: Condom  Other Topics Concern   Not on file  Social History Narrative   Lives in her own home with adult grandson.    Divorced for over 30 years   Jehovah's Witness.    Social Determinants of Health   Financial Resource Strain: Low Risk  (11/05/2022)   Overall Financial Resource Strain (CARDIA)    Difficulty of Paying Living Expenses: Not hard at all  Food Insecurity: No Food Insecurity (11/05/2022)   Hunger Vital Sign    Worried About Running Out of Food in the Last Year: Never true     Ran Out of Food in the Last Year: Never true  Transportation Needs: No Transportation Needs (11/05/2022)   PRAPARE - Administrator, Civil Service (Medical): No    Lack of Transportation (Non-Medical): No  Physical Activity: Sufficiently Active (11/05/2022)   Exercise Vital Sign    Days of Exercise per Week: 7 days    Minutes of Exercise per Session: 30 min  Stress: No Stress Concern Present (11/05/2022)   [employer] of Occupational Health - Occupational Stress Questionnaire    Feeling of Stress : Not at all  Social Connections: Socially Integrated (11/05/2022)   Social Connection and Isolation Panel [NHANES]    Frequency of Communication with Friends and Family: More than three times a week    Frequency of Social Gatherings with Friends and Family: More than three times a week    Attends Religious Services: More than 4 times per year    Active Member of [employer] or Organizations: Yes    Attends Engineer, structural: More than 4 times per year    Marital Status: Married    Tobacco Counseling Counseling given: Yes   Clinical Intake:  Pre-visit preparation completed: Yes  Pain : No/denies pain Pain Score: 0-No pain     BMI - recorded: 38.58 Nutritional Status: BMI > 30  Obese Nutritional Risks: None Diabetes: No  How often do you need to have someone help you when you read instructions, pamphlets, or other written materials from your doctor or pharmacy?: 1 - Never  Diabetic?No  Interpreter Needed?: No  Information entered by :: Abby Suleika Donavan, CMA   Activities of Daily Living    11/05/2022    1:22 PM 08/19/2022    9:38 AM  In your present state of health, do you have any difficulty performing the following activities:  Hearing? 0 0  Vision? 0 0  Difficulty concentrating or making decisions? 0 0  Walking or climbing stairs? 0 0  Dressing or bathing? 0 0  Doing errands, shopping? 0 0  Preparing Food and eating ? N   Using the Toilet? N   In  the past six months, have you accidently leaked urine? N   Do you have problems with loss of bowel control? N   Managing your Medications? N   Managing your Finances? N   Housekeeping or managing your Housekeeping? N     Patient Care Team: [place], Buren Kos, MD as PCP - General (Family Medicine)  Indicate any recent Medical Services you may have received from other than [facility] providers in the past year (date may be approximate).     Assessment:   This is a routine wellness examination for [place].  Hearing/Vision screen Hearing Screening - Comments:: Patient denies any hearing difficulties.   Vision  Screening - Comments:: Patient wears eye glasses and is UTD with yearly eye exam   Dietary issues and exercise activities discussed: Current Exercise Habits: Home exercise routine, Type of exercise: walking;stretching, Time (Minutes): 30, Frequency (Times/Week): 7, Weekly Exercise (Minutes/Week): 210, Intensity: Mild, Exercise limited by: None identified   Goals Addressed             This Visit's Progress    Patient Stated       Patient states her goal is to lose weight       Depression Screen    11/05/2022    1:18 PM 08/19/2022    9:37 AM 04/16/2022    1:40 PM 11/14/2021    8:56 AM 10/03/2021    9:27 AM 10/03/2021    9:26 AM 04/19/2021    3:14 PM  PHQ 2/9 Scores  PHQ - 2 Score 0 0 0 0 0 0 0  PHQ- 9 Score  0 0    0  Exception Documentation  Medical reason Medical reason Medical reason       Fall Risk    11/05/2022    1:21 PM 08/19/2022    9:37 AM 04/16/2022    1:40 PM 11/14/2021    8:55 AM 10/03/2021    9:25 AM  Fall Risk   Falls in the past year? 0 0 0 0 0  Number falls in past yr: 0 0 0 0 0  Injury with Fall? 0 0 0 0 0  Risk for fall due to : No Fall Risks No Fall Risks No Fall Risks No Fall Risks No Fall Risks  Follow up Falls prevention discussed Falls evaluation completed Falls evaluation completed Falls evaluation completed Falls evaluation completed    FALL  RISK PREVENTION PERTAINING TO THE HOME:  Any stairs in or around the home? No  If so, are there any without handrails? No  Home free of loose throw rugs in walkways, pet beds, electrical cords, etc? Yes  Adequate lighting in your home to reduce risk of falls? Yes   ASSISTIVE DEVICES UTILIZED TO PREVENT FALLS:  Life alert? No  Use of a cane, walker or w/c? No  Grab bars in the bathroom? No  Shower chair or bench in shower? No  Elevated toilet seat or a handicapped toilet? No   TIMED UP AND GO:  Was the test performed? No . Telephone visit Cognitive Function:        11/05/2022    1:23 PM 04/19/2021    3:15 PM  6CIT Screen  What Year? 0 points 0 points  What month? 0 points 0 points  What time? 0 points 0 points  Count back from 20 0 points 0 points  Months in reverse 0 points 0 points  Repeat phrase 0 points 0 points  Total Score 0 points 0 points    Immunizations Immunization History  Administered Date(s) Administered   Fluad Quad(high Dose 65+) 04/02/2022   Influenza,inj,Quad PF,6+ Mos 03/12/2021   PFIZER(Purple Top)SARS-COV-2 Vaccination 09/09/2019, 10/04/2019   Pfizer Covid-19 Vaccine Bivalent Booster 61yrs & up 06/12/2020, 01/15/2021   Pneumococcal Conjugate-13 08/25/2018   Pneumococcal Polysaccharide-23 04/19/2021   Td 01/26/2018    TDAP status: Up to date  Flu Vaccine status: Up to date  Pneumococcal vaccine status: Up to date  Covid-19 vaccine status: Information provided on how to obtain vaccines.   Qualifies for Shingles Vaccine? Yes   Zostavax completed No   Shingrix Completed?: No.    Education has been provided regarding the  importance of this vaccine. Patient has been advised to call insurance company to determine out of pocket expense if they have not yet received this vaccine. Advised may also receive vaccine at local pharmacy or Health Dept. Verbalized acceptance and understanding.  Screening Tests Health Maintenance  Topic Date Due   Zoster  Vaccines- Shingrix (1 of 2) 10/22/2023 (Originally 04/01/2003)   COVID-19 Vaccine (5 - 2023-24 season) 12/15/2023 (Originally 02/15/2022)   INFLUENZA VACCINE  01/16/2023   Medicare Annual Wellness (AWV)  11/05/2023   MAMMOGRAM  10/28/2024   DTaP/Tdap/Td (2 - Tdap) 01/27/2028   COLONOSCOPY (Pts 45-73yrs Insurance coverage will need to be confirmed)  09/13/2028   Pneumonia Vaccine 74+ Years old  Completed   DEXA SCAN  Completed   Hepatitis C Screening  Completed   HPV VACCINES  Aged Out    Health Maintenance  There are no preventive care reminders to display for this patient.   Colorectal cancer screening: Type of screening: Colonoscopy. Completed 08/2022. Repeat every 5 years  Mammogram status: Completed 10/29/2022. Repeat every year  Bone Density status: Ordered 11/05/22. Pt provided with contact info and advised to call to schedule appt.  Lung Cancer Screening: (Low Dose CT Chest recommended if Age 34-80 years, 30 pack-year currently smoking OR have quit w/in 15years.) does not qualify.   Additional Screening:  Hepatitis C Screening: does qualify; Completed 04/19/2021  Vision Screening: Recommended annual ophthalmology exams for early detection of glaucoma and other disorders of the eye. Is the patient up to date with their annual eye exam?  Yes  Who is the provider or what is the name of the office in which the patient attends annual eye exams? [facility] If pt is not established with a provider, would they like to be referred to a provider to establish care? No .   Dental Screening: Recommended annual dental exams for proper oral hygiene  Community Resource Referral / Chronic Care Management: CRR required this visit?  No   CCM required this visit?  No      Plan:     I have personally reviewed and noted the following in the patient's chart:   Medical and social history Use of alcohol, tobacco or illicit drugs  Current medications and supplements including  opioid prescriptions. Patient is currently taking opioid prescriptions. Information provided to patient regarding non-opioid alternatives. Patient advised to discuss non-opioid treatment plan with their provider. Functional ability and status Nutritional status Physical activity Advanced directives List of other physicians Hospitalizations, surgeries, and ER visits in previous 12 months Vitals Screenings to include cognitive, depression, and falls Referrals and appointments  In addition, I have reviewed and discussed with patient certain preventive protocols, quality metrics, and best practice recommendations. A written personalized care plan for preventive services as well as general preventive health recommendations were provided to patient.   Due to this being a telephonic visit, the after visit summary with patients personalized plan was offered to patient via mail or my-chart. Patient would like to access their AVS via my-chart   Jordan Hawks Benno Brensinger, CMA   11/05/2022   Nurse Notes: Dexa Scan ordered during today's visit

## 2022-11-05 NOTE — Patient Instructions (Signed)
Dawn Carlson , Thank you for taking time to come for your Medicare Wellness Visit. I appreciate your ongoing commitment to your health goals. Please review the following plan we discussed and let me know if I can assist you in the future.   These are the goals we discussed:  Goals      Patient Stated     She would like to work on getting her grandson mental health care and create a stable home environment.  She would like to loose some weight. She would like to increase her water intake.      Patient Stated     Patient states her goal is to lose weight        This is a list of the screening recommended for you and due dates:  Health Maintenance  Topic Date Due   Zoster (Shingles) Vaccine (1 of 2) 10/22/2023*   COVID-19 Vaccine (5 - 2023-24 season) 12/15/2023*   Flu Shot  01/16/2023   Medicare Annual Wellness Visit  11/05/2023   Mammogram  10/28/2024   DTaP/Tdap/Td vaccine (2 - Tdap) 01/27/2028   Colon Cancer Screening  09/13/2028   Pneumonia Vaccine  Completed   DEXA scan (bone density measurement)  Completed   Hepatitis C Screening: USPSTF Recommendation to screen - Ages 65-79 yo.  Completed   HPV Vaccine  Aged Out  *Topic was postponed. The date shown is not the original due date.    Advanced directives: Please bring a copy of your health care power of attorney and living will to the office to be added to your chart at your convenience.   Conditions/risks identified: Each day, aim for 6 glasses of water, plenty of protein in your diet and try to get up and walk/ stretch every hour for 5-10 minutes at a time.    Next appointment: Follow up in one year for your annual wellness visit on Nov 11, 2023 at 1:30pm via telephone   Preventive Care 70 Years and Older, Female Preventive care refers to lifestyle choices and visits with your health care provider that can promote health and wellness. What does preventive care include? A yearly physical exam. This is also called an  annual well check. Dental exams once or twice a year. Routine eye exams. Ask your health care provider how often you should have your eyes checked. Personal lifestyle choices, including: Daily care of your teeth and gums. Regular physical activity. Eating a healthy diet. Avoiding tobacco and drug use. Limiting alcohol use. Practicing safe sex. Taking low-dose aspirin every day. Taking vitamin and mineral supplements as recommended by your health care provider. What happens during an annual well check? The services and screenings done by your health care provider during your annual well check will depend on your age, overall health, lifestyle risk factors, and family history of disease. Counseling  Your health care provider may ask you questions about your: Alcohol use. Tobacco use. Drug use. Emotional well-being. Home and relationship well-being. Sexual activity. Eating habits. History of falls. Memory and ability to understand (cognition). Work and work Astronomer. Reproductive health. Screening  You may have the following tests or measurements: Height, weight, and BMI. Blood pressure. Lipid and cholesterol levels. These may be checked every 5 years, or more frequently if you are over 26 years old. Skin check. Lung cancer screening. You may have this screening every year starting at age 32 if you have a 30-pack-year history of smoking and currently smoke or have quit within the past 15  years. Fecal occult blood test (FOBT) of the stool. You may have this test every year starting at age 42. Flexible sigmoidoscopy or colonoscopy. You may have a sigmoidoscopy every 5 years or a colonoscopy every 10 years starting at age 75. Hepatitis C blood test. Hepatitis B blood test. Sexually transmitted disease (STD) testing. Diabetes screening. This is done by checking your blood sugar (glucose) after you have not eaten for a while (fasting). You may have this done every 1-3 years. Bone  density scan. This is done to screen for osteoporosis. You may have this done starting at age 62. Mammogram. This may be done every 1-2 years. Talk to your health care provider about how often you should have regular mammograms. Talk with your health care provider about your test results, treatment options, and if necessary, the need for more tests. Vaccines  Your health care provider may recommend certain vaccines, such as: Influenza vaccine. This is recommended every year. Tetanus, diphtheria, and acellular pertussis (Tdap, Td) vaccine. You may need a Td booster every 10 years. Zoster vaccine. You may need this after age 8. Pneumococcal 13-valent conjugate (PCV13) vaccine. One dose is recommended after age 2. Pneumococcal polysaccharide (PPSV23) vaccine. One dose is recommended after age 53. Talk to your health care provider about which screenings and vaccines you need and how often you need them. This information is not intended to replace advice given to you by your health care provider. Make sure you discuss any questions you have with your health care provider. Document Released: 06/30/2015 Document Revised: 02/21/2016 Document Reviewed: 04/04/2015 Elsevier Interactive Patient Education  2017 [employer].  Fall Prevention in the Home Falls can cause injuries. They can happen to people of all ages. There are many things you can do to make your home safe and to help prevent falls. What can I do on the outside of my home? Regularly fix the edges of walkways and driveways and fix any cracks. Remove anything that might make you trip as you walk through a door, such as a raised step or threshold. Trim any bushes or trees on the path to your home. Use bright outdoor lighting. Clear any walking paths of anything that might make someone trip, such as rocks or tools. Regularly check to see if handrails are loose or broken. Make sure that both sides of any steps have handrails. Any raised  decks and porches should have guardrails on the edges. Have any leaves, snow, or ice cleared regularly. Use sand or salt on walking paths during winter. Clean up any spills in your garage right away. This includes oil or grease spills. What can I do in the bathroom? Use night lights. Install grab bars by the toilet and in the tub and shower. Do not use towel bars as grab bars. Use non-skid mats or decals in the tub or shower. If you need to sit down in the shower, use a plastic, non-slip stool. Keep the floor dry. Clean up any water that spills on the floor as soon as it happens. Remove soap buildup in the tub or shower regularly. Attach bath mats securely with double-sided non-slip rug tape. Do not have throw rugs and other things on the floor that can make you trip. What can I do in the bedroom? Use night lights. Make sure that you have a light by your bed that is easy to reach. Do not use any sheets or blankets that are too big for your bed. They should not hang down  onto the floor. Have a firm chair that has side arms. You can use this for support while you get dressed. Do not have throw rugs and other things on the floor that can make you trip. What can I do in the kitchen? Clean up any spills right away. Avoid walking on wet floors. Keep items that you use a lot in easy-to-reach places. If you need to reach something above you, use a strong step stool that has a grab bar. Keep electrical cords out of the way. Do not use floor polish or wax that makes floors slippery. If you must use wax, use non-skid floor wax. Do not have throw rugs and other things on the floor that can make you trip. What can I do with my stairs? Do not leave any items on the stairs. Make sure that there are handrails on both sides of the stairs and use them. Fix handrails that are broken or loose. Make sure that handrails are as long as the stairways. Check any carpeting to make sure that it is firmly attached  to the stairs. Fix any carpet that is loose or worn. Avoid having throw rugs at the top or bottom of the stairs. If you do have throw rugs, attach them to the floor with carpet tape. Make sure that you have a light switch at the top of the stairs and the bottom of the stairs. If you do not have them, ask someone to add them for you. What else can I do to help prevent falls? Wear shoes that: Do not have high heels. Have rubber bottoms. Are comfortable and fit you well. Are closed at the toe. Do not wear sandals. If you use a stepladder: Make sure that it is fully opened. Do not climb a closed stepladder. Make sure that both sides of the stepladder are locked into place. Ask someone to hold it for you, if possible. Clearly mark and make sure that you can see: Any grab bars or handrails. First and last steps. Where the edge of each step is. Use tools that help you move around (mobility aids) if they are needed. These include: Canes. Walkers. Scooters. Crutches. Turn on the lights when you go into a dark area. Replace any light bulbs as soon as they burn out. Set up your furniture so you have a clear path. Avoid moving your furniture around. If any of your floors are uneven, fix them. If there are any pets around you, be aware of where they are. Review your medicines with your doctor. Some medicines can make you feel dizzy. This can increase your chance of falling. Ask your doctor what other things that you can do to help prevent falls. This information is not intended to replace advice given to you by your health care provider. Make sure you discuss any questions you have with your health care provider. Document Released: 03/30/2009 Document Revised: 11/09/2015 Document Reviewed: 07/08/2014 Elsevier Interactive Patient Education  2017 [employer]   Managing Pain Without Opioids  Opioids are strong medicines used to treat moderate to severe pain. For some people, especially those who  have long-term (chronic) pain, opioids may not be the best choice for pain management due to: Side effects like nausea, constipation, and sleepiness. The risk of addiction (opioid use disorder). The longer you take opioids, the greater your risk of addiction. Pain that lasts for more than 3 months is called chronic pain. Managing chronic pain usually requires more than one approach and is  often provided by a team of health care providers working together (multidisciplinary approach). Pain management may be done at a pain management center or pain clinic. How to manage pain without the use of opioids Use non-opioid medicines Non-opioid medicines for pain may include: Over-the-counter or prescription non-steroidal anti-inflammatory drugs (NSAIDs). These may be the first medicines used for pain. They work well for muscle and bone pain, and they reduce swelling. Acetaminophen. This over-the-counter medicine may work well for milder pain but not swelling. Antidepressants. These may be used to treat chronic pain. A certain type of antidepressant (tricyclics) is often used. These medicines are given in lower doses for pain than when used for depression. Anticonvulsants. These are usually used to treat seizures but may also reduce nerve (neuropathic) pain. Muscle relaxants. These relieve pain caused by sudden muscle tightening (spasms). You may also use a pain medicine that is applied to the skin as a patch, cream, or gel (topical analgesic), such as a numbing medicine. These may cause fewer side effects than medicines taken by mouth. Do certain therapies as directed Some therapies can help with pain management. They include: Physical therapy. You will do exercises to gain strength and flexibility. A physical therapist may teach you exercises to move and stretch parts of your body that are weak, stiff, or painful. You can learn these exercises at physical therapy visits and practice them at home. Physical  therapy may also involve: Massage. Heat wraps or applying heat or cold to affected areas. Electrical signals that interrupt pain signals (transcutaneous electrical nerve stimulation, TENS). Weak lasers that reduce pain and swelling (low-level laser therapy). Signals from your body that help you learn to regulate pain (biofeedback). Occupational therapy. This helps you to learn ways to function at home and work with less pain. Recreational therapy. This involves trying new activities or hobbies, such as a physical activity or drawing. Mental health therapy, including: Cognitive behavioral therapy (CBT). This helps you learn coping skills for dealing with pain. Acceptance and commitment therapy (ACT) to change the way you think and react to pain. Relaxation therapies, including muscle relaxation exercises and mindfulness-based stress reduction. Pain management counseling. This may be individual, family, or group counseling.  Receive medical treatments Medical treatments for pain management include: Nerve block injections. These may include a pain blocker and anti-inflammatory medicines. You may have injections: Near the spine to relieve chronic back or neck pain. Into joints to relieve back or joint pain. Into nerve areas that supply a painful area to relieve body pain. Into muscles (trigger point injections) to relieve some painful muscle conditions. A medical device placed near your spine to help block pain signals and relieve nerve pain or chronic back pain (spinal cord stimulation device). Acupuncture. Follow these instructions at home Medicines Take over-the-counter and prescription medicines only as told by your health care provider. If you are taking pain medicine, ask your health care providers about possible side effects to watch out for. Do not drive or use heavy machinery while taking prescription opioid pain medicine. Lifestyle  Do not use drugs or alcohol to reduce pain. If  you drink alcohol, limit how much you have to: 0-1 drink a day for women who are not pregnant. 0-2 drinks a day for men. Know how much alcohol is in a drink. In the [place] one drink equals one 12 oz bottle of beer (355 mL), one 5 oz glass of wine (148 mL), or one 1 oz glass of hard liquor (44 mL). Do not  use any products that contain nicotine or tobacco. These products include cigarettes, chewing tobacco, and vaping devices, such as e-cigarettes. If you need help quitting, ask your health care provider. Eat a healthy diet and maintain a healthy weight. Poor diet and excess weight may make pain worse. Eat foods that are high in fiber. These include fresh fruits and vegetables, whole grains, and beans. Limit foods that are high in fat and processed sugars, such as fried and sweet foods. Exercise regularly. Exercise lowers stress and may help relieve pain. Ask your health care provider what activities and exercises are safe for you. If your health care provider approves, join an exercise class that combines movement and stress reduction. Examples include yoga and tai chi. Get enough sleep. Lack of sleep may make pain worse. Lower stress as much as possible. Practice stress reduction techniques as told by your therapist. General instructions Work with all your pain management providers to find the treatments that work best for you. You are an important member of your pain management team. There are many things you can do to reduce pain on your own. Consider joining an online or in-person support group for people who have chronic pain. Keep all follow-up visits. This is important. Where to find more information You can find more information about managing pain without opioids from: American Academy of Pain Medicine: painmed.org Institute for Chronic Pain: instituteforchronicpain.org American Chronic Pain Association: theacpa.org Contact a health care provider if: You have side effects from pain  medicine. Your pain gets worse or does not get better with treatments or home therapy. You are struggling with anxiety or depression. Summary Many types of pain can be managed without opioids. Chronic pain may respond better to pain management without opioids. Pain is best managed when you and a team of health care providers work together. Pain management without opioids may include non-opioid medicines, medical treatments, physical therapy, mental health therapy, and lifestyle changes. Tell your health care providers if your pain gets worse or is not being managed well enough. This information is not intended to replace advice given to you by your health care provider. Make sure you discuss any questions you have with your health care provider. Document Revised: 09/13/2020 Document Reviewed: 09/13/2020 Elsevier Patient Education  2023 [employer].

## 2022-11-12 ENCOUNTER — Ambulatory Visit (HOSPITAL_BASED_OUTPATIENT_CLINIC_OR_DEPARTMENT_OTHER): Payer: Medicare PPO

## 2022-11-12 DIAGNOSIS — R7303 Prediabetes: Secondary | ICD-10-CM

## 2022-11-12 DIAGNOSIS — E039 Hypothyroidism, unspecified: Secondary | ICD-10-CM | POA: Diagnosis not present

## 2022-11-13 LAB — TSH: TSH: 1.83 u[IU]/mL (ref 0.450–4.500)

## 2022-11-13 LAB — HEMOGLOBIN A1C
Est. average glucose Bld gHb Est-mCnc: 134 mg/dL
Hgb A1c MFr Bld: 6.3 % — ABNORMAL HIGH (ref 4.8–5.6)

## 2022-11-19 ENCOUNTER — Ambulatory Visit (HOSPITAL_BASED_OUTPATIENT_CLINIC_OR_DEPARTMENT_OTHER): Payer: Medicare PPO | Admitting: Family Medicine

## 2022-12-04 ENCOUNTER — Encounter (HOSPITAL_BASED_OUTPATIENT_CLINIC_OR_DEPARTMENT_OTHER): Payer: Self-pay | Admitting: Family Medicine

## 2022-12-04 ENCOUNTER — Other Ambulatory Visit (HOSPITAL_BASED_OUTPATIENT_CLINIC_OR_DEPARTMENT_OTHER): Payer: Self-pay

## 2022-12-04 ENCOUNTER — Ambulatory Visit (INDEPENDENT_AMBULATORY_CARE_PROVIDER_SITE_OTHER): Payer: Medicare PPO | Admitting: Family Medicine

## 2022-12-04 VITALS — BP 149/71 | HR 67 | Ht 66.0 in | Wt 236.3 lb

## 2022-12-04 DIAGNOSIS — E039 Hypothyroidism, unspecified: Secondary | ICD-10-CM | POA: Diagnosis not present

## 2022-12-04 DIAGNOSIS — R7303 Prediabetes: Secondary | ICD-10-CM | POA: Diagnosis not present

## 2022-12-04 MED ORDER — LEVOTHYROXINE SODIUM 75 MCG PO TABS
75.0000 ug | ORAL_TABLET | Freq: Every day | ORAL | 1 refills | Status: DC
  Filled 2022-12-04: qty 90, 90d supply, fill #0
  Filled 2023-04-16: qty 90, 90d supply, fill #1

## 2022-12-04 NOTE — Assessment & Plan Note (Signed)
Most recent hemoglobin A1c remains within prediabetes range, had increased slightly from 6.1 to 6.3%.  She previously was utilizing Ozempic, however this is not being covered as well by insurance and so she has stopped taking this due to cost.  She continues with lifestyle modifications.  Admits that she needs to get back to walking more.  No new symptoms such as polyuria or polydipsia. Recommend continuing with lifestyle modifications, we will check hemoglobin A1c shortly before next appointment in about 3 to 4 months

## 2022-12-04 NOTE — Assessment & Plan Note (Signed)
Patient is doing well with slightly increased dose of levothyroxine.  Most recent TSH showed improvement and is now within normal range.  She denies any new symptoms, no chest pain, palpitations, changes in bowel habits, temperature intolerances. Vital signs stable, blood pressure slightly elevated, did improve on recheck today At this time, we can continue with current dose of levothyroxine, no changes made to medication Will plan for follow-up in about 3 to 4 months with recheck of TSH around that time

## 2022-12-04 NOTE — Patient Instructions (Signed)

## 2022-12-04 NOTE — Progress Notes (Signed)
    Procedures performed today:    None.  Independent interpretation of notes and tests performed by another provider:   None.  Brief History, Exam, Impression, and Recommendations:    BP (!) 149/71   Pulse 67   Ht 5\' 6"  (1.676 m)   Wt 236 lb 4.8 oz (107.2 kg)   SpO2 99%   BMI 38.14 kg/m   Hypothyroidism, unspecified type Assessment & Plan: Patient is doing well with slightly increased dose of levothyroxine.  Most recent TSH showed improvement and is now within normal range.  She denies any new symptoms, no chest pain, palpitations, changes in bowel habits, temperature intolerances. Vital signs stable, blood pressure slightly elevated, did improve on recheck today At this time, we can continue with current dose of levothyroxine, no changes made to medication Will plan for follow-up in about 3 to 4 months with recheck of TSH around that time  Orders: -     Levothyroxine Sodium; Take 1 tablet (75 mcg total) by mouth daily.  Dispense: 90 tablet; Refill: 1 -     TSH; Future  Pre-diabetes Assessment & Plan: Most recent hemoglobin A1c remains within prediabetes range, had increased slightly from 6.1 to 6.3%.  She previously was utilizing Ozempic, however this is not being covered as well by insurance and so she has stopped taking this due to cost.  She continues with lifestyle modifications.  Admits that she needs to get back to walking more.  No new symptoms such as polyuria or polydipsia. Recommend continuing with lifestyle modifications, we will check hemoglobin A1c shortly before next appointment in about 3 to 4 months  Orders: -     Hemoglobin A1c; Future  Return in about 3 months (around 03/06/2023) for prediabetes, hypothyroidism.   ___________________________________________ Janaisha Tolsma de [place], MD, ABFM, CAQSM Primary Care and Sports Medicine [facility]

## 2022-12-10 ENCOUNTER — Other Ambulatory Visit (HOSPITAL_BASED_OUTPATIENT_CLINIC_OR_DEPARTMENT_OTHER): Payer: Self-pay

## 2022-12-11 ENCOUNTER — Other Ambulatory Visit (HOSPITAL_BASED_OUTPATIENT_CLINIC_OR_DEPARTMENT_OTHER): Payer: Self-pay

## 2022-12-26 NOTE — Progress Notes (Signed)
[facility] Quality Team Note  Name: Dawn Carlson Date of Birth: [birth year] MRN: [id number] Date: 12/26/2022  [facility] Quality Team has reviewed this patient's chart, please see recommendations below:  [facility] Quality Other; (KED GAP- PATIENT NEEDS UACR. UNSUCCESSFUL OUTREACH TO PATIENT FOR COLON CANCER SCREENING. GSD- A1C HAS BEEN ORDERED BUT NOT YET COLLECTED.)

## 2022-12-30 ENCOUNTER — Telehealth (HOSPITAL_BASED_OUTPATIENT_CLINIC_OR_DEPARTMENT_OTHER): Payer: Self-pay | Admitting: *Deleted

## 2022-12-30 ENCOUNTER — Encounter (HOSPITAL_BASED_OUTPATIENT_CLINIC_OR_DEPARTMENT_OTHER): Payer: Self-pay | Admitting: *Deleted

## 2022-12-30 NOTE — Telephone Encounter (Signed)
Called to see if patient had colon cancer screening in the past. This was done at [facility] in [place]. Records requested.

## 2022-12-31 ENCOUNTER — Encounter (HOSPITAL_BASED_OUTPATIENT_CLINIC_OR_DEPARTMENT_OTHER): Payer: Self-pay | Admitting: Family Medicine

## 2023-03-24 ENCOUNTER — Other Ambulatory Visit (HOSPITAL_BASED_OUTPATIENT_CLINIC_OR_DEPARTMENT_OTHER): Payer: Self-pay | Admitting: Family Medicine

## 2023-03-24 ENCOUNTER — Other Ambulatory Visit (HOSPITAL_BASED_OUTPATIENT_CLINIC_OR_DEPARTMENT_OTHER): Payer: PRIVATE HEALTH INSURANCE

## 2023-03-24 ENCOUNTER — Ambulatory Visit (HOSPITAL_BASED_OUTPATIENT_CLINIC_OR_DEPARTMENT_OTHER): Payer: Medicare PPO | Admitting: *Deleted

## 2023-03-24 VITALS — BP 138/77 | HR 74 | Temp 97.9°F | Ht 66.0 in | Wt 236.3 lb

## 2023-03-24 DIAGNOSIS — E039 Hypothyroidism, unspecified: Secondary | ICD-10-CM | POA: Diagnosis not present

## 2023-03-24 DIAGNOSIS — Z23 Encounter for immunization: Secondary | ICD-10-CM

## 2023-03-24 DIAGNOSIS — R7303 Prediabetes: Secondary | ICD-10-CM | POA: Diagnosis not present

## 2023-03-25 LAB — HEMOGLOBIN A1C
Est. average glucose Bld gHb Est-mCnc: 128 mg/dL
Hgb A1c MFr Bld: 6.1 % — ABNORMAL HIGH (ref 4.8–5.6)

## 2023-03-25 LAB — TSH: TSH: 1.57 u[IU]/mL (ref 0.450–4.500)

## 2023-03-25 NOTE — Progress Notes (Signed)
Patient was seen for her flu shot. Vitals obtained and flu shot was administered. Patient tolerated it well.

## 2023-03-31 ENCOUNTER — Other Ambulatory Visit (HOSPITAL_BASED_OUTPATIENT_CLINIC_OR_DEPARTMENT_OTHER): Payer: Self-pay

## 2023-03-31 MED ORDER — COVID-19 MRNA VAC-TRIS(PFIZER) 30 MCG/0.3ML IM SUSY
0.3000 mL | PREFILLED_SYRINGE | Freq: Once | INTRAMUSCULAR | 0 refills | Status: AC
  Filled 2023-03-31: qty 0.3, 1d supply, fill #0

## 2023-04-04 ENCOUNTER — Ambulatory Visit (HOSPITAL_BASED_OUTPATIENT_CLINIC_OR_DEPARTMENT_OTHER): Payer: PRIVATE HEALTH INSURANCE | Admitting: Family Medicine

## 2023-04-16 ENCOUNTER — Other Ambulatory Visit (HOSPITAL_BASED_OUTPATIENT_CLINIC_OR_DEPARTMENT_OTHER): Payer: Self-pay

## 2023-04-18 ENCOUNTER — Other Ambulatory Visit (HOSPITAL_BASED_OUTPATIENT_CLINIC_OR_DEPARTMENT_OTHER): Payer: Self-pay

## 2023-04-21 ENCOUNTER — Other Ambulatory Visit (HOSPITAL_BASED_OUTPATIENT_CLINIC_OR_DEPARTMENT_OTHER): Payer: Self-pay

## 2023-04-23 ENCOUNTER — Other Ambulatory Visit (HOSPITAL_BASED_OUTPATIENT_CLINIC_OR_DEPARTMENT_OTHER): Payer: Self-pay

## 2023-04-23 DIAGNOSIS — H5203 Hypermetropia, bilateral: Secondary | ICD-10-CM | POA: Diagnosis not present

## 2023-04-23 DIAGNOSIS — H40013 Open angle with borderline findings, low risk, bilateral: Secondary | ICD-10-CM | POA: Diagnosis not present

## 2023-04-23 DIAGNOSIS — H2513 Age-related nuclear cataract, bilateral: Secondary | ICD-10-CM | POA: Diagnosis not present

## 2023-04-23 DIAGNOSIS — H524 Presbyopia: Secondary | ICD-10-CM | POA: Diagnosis not present

## 2023-04-23 DIAGNOSIS — H18513 Endothelial corneal dystrophy, bilateral: Secondary | ICD-10-CM | POA: Diagnosis not present

## 2023-04-23 DIAGNOSIS — H52223 Regular astigmatism, bilateral: Secondary | ICD-10-CM | POA: Diagnosis not present

## 2023-04-23 MED ORDER — SODIUM CHLORIDE (HYPERTONIC) 5 % OP SOLN
1.0000 [drp] | Freq: Two times a day (BID) | OPHTHALMIC | 5 refills | Status: DC
  Filled 2023-04-23: qty 15, 150d supply, fill #0
  Filled 2023-08-08: qty 15, 150d supply, fill #1
  Filled 2023-08-08: qty 15, 75d supply, fill #1
  Filled 2023-12-06: qty 15, 75d supply, fill #2
  Filled 2024-02-18: qty 15, 75d supply, fill #3

## 2023-04-24 ENCOUNTER — Other Ambulatory Visit (HOSPITAL_BASED_OUTPATIENT_CLINIC_OR_DEPARTMENT_OTHER): Payer: Self-pay

## 2023-07-22 DIAGNOSIS — H18513 Endothelial corneal dystrophy, bilateral: Secondary | ICD-10-CM | POA: Diagnosis not present

## 2023-07-22 DIAGNOSIS — H40013 Open angle with borderline findings, low risk, bilateral: Secondary | ICD-10-CM | POA: Diagnosis not present

## 2023-07-27 NOTE — Progress Notes (Signed)
This encounter was created in error - please disregard.

## 2023-08-08 ENCOUNTER — Other Ambulatory Visit (HOSPITAL_BASED_OUTPATIENT_CLINIC_OR_DEPARTMENT_OTHER): Payer: Self-pay

## 2023-09-24 ENCOUNTER — Other Ambulatory Visit (HOSPITAL_BASED_OUTPATIENT_CLINIC_OR_DEPARTMENT_OTHER): Payer: Self-pay

## 2023-09-24 MED ORDER — LATANOPROST 0.005 % OP SOLN
1.0000 [drp] | Freq: Every evening | OPHTHALMIC | 11 refills | Status: AC
  Filled 2023-09-24: qty 5, 90d supply, fill #0
  Filled 2023-12-06: qty 5, 90d supply, fill #1

## 2023-10-21 DIAGNOSIS — H401131 Primary open-angle glaucoma, bilateral, mild stage: Secondary | ICD-10-CM | POA: Diagnosis not present

## 2023-11-10 ENCOUNTER — Observation Stay (HOSPITAL_BASED_OUTPATIENT_CLINIC_OR_DEPARTMENT_OTHER): Admission: EM | Admit: 2023-11-10 | Discharge: 2023-11-11 | Disposition: A | Discharge status: Home / Self Care

## 2023-11-10 ENCOUNTER — Inpatient Hospital Stay (HOSPITAL_COMMUNITY)

## 2023-11-10 ENCOUNTER — Encounter (HOSPITAL_BASED_OUTPATIENT_CLINIC_OR_DEPARTMENT_OTHER): Payer: Self-pay | Admitting: Emergency Medicine

## 2023-11-10 ENCOUNTER — Emergency Department (HOSPITAL_BASED_OUTPATIENT_CLINIC_OR_DEPARTMENT_OTHER)

## 2023-11-10 ENCOUNTER — Emergency Department (HOSPITAL_COMMUNITY)

## 2023-11-10 ENCOUNTER — Other Ambulatory Visit: Payer: Self-pay

## 2023-11-10 DIAGNOSIS — I639 Cerebral infarction, unspecified: Secondary | ICD-10-CM

## 2023-11-10 DIAGNOSIS — Z8673 Personal history of transient ischemic attack (TIA), and cerebral infarction without residual deficits: Secondary | ICD-10-CM | POA: Diagnosis not present

## 2023-11-10 DIAGNOSIS — I6389 Other cerebral infarction: Secondary | ICD-10-CM | POA: Diagnosis not present

## 2023-11-10 DIAGNOSIS — E039 Hypothyroidism, unspecified: Secondary | ICD-10-CM | POA: Diagnosis not present

## 2023-11-10 DIAGNOSIS — Z7902 Long term (current) use of antithrombotics/antiplatelets: Secondary | ICD-10-CM | POA: Insufficient documentation

## 2023-11-10 DIAGNOSIS — I6789 Other cerebrovascular disease: Secondary | ICD-10-CM | POA: Diagnosis not present

## 2023-11-10 DIAGNOSIS — E785 Hyperlipidemia, unspecified: Secondary | ICD-10-CM | POA: Diagnosis not present

## 2023-11-10 DIAGNOSIS — R42 Dizziness and giddiness: Secondary | ICD-10-CM | POA: Diagnosis present

## 2023-11-10 DIAGNOSIS — Z79899 Other long term (current) drug therapy: Secondary | ICD-10-CM | POA: Diagnosis not present

## 2023-11-10 DIAGNOSIS — I63342 Cerebral infarction due to thrombosis of left cerebellar artery: Secondary | ICD-10-CM

## 2023-11-10 DIAGNOSIS — I10 Essential (primary) hypertension: Secondary | ICD-10-CM | POA: Diagnosis not present

## 2023-11-10 DIAGNOSIS — Z7982 Long term (current) use of aspirin: Secondary | ICD-10-CM | POA: Diagnosis not present

## 2023-11-10 DIAGNOSIS — M79605 Pain in left leg: Secondary | ICD-10-CM | POA: Diagnosis not present

## 2023-11-10 DIAGNOSIS — I700 Atherosclerosis of aorta: Secondary | ICD-10-CM | POA: Diagnosis not present

## 2023-11-10 DIAGNOSIS — Z6837 Body mass index (BMI) 37.0-37.9, adult: Secondary | ICD-10-CM | POA: Diagnosis not present

## 2023-11-10 DIAGNOSIS — R29818 Other symptoms and signs involving the nervous system: Secondary | ICD-10-CM | POA: Diagnosis not present

## 2023-11-10 DIAGNOSIS — I6782 Cerebral ischemia: Secondary | ICD-10-CM | POA: Diagnosis not present

## 2023-11-10 DIAGNOSIS — R1111 Vomiting without nausea: Secondary | ICD-10-CM | POA: Diagnosis not present

## 2023-11-10 DIAGNOSIS — R112 Nausea with vomiting, unspecified: Secondary | ICD-10-CM | POA: Insufficient documentation

## 2023-11-10 DIAGNOSIS — G459 Transient cerebral ischemic attack, unspecified: Secondary | ICD-10-CM | POA: Diagnosis not present

## 2023-11-10 DIAGNOSIS — R7303 Prediabetes: Secondary | ICD-10-CM | POA: Diagnosis not present

## 2023-11-10 DIAGNOSIS — Z743 Need for continuous supervision: Secondary | ICD-10-CM | POA: Diagnosis not present

## 2023-11-10 LAB — COMPREHENSIVE METABOLIC PANEL WITH GFR
ALT: 15 U/L (ref 0–44)
AST: 27 U/L (ref 15–41)
Albumin: 4.3 g/dL (ref 3.5–5.0)
Alkaline Phosphatase: 104 U/L (ref 38–126)
Anion gap: 16 — ABNORMAL HIGH (ref 5–15)
BUN: 15 mg/dL (ref 8–23)
CO2: 20 mmol/L — ABNORMAL LOW (ref 22–32)
Calcium: 10.2 mg/dL (ref 8.9–10.3)
Chloride: 102 mmol/L (ref 98–111)
Creatinine, Ser: 0.95 mg/dL (ref 0.44–1.00)
GFR, Estimated: >60 mL/min (ref >60)
Glucose, Bld: 177 mg/dL — ABNORMAL HIGH (ref 70–99)
Potassium: 4.2 mmol/L (ref 3.5–5.1)
Sodium: 137 mmol/L (ref 135–145)
Total Bilirubin: 0.6 mg/dL (ref 0.0–1.2)
Total Protein: 8.7 g/dL — ABNORMAL HIGH (ref 6.5–8.1)

## 2023-11-10 LAB — CBC
HCT: 41 % (ref 36.0–46.0)
Hemoglobin: 14.5 g/dL (ref 12.0–15.0)
MCH: 28.2 pg (ref 26.0–34.0)
MCHC: 35.4 g/dL (ref 30.0–36.0)
MCV: 79.6 fL — ABNORMAL LOW (ref 80.0–100.0)
Platelets: 229 10*3/uL (ref 150–400)
RBC: 5.15 MIL/uL — ABNORMAL HIGH (ref 3.87–5.11)
RDW: 14.5 % (ref 11.5–15.5)
WBC: 11.4 10*3/uL — ABNORMAL HIGH (ref 4.0–10.5)
nRBC: 0 % (ref 0.0–0.2)

## 2023-11-10 LAB — URINALYSIS, ROUTINE W REFLEX MICROSCOPIC
Bilirubin Urine: NEGATIVE
Glucose, UA: NEGATIVE mg/dL
Hgb urine dipstick: NEGATIVE
Ketones, ur: 20 mg/dL — AB
Leukocytes,Ua: NEGATIVE
Nitrite: NEGATIVE
Protein, ur: 100 mg/dL — AB
Specific Gravity, Urine: 1.031 — ABNORMAL HIGH (ref 1.005–1.030)
pH: 6 (ref 5.0–8.0)

## 2023-11-10 LAB — LIPASE, BLOOD: Lipase: 23 U/L (ref 11–51)

## 2023-11-10 MED ORDER — ONDANSETRON HCL 4 MG/2ML IJ SOLN
INTRAMUSCULAR | Status: AC
  Filled 2023-11-10: qty 2

## 2023-11-10 MED ORDER — DIAZEPAM 5 MG/ML IJ SOLN
5.0000 mg | Freq: Once | INTRAMUSCULAR | Status: AC
  Administered 2023-11-10: 5 mg via INTRAVENOUS
  Filled 2023-11-10: qty 2

## 2023-11-10 MED ORDER — ACETAMINOPHEN 650 MG RE SUPP: 650.0000 mg | RECTAL | Status: DC | PRN

## 2023-11-10 MED ORDER — ASPIRIN 325 MG PO TABS
325.0000 mg | ORAL_TABLET | Freq: Every day | ORAL | Status: DC
  Administered 2023-11-10: 325 mg via ORAL
  Filled 2023-11-10: qty 1

## 2023-11-10 MED ORDER — ACETAMINOPHEN 160 MG/5ML PO SOLN: 650.0000 mg | ORAL | Status: DC | PRN

## 2023-11-10 MED ORDER — CLOPIDOGREL BISULFATE 75 MG PO TABS
75.0000 mg | ORAL_TABLET | Freq: Every day | ORAL | Status: DC
  Administered 2023-11-11: 75 mg via ORAL
  Filled 2023-11-10: qty 1

## 2023-11-10 MED ORDER — SENNOSIDES-DOCUSATE SODIUM 8.6-50 MG PO TABS: 1.0000 | ORAL_TABLET | Freq: Every evening | ORAL | Status: DC | PRN

## 2023-11-10 MED ORDER — IOHEXOL 350 MG/ML SOLN
75.0000 mL | Freq: Once | INTRAVENOUS | Status: AC | PRN
  Administered 2023-11-10: 75 mL via INTRAVENOUS

## 2023-11-10 MED ORDER — LATANOPROST 0.005 % OP SOLN
1.0000 [drp] | Freq: Every evening | OPHTHALMIC | Status: DC
  Filled 2023-11-10: qty 2.5

## 2023-11-10 MED ORDER — SODIUM CHLORIDE 0.9 % IV BOLUS
500.0000 mL | Freq: Once | INTRAVENOUS | Status: AC
  Administered 2023-11-10: 500 mL via INTRAVENOUS

## 2023-11-10 MED ORDER — MECLIZINE HCL 25 MG PO TABS
25.0000 mg | ORAL_TABLET | Freq: Once | ORAL | Status: AC
  Administered 2023-11-10: 25 mg via ORAL
  Filled 2023-11-10: qty 1

## 2023-11-10 MED ORDER — CLOPIDOGREL BISULFATE 75 MG PO TABS
75.0000 mg | ORAL_TABLET | Freq: Once | ORAL | Status: AC
  Administered 2023-11-10: 75 mg via ORAL
  Filled 2023-11-10: qty 1

## 2023-11-10 MED ORDER — SODIUM CHLORIDE (HYPERTONIC) 5 % OP SOLN
1.0000 [drp] | Freq: Two times a day (BID) | OPHTHALMIC | Status: DC
  Administered 2023-11-11: 1 [drp] via OPHTHALMIC
  Filled 2023-11-10: qty 15

## 2023-11-10 MED ORDER — STROKE: EARLY STAGES OF RECOVERY BOOK
Freq: Once | Status: AC
  Filled 2023-11-10: qty 1

## 2023-11-10 MED ORDER — ACETAMINOPHEN 325 MG PO TABS: 650.0000 mg | ORAL_TABLET | ORAL | Status: DC | PRN

## 2023-11-10 MED ORDER — ASPIRIN 81 MG PO CHEW
81.0000 mg | CHEWABLE_TABLET | Freq: Every day | ORAL | Status: DC
  Administered 2023-11-11: 81 mg via ORAL
  Filled 2023-11-10: qty 1

## 2023-11-10 NOTE — ED Notes (Signed)
 Pt is unable to obtain urine sample, pt is aware that one is needed.

## 2023-11-10 NOTE — ED Notes (Signed)
 Pt daughter would like a call when pt goes upstairs, number in chart.

## 2023-11-10 NOTE — ED Notes (Signed)
 Patient transported to MRI

## 2023-11-10 NOTE — ED Notes (Signed)
 Pt BIB [facility] from [facility] due to left leg pain, dizziness and vomiting.  Pt transferred to get CTA and MRI at this location. VSS.

## 2023-11-10 NOTE — H&P (Signed)
 History and Physical    [name] WUJ:[id number] DOB: 19-Feb-1951 DOA: 11/10/2023  PCP: de [place], Raymond J, MD  Patient coming from: [facility]  I have personally briefly reviewed patient's old medical records in [facility]  Chief Complaint: dizziness  HPI: Dawn Carlson is a 73 y.o. female with medical history significant of  Hypothyroidism who presents to [facility] with complaint of vertigo /spinning room on waking this am.  She states symptoms are persistent and around 1 hour pta she started to have nausea and vomiting. she states she was in her usual state of health until yesterday when she noted leg pain. However she notes she did not have symptoms for vertigo yesterday.  she notes no HA /vision changes, paresthesias , focal weakness or paresthesias. she also denied any difficulty swallowing but does note she felt off balanced. She states he symptoms have improved but she still notes mild vertigo.  ED Course:  Vitals: Afeb, bp 153/106, hr 87, rr 18 ,sat 98%  EKG: nsr Wbc 11.4, hgb 14.5, plt 229 Lipase 23  Na 137, K 4.2, CL 102, Co2 20, glu 177 cr 0.95  CTA: no LVO CTH: old cerebellar infarct UA:neg MRI IMPRESSION: 1. Small focus of acute ischemia within the superior left cerebellar vermis. No hemorrhage or mass effect. 2. Old right cerebellar infarct.   Tx diazepam ,zofran  , ns 500cc , antivert   Review of Systems: As per HPI otherwise 10 point review of systems negative.   Past Medical History:  Diagnosis Date   Rash 03/12/2021   Thyroid  disease     Past Surgical History:  Procedure Laterality Date   CARPAL TUNNEL RELEASE       reports that she has never smoked. She has never used smokeless tobacco. She reports that she does not drink alcohol and does not use drugs.  No Known Allergies  Family History  Problem Relation Age of Onset   Hypertension Mother    Heart disease Mother    Diabetes Mother    Diabetes Sister    Diabetes Brother    Diabetes Other     Schizophrenia Grandson    Breast cancer Neg Hx     Prior to Admission medications   Medication Sig Start Date End Date Taking? Authorizing Provider  latanoprost  (XALATAN ) 0.005 % ophthalmic solution Place 1 drop into affected eye(s) every evening. 09/24/23  Yes   levothyroxine  (SYNTHROID ) 75 MCG tablet Take 1 tablet (75 mcg total) by mouth daily. Patient not taking: Reported on 11/10/2023 12/04/22   de [place], Alonza Jansky, MD  sodium chloride  (MURO 128) 5 % ophthalmic solution Place 1 drop into both eyes 2 (two) times daily. 04/23/23  Yes     Physical Exam: Vitals:   11/10/23 1530 11/10/23 1600 11/10/23 1744 11/10/23 1830  BP: (!) 142/87 130/87 (!) 176/92 (!) 166/95  Pulse: 85 87 73 68  Resp: 15 14 14 13   Temp: 98 F (36.7 C)  98 F (36.7 C)   TempSrc: Oral  Oral   SpO2: 93% 95% 100% 100%  Weight:      Height:        Constitutional: NAD, calm, comfortable Vitals:   11/10/23 1530 11/10/23 1600 11/10/23 1744 11/10/23 1830  BP: (!) 142/87 130/87 (!) 176/92 (!) 166/95  Pulse: 85 87 73 68  Resp: 15 14 14 13   Temp: 98 F (36.7 C)  98 F (36.7 C)   TempSrc: Oral  Oral   SpO2: 93% 95% 100% 100%  Weight:  Height:       Eyes: PERRL, lids and conjunctivae normal ENMT: Mucous membranes are moist. Posterior pharynx clear of any exudate or lesions.Normal dentition.  Neck: normal, supple, no masses, no thyromegaly Respiratory: clear to auscultation bilaterally, no wheezing, no crackles. Normal respiratory effort. No accessory muscle use.  Cardiovascular: Regular rate and rhythm, no murmurs / rubs / gallops. No extremity edema. 2+ pedal pulses.  Abdomen: no tenderness, no masses palpated. No hepatosplenomegaly. Bowel sounds positive.  Musculoskeletal: no clubbing / cyanosis. No joint deformity upper and lower extremities. Good ROM, no contractures. Normal muscle tone.  Skin: no rashes, lesions, ulcers. No induration Neurologic: CN 2-12 grossly intact. Sensation intact, l. Strength 5/5 in  all 4.  Psychiatric: Normal judgment and insight. Alert and oriented x 3. Normal mood.    Labs on Admission: I have personally reviewed following labs and imaging studies  CBC: Recent Labs  Lab 11/10/23 1447  WBC 11.4*  HGB 14.5  HCT 41.0  MCV 79.6*  PLT 229   Basic Metabolic Panel: Recent Labs  Lab 11/10/23 1447  NA 137  K 4.2  CL 102  CO2 20*  GLUCOSE 177*  BUN 15  CREATININE 0.95  CALCIUM 10.2   GFR: Estimated Creatinine Clearance: 68.8 mL/min (by C-G formula based on SCr of 0.95 mg/dL). Liver Function Tests: Recent Labs  Lab 11/10/23 1447  AST 27  ALT 15  ALKPHOS 104  BILITOT 0.6  PROT 8.7*  ALBUMIN 4.3   Recent Labs  Lab 11/10/23 1447  LIPASE 23   No results for input(s): "AMMONIA" in the last 168 hours. Coagulation Profile: No results for input(s): "INR", "PROTIME" in the last 168 hours. Cardiac Enzymes: No results for input(s): "CKTOTAL", "CKMB", "CKMBINDEX", "TROPONINI" in the last 168 hours. BNP (last 3 results) No results for input(s): "PROBNP" in the last 8760 hours. HbA1C: No results for input(s): "HGBA1C" in the last 72 hours. CBG: No results for input(s): "GLUCAP" in the last 168 hours. Lipid Profile: No results for input(s): "CHOL", "HDL", "LDLCALC", "TRIG", "CHOLHDL", "LDLDIRECT" in the last 72 hours. Thyroid  Function Tests: No results for input(s): "TSH", "T4TOTAL", "FREET4", "T3FREE", "THYROIDAB" in the last 72 hours. Anemia Panel: No results for input(s): "VITAMINB12", "FOLATE", "FERRITIN", "TIBC", "IRON", "RETICCTPCT" in the last 72 hours. Urine analysis:    Component Value Date/Time   COLORURINE YELLOW 11/10/2023 2124   APPEARANCEUR CLEAR 11/10/2023 2124   LABSPEC 1.031 (H) 11/10/2023 2124   PHURINE 6.0 11/10/2023 2124   GLUCOSEU NEGATIVE 11/10/2023 2124   HGBUR NEGATIVE 11/10/2023 2124   BILIRUBINUR NEGATIVE 11/10/2023 2124   KETONESUR 20 (A) 11/10/2023 2124   PROTEINUR 100 (A) 11/10/2023 2124   NITRITE NEGATIVE  11/10/2023 2124   LEUKOCYTESUR NEGATIVE 11/10/2023 2124    Radiological Exams on Admission: CT C-SPINE NO CHARGE Result Date: 11/10/2023 CLINICAL DATA:  Vertigo and left leg pain EXAM: CT CERVICAL SPINE WITHOUT CONTRAST TECHNIQUE: Multidetector CT imaging of the cervical spine was performed without intravenous contrast. Multiplanar CT image reconstructions were also generated. RADIATION DOSE REDUCTION: This exam was performed according to the departmental dose-optimization program which includes automated exposure control, adjustment of the mA and/or kV according to patient size and/or use of iterative reconstruction technique. COMPARISON:  None Available. FINDINGS: Alignment: No static subluxation. Facets are aligned. Occipital condyles and the lateral masses of C1 and C2 are normally approximated. Skull base and vertebrae: No acute fracture. Soft tissues and spinal canal: No prevertebral fluid or swelling. No visible canal hematoma. Disc levels: No  advanced spinal canal or neural foraminal stenosis. Upper chest: No pneumothorax, pulmonary nodule or pleural effusion. Other: Normal visualized paraspinal cervical soft tissues. IMPRESSION: No acute fracture or static subluxation of the cervical spine. Electronically Signed   By: Juanetta Nordmann M.D.   On: 11/10/2023 19:53   CT ANGIO HEAD NECK W WO CM Result Date: 11/10/2023 CLINICAL DATA:  Vertigo and fall EXAM: CT ANGIOGRAPHY HEAD AND NECK WITH AND WITHOUT CONTRAST TECHNIQUE: Multidetector CT imaging of the head and neck was performed using the standard protocol during bolus administration of intravenous contrast. Multiplanar CT image reconstructions and MIPs were obtained to evaluate the vascular anatomy. Carotid stenosis measurements (when applicable) are obtained utilizing NASCET criteria, using the distal internal carotid diameter as the denominator. RADIATION DOSE REDUCTION: This exam was performed according to the departmental dose-optimization program  which includes automated exposure control, adjustment of the mA and/or kV according to patient size and/or use of iterative reconstruction technique. CONTRAST:  75mL OMNIPAQUE IOHEXOL 350 MG/ML SOLN COMPARISON:  None Available. FINDINGS: CT HEAD FINDINGS Brain: No mass, hemorrhage or extra-axial collection. Old right cerebellar infarct. Vascular: No hyperdense vessel or unexpected vascular calcification. Skull: The visualized skull base, calvarium and extracranial soft tissues are normal. Sinuses/Orbits: No fluid levels or advanced mucosal thickening of the visualized paranasal sinuses. No mastoid or middle ear effusion. Normal orbits. CTA NECK FINDINGS Skeleton: No acute abnormality or high grade bony spinal canal stenosis. Other neck: Normal pharynx, larynx and major salivary glands. No cervical lymphadenopathy. Unremarkable thyroid  gland. Upper chest: No pneumothorax or pleural effusion. No nodules or masses. Aortic arch: There is no calcific atherosclerosis of the aortic arch. Conventional 3 vessel aortic branching pattern. RIGHT carotid system: Normal without aneurysm, dissection or stenosis. LEFT carotid system: Normal without aneurysm, dissection or stenosis. Vertebral arteries: Codominant configuration. There is no dissection, occlusion or flow-limiting stenosis to the skull base (V1-V3 segments). CTA HEAD FINDINGS POSTERIOR CIRCULATION: Vertebral arteries are normal. No proximal occlusion of the anterior or inferior cerebellar arteries. Basilar artery is normal. Superior cerebellar arteries are normal. Posterior cerebral arteries are normal. ANTERIOR CIRCULATION: Intracranial internal carotid arteries are normal. Anterior cerebral arteries are normal. Middle cerebral arteries are normal. Venous sinuses: As permitted by contrast timing, patent. Anatomic variants: Fetal origins of both posterior cerebral arteries. Review of the MIP images confirms the above findings. IMPRESSION: 1. No emergent large vessel  occlusion or high-grade stenosis of the intracranial arteries. 2. Old right cerebellar infarct. Electronically Signed   By: Juanetta Nordmann M.D.   On: 11/10/2023 19:46   MR BRAIN WO CONTRAST Result Date: 11/10/2023 CLINICAL DATA:  Acute neurologic deficit EXAM: MRI HEAD WITHOUT CONTRAST TECHNIQUE: Multiplanar, multiecho pulse sequences of the brain and surrounding structures were obtained without intravenous contrast. COMPARISON:  None Available. FINDINGS: Brain: Small focus of acute ischemia within the superior left cerebellar vermis. Old right cerebellar infarct. No acute or chronic hemorrhage. Minimal multifocal hyperintense T2-weight signal within the white matter. The midline structures are normal. Vascular: Normal flow voids. Skull and upper cervical spine: Normal calvarium and skull base. Visualized upper cervical spine and soft tissues are normal. Sinuses/Orbits:No paranasal sinus fluid levels or advanced mucosal thickening. No mastoid or middle ear effusion. Normal orbits. IMPRESSION: 1. Small focus of acute ischemia within the superior left cerebellar vermis. No hemorrhage or mass effect. 2. Old right cerebellar infarct. Electronically Signed   By: Juanetta Nordmann M.D.   On: 11/10/2023 19:24    EKG: Independently reviewed. See above  Assessment/Plan Acute  small left cerebellar stroke -patient p/w vertigo -CTH, CTA head and neck unremarkable for acute finding -MRI brain + cva as noted above -A1c/HLD pending -Permissive HTN x48 hours from sx onset -goal BP < 220/110, PRN above these parameters - per neuro ASA 81mg  daily + plavix 75mg  daily x21 days f/b ASA 81mg  daily monotherapy after that -neuro checks , SLP, PT/OT /Echo -await final neuro recs   Hypothyroidism  - non compliant with synthroid  -states stop taking medication because of itching -she noted she had not followed up with her primary to discuss since stopping her medication  -will check tsh    DVT prophylaxis: heparin Code  Status: full/ as discussed per patient wishes in event of cardiac arrest  Family Communication: none at beside Disposition Plan: patient  expected to be admitted greater than 2 midnights  Consults called: Neurology  Admission status: progressive care    Sabas Cradle MD [facility]   If 7PM-7AM, please contact night-coverage [email or web address] Password [facility]  11/10/2023, 10:19 PM

## 2023-11-10 NOTE — ED Provider Notes (Signed)
 Physical Exam  BP (!) 176/92 (BP Location: Left Arm)   Pulse 73   Temp 98 F (36.7 C) (Oral)   Resp 14   Ht 5\' 6"  (1.676 m)   Wt 108.9 kg   SpO2 100%   BMI 38.74 kg/m   Physical Exam Vitals and nursing note reviewed.  Constitutional:      General: She is not in acute distress.    Appearance: She is not toxic-appearing.  Eyes:     General: No scleral icterus. Cardiovascular:     Rate and Rhythm: Normal rate.  Pulmonary:     Effort: Pulmonary effort is normal. No respiratory distress.  Skin:    General: Skin is warm and dry.  Neurological:     Mental Status: She is alert.     Procedures  Procedures  ED Course / MDM   Clinical Course as of 11/10/23 2139  Mon Nov 10, 2023  2111 Admitted to hospitalist [RR]    Clinical Course User Index [RR] Spence Dux, PA-C   Medical Decision Making Amount and/or Complexity of Data Reviewed Labs: ordered. Radiology: ordered.  Risk OTC drugs. Prescription drug management. Decision regarding hospitalization.   Please see previous provider note for additional information. The patient was sent over from [facility] as they did not have a functioning CT machine. She will also be needing MRI  On evaluation, the patient reports that if she lays still, the dizziness continues to improved.   CT cervical No acute fracture or static subluxation of the cervical spine. Per radiologist's interpretation.    CTA head and neck shows 1. No emergent large vessel occlusion or high-grade stenosis of the intracranial arteries. 2. Old right cerebellar infarct.   MRI brain shows 1. Small focus of acute ischemia within the superior left cerebellar vermis. No hemorrhage or mass effect. 2. Old right cerebellar infarct.   Relayed results to family member and patient in the room.  They were unaware of previous right infarct as they deny any previous stroke history.  I called Dr. Renaee Caro with neurology who asked to get the patient in clopidogrel and  aspirin.  Patient does not take these daily already.  Will give her at this dose.  He will see the patient in consultation with hospitalist to admit.  Hospitalist to admit the patient.  Results for orders placed or performed during the hospital encounter of 11/10/23  Lipase, blood   Collection Time: 11/10/23  2:47 PM  Result Value Ref Range   Lipase 23 11 - 51 U/L  Comprehensive metabolic panel   Collection Time: 11/10/23  2:47 PM  Result Value Ref Range   Sodium 137 135 - 145 mmol/L   Potassium 4.2 3.5 - 5.1 mmol/L   Chloride 102 98 - 111 mmol/L   CO2 20 (L) 22 - 32 mmol/L   Glucose, Bld 177 (H) 70 - 99 mg/dL   BUN 15 8 - 23 mg/dL   Creatinine, Ser 9.62 0.44 - 1.00 mg/dL   Calcium 95.2 8.9 - 84.1 mg/dL   Total Protein 8.7 (H) 6.5 - 8.1 g/dL   Albumin 4.3 3.5 - 5.0 g/dL   AST 27 15 - 41 U/L   ALT 15 0 - 44 U/L   Alkaline Phosphatase 104 38 - 126 U/L   Total Bilirubin 0.6 0.0 - 1.2 mg/dL   GFR, Estimated >32 >44 mL/min   Anion gap 16 (H) 5 - 15  CBC   Collection Time: 11/10/23  2:47 PM  Result Value  Ref Range   WBC 11.4 (H) 4.0 - 10.5 K/uL   RBC 5.15 (H) 3.87 - 5.11 MIL/uL   Hemoglobin 14.5 12.0 - 15.0 g/dL   HCT 16.1 09.6 - 04.5 %   MCV 79.6 (L) 80.0 - 100.0 fL   MCH 28.2 26.0 - 34.0 pg   MCHC 35.4 30.0 - 36.0 g/dL   RDW 40.9 81.1 - 91.4 %   Platelets 229 150 - 400 K/uL   nRBC 0.0 0.0 - 0.2 %   CT C-SPINE NO CHARGE Result Date: 11/10/2023 CLINICAL DATA:  Vertigo and left leg pain EXAM: CT CERVICAL SPINE WITHOUT CONTRAST TECHNIQUE: Multidetector CT imaging of the cervical spine was performed without intravenous contrast. Multiplanar CT image reconstructions were also generated. RADIATION DOSE REDUCTION: This exam was performed according to the departmental dose-optimization program which includes automated exposure control, adjustment of the mA and/or kV according to patient size and/or use of iterative reconstruction technique. COMPARISON:  None Available. FINDINGS:  Alignment: No static subluxation. Facets are aligned. Occipital condyles and the lateral masses of C1 and C2 are normally approximated. Skull base and vertebrae: No acute fracture. Soft tissues and spinal canal: No prevertebral fluid or swelling. No visible canal hematoma. Disc levels: No advanced spinal canal or neural foraminal stenosis. Upper chest: No pneumothorax, pulmonary nodule or pleural effusion. Other: Normal visualized paraspinal cervical soft tissues. IMPRESSION: No acute fracture or static subluxation of the cervical spine. Electronically Signed   By: Juanetta Nordmann M.D.   On: 11/10/2023 19:53   CT ANGIO HEAD NECK W WO CM Result Date: 11/10/2023 CLINICAL DATA:  Vertigo and fall EXAM: CT ANGIOGRAPHY HEAD AND NECK WITH AND WITHOUT CONTRAST TECHNIQUE: Multidetector CT imaging of the head and neck was performed using the standard protocol during bolus administration of intravenous contrast. Multiplanar CT image reconstructions and MIPs were obtained to evaluate the vascular anatomy. Carotid stenosis measurements (when applicable) are obtained utilizing NASCET criteria, using the distal internal carotid diameter as the denominator. RADIATION DOSE REDUCTION: This exam was performed according to the departmental dose-optimization program which includes automated exposure control, adjustment of the mA and/or kV according to patient size and/or use of iterative reconstruction technique. CONTRAST:  75mL OMNIPAQUE IOHEXOL 350 MG/ML SOLN COMPARISON:  None Available. FINDINGS: CT HEAD FINDINGS Brain: No mass, hemorrhage or extra-axial collection. Old right cerebellar infarct. Vascular: No hyperdense vessel or unexpected vascular calcification. Skull: The visualized skull base, calvarium and extracranial soft tissues are normal. Sinuses/Orbits: No fluid levels or advanced mucosal thickening of the visualized paranasal sinuses. No mastoid or middle ear effusion. Normal orbits. CTA NECK FINDINGS Skeleton: No acute  abnormality or high grade bony spinal canal stenosis. Other neck: Normal pharynx, larynx and major salivary glands. No cervical lymphadenopathy. Unremarkable thyroid  gland. Upper chest: No pneumothorax or pleural effusion. No nodules or masses. Aortic arch: There is no calcific atherosclerosis of the aortic arch. Conventional 3 vessel aortic branching pattern. RIGHT carotid system: Normal without aneurysm, dissection or stenosis. LEFT carotid system: Normal without aneurysm, dissection or stenosis. Vertebral arteries: Codominant configuration. There is no dissection, occlusion or flow-limiting stenosis to the skull base (V1-V3 segments). CTA HEAD FINDINGS POSTERIOR CIRCULATION: Vertebral arteries are normal. No proximal occlusion of the anterior or inferior cerebellar arteries. Basilar artery is normal. Superior cerebellar arteries are normal. Posterior cerebral arteries are normal. ANTERIOR CIRCULATION: Intracranial internal carotid arteries are normal. Anterior cerebral arteries are normal. Middle cerebral arteries are normal. Venous sinuses: As permitted by contrast timing, patent. Anatomic variants: Fetal origins  of both posterior cerebral arteries. Review of the MIP images confirms the above findings. IMPRESSION: 1. No emergent large vessel occlusion or high-grade stenosis of the intracranial arteries. 2. Old right cerebellar infarct. Electronically Signed   By: Juanetta Nordmann M.D.   On: 11/10/2023 19:46   MR BRAIN WO CONTRAST Result Date: 11/10/2023 CLINICAL DATA:  Acute neurologic deficit EXAM: MRI HEAD WITHOUT CONTRAST TECHNIQUE: Multiplanar, multiecho pulse sequences of the brain and surrounding structures were obtained without intravenous contrast. COMPARISON:  None Available. FINDINGS: Brain: Small focus of acute ischemia within the superior left cerebellar vermis. Old right cerebellar infarct. No acute or chronic hemorrhage. Minimal multifocal hyperintense T2-weight signal within the white matter. The  midline structures are normal. Vascular: Normal flow voids. Skull and upper cervical spine: Normal calvarium and skull base. Visualized upper cervical spine and soft tissues are normal. Sinuses/Orbits:No paranasal sinus fluid levels or advanced mucosal thickening. No mastoid or middle ear effusion. Normal orbits. IMPRESSION: 1. Small focus of acute ischemia within the superior left cerebellar vermis. No hemorrhage or mass effect. 2. Old right cerebellar infarct. Electronically Signed   By: Juanetta Nordmann M.D.   On: 11/10/2023 19:24       Spence Dux, PA-C 11/10/23 2143    Rolinda Climes, DO 11/10/23 2208

## 2023-11-10 NOTE — ED Triage Notes (Signed)
 Pt to ER with c/o left leg pain that started yesterday.  States this AM woke with "symptoms of vertigo" - spinning.  States approximately 1 hour ago started with vomiting.  Pt with active vomiting on arrival to ER.

## 2023-11-10 NOTE — Consult Note (Signed)
 NEUROLOGY CONSULT NOTE   Date of service: Nov 10, 2023 Patient Name: Josiane Labine MRN:  [id number] DOB:  09/28/1950 Chief Complaint: "Dizziness and leaning to the left" Requesting Provider: Rolinda Climes, DO  History of Present Illness  Keyly Baldonado is a 73 y.o. female with hx of thyroid  disease and carpal tunnel syndrome who presented to the ED yesterday with complaints of vertigo and gait unsteadiness that were noted on awakening Monday. She experienced N/V and leaning to the left. She was actively vomiting on arrival to the ED. Her symptoms have improved since then. 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 She denies any other associated symptoms including no headache, vision loss, facial droop, confusion, aphasia, tingling, numbness or limb weakness. She takes ASA at home, but only intermittently.     ROS  Comprehensive ROS performed and pertinent positives documented in HPI    Past History   Past Medical History:  Diagnosis Date   Rash 03/12/2021   Thyroid  disease     Past Surgical History:  Procedure Laterality Date   CARPAL TUNNEL RELEASE      Family History: Family History  Problem Relation Age of Onset   Hypertension Mother    Heart disease Mother    Diabetes Mother    Diabetes Sister    Diabetes Brother    Diabetes Other    Schizophrenia Grandson    Breast cancer Neg Hx     Social History  reports that she has never smoked. She has never used smokeless tobacco. She reports that she does not drink alcohol and does not use drugs.  No Known Allergies  Medications   Current Facility-Administered Medications:    aspirin tablet 325 mg, 325 mg, Oral, Daily, Spence Dux, PA-C   clopidogrel (PLAVIX) tablet 75 mg, 75 mg, Oral, Once, Ransom, Riley, PA-C   ondansetron  (ZOFRAN ) 4 MG/2ML injection, , , ,   Current Outpatient Medications:    diazepam  (VALIUM ) 5 MG tablet, Take 1 tablet (5 mg total) by mouth every 8 (eight) hours as needed for  muscle spasms., Disp: 10 tablet, Rfl: 0   HYDROcodone -acetaminophen  (NORCO/VICODIN) 5-325 MG tablet, Take 1 tablet by mouth every 4 (four) hours as needed., Disp: 10 tablet, Rfl: 0   hydrOXYzine  (VISTARIL ) 25 MG capsule, Take 1 capsule (25 mg total) by mouth every 8 (eight) hours as needed. For itching. Can make you sleepy., Disp: 15 capsule, Rfl: 0   ibuprofen  (ADVIL ,MOTRIN ) 800 MG tablet, , Disp: , Rfl:    latanoprost  (XALATAN ) 0.005 % ophthalmic solution, Place 1 drop into affected eye(s) every evening., Disp: 7.5 mL, Rfl: 11   levothyroxine  (SYNTHROID ) 75 MCG tablet, Take 1 tablet (75 mcg total) by mouth daily., Disp: 90 tablet, Rfl: 1   Semaglutide , 1 MG/DOSE, 4 MG/3ML SOPN, Inject 1 mg as directed once a week., Disp: 3 mL, Rfl: 0   sodium chloride  (MURO 128) 5 % ophthalmic solution, Place 1 drop into both eyes 2 (two) times daily., Disp: 15 mL, Rfl: 5  Vitals   Vitals:   11/10/23 1530 11/10/23 1600 11/10/23 1744 11/10/23 1830  BP: (!) 142/87 130/87 (!) 176/92 (!) 166/95  Pulse: 85 87 73 68  Resp: 15 14 14 13   Temp: 98 F (36.7 C)  98 F (36.7 C)   TempSrc: Oral  Oral   SpO2: 93% 95% 100% 100%  Weight:      Height:        Body mass index is 38.74 kg/m.  Physical  Exam   Constitutional: Appears well-developed and well-nourished.  Psych: Affect appropriate to situation.  Eyes: No scleral injection.  HENT: No OP obstruction.  Head: Normocephalic.  Respiratory: Effort normal, non-labored breathing.    Neurologic Examination   Neurological Examination Mental Status: Alert, oriented x 5, thought content appropriate.  Speech fluent without evidence of aphasia.  Able to follow all commands without difficulty. Cranial Nerves: II: Temporal visual fields intact with no extinction to DSS. PERRL  III,IV, VI: No ptosis. EOMI. No nystagmus.  V: Temp sensation equal bilaterally  VII: Smile symmetric VIII: Hearing intact to voice IX,X: No hoarseness XI: Symmetric shoulder  shrug XII: Midline tongue extension Motor: BUE 5/5 proximally and distally BLE 5/5 proximally and distally  No pronator drift.  Sensory: Light touch intact throughout, bilaterally. No extinction to DSS.  Deep Tendon Reflexes: 2+ and symmetric throughout Cerebellar: No ataxia with FNF or H-S bilaterally  Gait: Deferred  Labs/Imaging/Neurodiagnostic studies   CBC:  Recent Labs  Lab December 06, 2023 1447  WBC 11.4*  HGB 14.5  HCT 41.0  MCV 79.6*  PLT 229   Basic Metabolic Panel:  Lab Results  Component Value Date   NA 137 Dec 06, 2023   K 4.2 12/06/2023   CO2 20 (L) 12-06-2023   GLUCOSE 177 (H) Dec 06, 2023   BUN 15 December 06, 2023   CREATININE 0.95 12-06-2023   CALCIUM 10.2 12/06/2023   GFRNONAA >60 Dec 06, 2023   Lipid Panel:  Lab Results  Component Value Date   LDLCALC 195 (H) 04/09/2022   HgbA1c:  Lab Results  Component Value Date   HGBA1C 6.1 (H) 03/24/2023     MRI Brain (Personally reviewed): 1. Small focus of acute ischemia within the superior left cerebellar vermis. No hemorrhage or mass effect. 2. Old right cerebellar infarct.    ASSESSMENT  Ashia Dehner is a 73 y.o. female presenting with acute onset of vertigo and leaning to the left. MRI brain reveals a small acute stroke within the superior left cerebellar vermis.  - Exam reveals no focal neurological deficits.  - MRI brain as above.  - Stroke risk factors: Obesity and age.   - Impression: Acute left cerebellar vermis ischemic stroke.   RECOMMENDATIONS  1. HgbA1c, fasting lipid panel 2. CTA of head and neck 3. PT consult, OT consult, Speech consult 4. Echocardiogram 5. Statin 6. DAPT with ASA and Plavix x 21 days, followed by ASA monotherapy over the long term.   7. Risk factor modification 8. Telemetry monitoring 9. Frequent neuro checks 10. NPO until passes stroke swallow screen 11. BP management per standard protocol. Out of the permissive HTN time window.    ______________________________________________________________________    Hope Ly, Theia Dezeeuw, MD [facility]

## 2023-11-10 NOTE — ED Provider Notes (Signed)
 [place] [facility] DEPARTMENT AT [facility] Provider Note   CSN: [id number] Arrival date & time: 11/10/23  1433     History  Chief Complaint  Patient presents with   Dizziness   Leg Pain   Emesis    Dawn Carlson is a 73 y.o. female.   Dizziness Associated symptoms: vomiting   Leg Pain Emesis   73 year old female presents emergency department complaints of dizziness.  Patient reports room spinning dizziness began around 7 AM this morning.  States she woke up around 5 AM and felt at baseline.  Was leaving the restroom around 7 AM when her symptoms began.  Denies history of similar symptoms in the past.  States that the symptoms of room spinning dizziness has been constant since onset.  Worsened with certain positional changes.  Has reports several episodes of emesis since symptom onset.  States that it is difficult to focus on an object due to spinning but denies any double vision.  Denies any difficulty swallowing/speaking, weakness/sensory deficits in upper extremities.  Patient does feel like she is walking to the left.  Patient did have a fall earlier today around 10 AM.  States that she attempted to walk to her bed when she fell into a nearby suitcase.  States that she hit the side of her head on the suitcase.  Denies LOC, blood thinner use.  Denies any chest pain, shortness of breath, abdominal pain, neck/back pain, upper or lower extremity pain from the fall.  Past medical history significant for thyroid  disease  Home Medications Prior to Admission medications   Medication Sig Start Date End Date Taking? Authorizing Provider  diazepam  (VALIUM ) 5 MG tablet Take 1 tablet (5 mg total) by mouth every 8 (eight) hours as needed for muscle spasms. 05/22/22   Darlis Eisenmenger, PA-C  HYDROcodone -acetaminophen  (NORCO/VICODIN) 5-325 MG tablet Take 1 tablet by mouth every 4 (four) hours as needed. 05/22/22   Darlis Eisenmenger, PA-C  hydrOXYzine  (VISTARIL ) 25 MG capsule Take 1  capsule (25 mg total) by mouth every 8 (eight) hours as needed. For itching. Can make you sleepy. 03/28/22   Early, Sara E, NP  ibuprofen  (ADVIL ,MOTRIN ) 800 MG tablet  08/04/17   [provider]  latanoprost  (XALATAN ) 0.005 % ophthalmic solution Place 1 drop into affected eye(s) every evening. 09/24/23     levothyroxine  (SYNTHROID ) 75 MCG tablet Take 1 tablet (75 mcg total) by mouth daily. 12/04/22   de [place], Alonza Jansky, MD  Semaglutide , 1 MG/DOSE, 4 MG/3ML SOPN Inject 1 mg as directed once a week. 11/14/21   [place], Alonza Jansky, MD  sodium chloride  (MURO 128) 5 % ophthalmic solution Place 1 drop into both eyes 2 (two) times daily. 04/23/23         Allergies    Patient has no known allergies.    Review of Systems   Review of Systems  Gastrointestinal:  Positive for vomiting.  Neurological:  Positive for dizziness.  All other systems reviewed and are negative.   Physical Exam Updated Vital Signs BP (!) 153/106   Pulse 87   Temp (!) 97.5 F (36.4 C)   Resp 18   Ht 5\' 6"  (1.676 m)   Wt 108.9 kg   SpO2 98%   BMI 38.74 kg/m  Physical Exam Vitals and nursing note reviewed.  Constitutional:      General: She is not in acute distress.    Appearance: She is well-developed.  HENT:     Head: Normocephalic and  atraumatic.  Eyes:     Conjunctiva/sclera: Conjunctivae normal.  Cardiovascular:     Rate and Rhythm: Normal rate and regular rhythm.     Heart sounds: No murmur heard. Pulmonary:     Effort: Pulmonary effort is normal. No respiratory distress.     Breath sounds: Normal breath sounds.  Abdominal:     Palpations: Abdomen is soft.     Tenderness: There is no abdominal tenderness.  Musculoskeletal:     Cervical back: Neck supple.     Comments: No midline tenderness cervical, thoracic and lumbar spine without step-off deformity.  No chest wall tenderness.  No tenderness upper or lower extremities.  Skin:    General: Skin is warm and dry.     Capillary Refill: Capillary  refill takes less than 2 seconds.  Neurological:     Mental Status: She is alert.     Comments: Alert and oriented to self, place, time and event.   Speech is fluent, clear without dysarthria or dysphasia.   Strength symmetric in upper/lower extremities   Sensation intact in upper/lower extremities   Patient unable to walk in the room.  Was able to sit up and hang her legs over the side of the bed but felt worsening room spinning sensation.  Was unable to open eyes due to severity of symptoms.  Patient felt unsafe to walk. CN I not tested  CN II not tested CN III, IV, VI PERRLA and EOMs intact bilaterally  CN V Intact sensation to sharp and light touch to the face  CN VII facial movements symmetric  CN VIII not tested  CN IX, X no uvula deviation, symmetric rise of soft palate  CN XI symmetric SCM and trapezius strength bilaterally  CN XII Midline tongue protrusion, symmetric L/R movements     Psychiatric:        Mood and Affect: Mood normal.     ED Results / Procedures / Treatments   Labs (all labs ordered are listed, but only abnormal results are displayed) Labs Reviewed  CBC - Abnormal; Notable for the following components:      Result Value   WBC 11.4 (*)    RBC 5.15 (*)    MCV 79.6 (*)    All other components within normal limits  LIPASE, BLOOD  COMPREHENSIVE METABOLIC PANEL WITH GFR  URINALYSIS, ROUTINE W REFLEX MICROSCOPIC    EKG None  Radiology No results found.  Procedures Procedures    Medications Ordered in ED Medications  diazepam  (VALIUM ) injection 5 mg (has no administration in time range)  meclizine  (ANTIVERT ) tablet 25 mg (has no administration in time range)  sodium chloride  0.9 % bolus 500 mL (has no administration in time range)    ED Course/ Medical Decision Making/ A&P                                 Medical Decision Making Amount and/or Complexity of Data Reviewed Labs: ordered. Radiology: ordered.  Risk Prescription drug  management.   This patient presents to the ED for concern of dizziness, this involves an extensive number of treatment options, and is a complaint that carries with it a high risk of complications and morbidity.  The differential diagnosis includes CVA, hypertension, hyperlipidemia, Meniere's disease, vertebrobasilar insufficiency, medication side effect, other   Co morbidities that complicate the patient evaluation  See HPI   Additional history obtained:  Additional history obtained from EMR  External records from outside source obtained and reviewed including hospital records   Lab Tests:  I Ordered, and personally interpreted labs.  The pertinent results include: Leukocytosis 11.4.  No evidence of anemia.  Lites within normal range.  Mild decrease in bicarb of 20 otherwise, electrolytes within normal limits.  No transaminitis.  No renal dysfunction.  Lipase within normal limits.   Imaging Studies ordered:  I ordered imaging studies including CT angio head and neck, CT cervical spine, MR brain which are pending   Cardiac Monitoring: / EKG:  The patient was maintained on a cardiac monitor.  I personally viewed and interpreted the cardiac monitored which showed an underlying rhythm of: Sinus rhythm   Consultations Obtained:  N/a   Problem List / ED Course / Critical interventions / Medication management  Dizziness I ordered medication including Valium , normal saline, meclizine     Reevaluation of the patient after these medicines showed that the patient improved I have reviewed the patients home medicines and have made adjustments as needed   Social Determinants of Health:  Denies tobacco, illicit drug use.   Test / Admission - Considered:  Dizziness Vitals signs significant for hypertension blood pressure 142/87. Otherwise within normal range and stable throughout visit. Laboratory/imaging studies significant for: See above 73 year old female presents emergency  department complaints of dizziness.  Patient reports room spinning dizziness began around 7 AM this morning.  States she woke up around 5 AM and felt at baseline.  Was leaving the restroom around 7 AM when her symptoms began.  Denies history of similar symptoms in the past.  States that the symptoms of room spinning dizziness has been constant since onset.  Worsened with certain positional changes.  Has reports several episodes of emesis since symptom onset.  States that it is difficult to focus on an object due to spinning but denies any double vision.  Denies any difficulty swallowing/speaking, weakness/sensory deficits in upper extremities.  Patient does feel like she is walking to the left.  Patient did have a fall earlier today around 10 AM.  States that she attempted to walk to her bed when she fell into a nearby suitcase.  States that she hit the side of her head on the suitcase.  Denies LOC, blood thinner use.  Denies any chest pain, shortness of breath, abdominal pain, neck/back pain, upper or lower extremity pain from the fall. On exam initially, patient with her eyes closed laying back in the bed.  Whenever she opened her eyes, consistently rightward beating horizontal nystagmus that was not fatigable.  Unable to stand given severity of symptoms patient was experiencing.  Trial of Valium , meclizine  with some improvement of symptoms.  Patient able to stand and take 1-2 steps and then abruptly felt worsening of dizzy sensation and felt like she was moving to the left.  Was lowered back down to the bed.  Given continued symptoms as well as some features of presentation concerning for central process, it was felt clinically the patient warranted advanced neuroimaging.  Our CT machine currently down.  Will order a CT angio head and neck as well as MRI brain.  Will transfer to [facility] to have imaging done.  Treatment plan discussed with patient and she acknowledged understanding was agreeable.  Patient  stable upon transfer.         Final Clinical Impression(s) / ED Diagnoses Final diagnoses:  None    Rx / DC Orders ED Discharge Orders     None  Paxton Butter, [place] 11/10/23 1639    Carin Charleston, MD 11/10/23 [phone]

## 2023-11-10 NOTE — ED Notes (Signed)
 Called Kim at [facility] for transport

## 2023-11-11 ENCOUNTER — Encounter (HOSPITAL_BASED_OUTPATIENT_CLINIC_OR_DEPARTMENT_OTHER): Payer: PRIVATE HEALTH INSURANCE

## 2023-11-11 ENCOUNTER — Inpatient Hospital Stay (HOSPITAL_BASED_OUTPATIENT_CLINIC_OR_DEPARTMENT_OTHER)

## 2023-11-11 ENCOUNTER — Other Ambulatory Visit (HOSPITAL_COMMUNITY): Payer: Self-pay

## 2023-11-11 DIAGNOSIS — I10 Essential (primary) hypertension: Secondary | ICD-10-CM

## 2023-11-11 DIAGNOSIS — I739 Peripheral vascular disease, unspecified: Secondary | ICD-10-CM | POA: Diagnosis not present

## 2023-11-11 DIAGNOSIS — R7303 Prediabetes: Secondary | ICD-10-CM

## 2023-11-11 DIAGNOSIS — Z6838 Body mass index (BMI) 38.0-38.9, adult: Secondary | ICD-10-CM

## 2023-11-11 DIAGNOSIS — R29700 NIHSS score 0: Secondary | ICD-10-CM | POA: Diagnosis not present

## 2023-11-11 DIAGNOSIS — E039 Hypothyroidism, unspecified: Secondary | ICD-10-CM | POA: Diagnosis not present

## 2023-11-11 DIAGNOSIS — E785 Hyperlipidemia, unspecified: Secondary | ICD-10-CM | POA: Diagnosis present

## 2023-11-11 DIAGNOSIS — Z6837 Body mass index (BMI) 37.0-37.9, adult: Secondary | ICD-10-CM

## 2023-11-11 DIAGNOSIS — I639 Cerebral infarction, unspecified: Secondary | ICD-10-CM | POA: Diagnosis not present

## 2023-11-11 DIAGNOSIS — E669 Obesity, unspecified: Secondary | ICD-10-CM

## 2023-11-11 DIAGNOSIS — E66812 Obesity, class 2: Secondary | ICD-10-CM | POA: Diagnosis not present

## 2023-11-11 DIAGNOSIS — I6389 Other cerebral infarction: Secondary | ICD-10-CM

## 2023-11-11 DIAGNOSIS — I63342 Cerebral infarction due to thrombosis of left cerebellar artery: Secondary | ICD-10-CM | POA: Diagnosis not present

## 2023-11-11 LAB — CBG MONITORING, ED
Glucose-Capillary: 103 mg/dL — ABNORMAL HIGH (ref 70–99)
Glucose-Capillary: 108 mg/dL — ABNORMAL HIGH (ref 70–99)
Glucose-Capillary: 147 mg/dL — ABNORMAL HIGH (ref 70–99)

## 2023-11-11 LAB — ECHOCARDIOGRAM COMPLETE
Area-P 1/2: 3.17 cm2
Calc EF: 63.4 %
Height: 66 in
S' Lateral: 2.3 cm
Single Plane A2C EF: 64.3 %
Single Plane A4C EF: 62.5 %
Weight: 3840 [oz_av]

## 2023-11-11 LAB — HEMOGLOBIN A1C
Hgb A1c MFr Bld: 5.7 % — ABNORMAL HIGH (ref 4.8–5.6)
Mean Plasma Glucose: 116.89 mg/dL

## 2023-11-11 LAB — TSH: TSH: 2.244 u[IU]/mL (ref 0.350–4.500)

## 2023-11-11 LAB — HIV ANTIBODY (ROUTINE TESTING W REFLEX): HIV Screen 4th Generation wRfx: NONREACTIVE

## 2023-11-11 MED ORDER — INSULIN ASPART 100 UNIT/ML IJ SOLN: 0.0000 [IU] | Freq: Every day | INTRAMUSCULAR | Status: DC

## 2023-11-11 MED ORDER — ROSUVASTATIN CALCIUM 40 MG PO TABS
40.0000 mg | ORAL_TABLET | Freq: Every day | ORAL | 0 refills | Status: DC
  Filled 2023-11-11: qty 90, 90d supply, fill #0

## 2023-11-11 MED ORDER — CLOPIDOGREL BISULFATE 75 MG PO TABS
75.0000 mg | ORAL_TABLET | Freq: Every day | ORAL | 0 refills | Status: AC
  Filled 2023-11-11: qty 21, 21d supply, fill #0

## 2023-11-11 MED ORDER — ASPIRIN 81 MG PO CHEW: 81.0000 mg | CHEWABLE_TABLET | Freq: Every day | ORAL | Status: AC

## 2023-11-11 MED ORDER — ROSUVASTATIN CALCIUM 20 MG PO TABS: 40.0000 mg | ORAL_TABLET | Freq: Every day | ORAL | Status: DC

## 2023-11-11 MED ORDER — SENNOSIDES-DOCUSATE SODIUM 8.6-50 MG PO TABS: 1.0000 | ORAL_TABLET | Freq: Two times a day (BID) | ORAL | Status: DC | PRN

## 2023-11-11 MED ORDER — INSULIN ASPART 100 UNIT/ML IJ SOLN
0.0000 [IU] | Freq: Three times a day (TID) | INTRAMUSCULAR | Status: DC
  Administered 2023-11-11: 1 [IU] via SUBCUTANEOUS

## 2023-11-11 NOTE — ED Notes (Signed)
 Pt's daughter arrived, pt awake alert,not in any acute distress, vital signs stable.

## 2023-11-11 NOTE — ED Notes (Signed)
 PT at bedside.

## 2023-11-11 NOTE — Care Management CC44 (Signed)
 Condition Code 44 Documentation Completed  Patient Details  Name: Dawn Carlson MRN: [id number] Date of Birth: 1951/04/20   Condition Code 44 given:  Yes Patient signature on Condition Code 44 notice:  Yes Documentation of 2 MD's agreement:  Yes Code 44 added to claim:  Yes    Joanette Moynahan, RN 11/11/2023, 3:35 PM

## 2023-11-11 NOTE — ED Notes (Signed)
 Echo at bedside

## 2023-11-11 NOTE — ED Notes (Signed)
Breakfast tray at bedside 

## 2023-11-11 NOTE — Discharge Summary (Signed)
**Note Dawn-Identified via Obfuscation**  Physician Discharge Summary  Dawn Carlson XBM:[id number] DOB: Dec 27, 1950 DOA: 11/10/2023  PCP: Dawn Carlson, Dawn J, MD  Admit date: 11/10/2023 Discharge date: 11/11/23  Admitted From: Home Disposition: Home Recommendations for Outpatient Follow-up:  Outpatient follow-up with PCP and neurology as below Check CMP and CBC at follow-up Please follow up on the following pending results: Lipid panel  Home Health: Ambulatory referral to physical therapy ordered Equipment/Devices: None  Discharge Condition: Stable CODE STATUS: Full code  Follow-up Information     [facility]. Schedule an appointment as soon as possible for a visit in 1 month(s).   Specialty: Neurology Why: stroke clinic Contact information: [address] [place]  [address] [phone]        Dawn Carlson, Dawn Jansky, MD. Schedule an appointment as soon as possible for a visit in 1 week(s).   Specialty: Family Medicine Contact information: [address] [place] [address] [phone]                 Hospital course 73 year old F with PMH of hypothyroidism, class II obesity and prediabetes presented to [facility] with vertigo, nausea and vomiting, and found to have small focus of acute ischemia within the superior left cerebral vermis and old right cerebellar infarct on MRI.  CT head and CT angio head and neck were unremarkable other than old right cerebral infarct.  Blood pressure slightly elevated.  Mild leukocytosis to 11.4.  Neurology consulted.  Started on aspirin and Plavix.  She was transferred to [facility] for further evaluation.  The next day, symptoms basically resolved.  Neuro exam and echocardiogram without significant finding.  A1c 5.7%.  Lipid panel was pending at time of discharge.  Her previous lipid panel was close to 200.  She was cleared for discharge on Plavix and aspirin for 3 weeks followed by aspirin alone.  She was  also started on Crestor.  Outpatient follow-up with neurology as above.  Evaluated by therapy and they recommended outpatient physical therapy.  Referral placed.   See individual problem list below for more.   Problems addressed during this hospitalization Principal Problem:   Acute CVA (cerebrovascular accident) [facility]) Active Problems:   Hypothyroidism   Class 2 severe obesity due to excess calories with serious comorbidity and body mass index (BMI) of 37.0 to 37.9 in adult [facility])   Pre-diabetes   Cerebellar stroke, acute (HCC)   Hyperlipidemia              Time spent 35 minutes  Vital signs Vitals:   11/11/23 1015 11/11/23 1200 11/11/23 1244 11/11/23 1700  BP:  138/89  136/77  Pulse: 75 98  75  Temp:   97.9 F (36.6 C) 98.1 F (36.7 C)  Resp: 12 (!) 25  16  Height:      Weight:      SpO2: 100% 100%  99%  TempSrc:    Oral  BMI (Calculated):         Discharge exam  GENERAL: No apparent distress.  Nontoxic. HEENT: MMM.  Vision and hearing grossly intact.  NECK: Supple.  No apparent JVD.  RESP:  No IWOB.  Fair aeration bilaterally. CVS:  RRR. Heart sounds normal.  ABD/GI/GU: BS+. Abd soft, NTND.  MSK/EXT:  Moves extremities. No apparent deformity. No edema.  SKIN: no apparent skin lesion or wound NEURO: Awake, alert and oriented appropriately. Speech clear. Cranial nerves II-XII intact. Motor 5/5 in all muscle groups of UE and LE bilaterally,  Normal tone. Light sensation intact in all dermatomes of upper and lower ext bilaterally. Patellar reflex symmetric.  No pronator drift.  Finger to nose intact. PSYCH: Calm. Normal affect.   Discharge Instructions Discharge Instructions     Ambulatory referral to Neurology   Complete by: As directed    Follow up with stroke clinic NP at [facility] in about 4-6 weeks. Thanks.   Ambulatory referral to Physical Therapy   Complete by: As directed    Diet - low sodium heart healthy   Complete by: As directed    Discharge  instructions   Complete by: As directed    It has been a pleasure taking care of you!  You were hospitalized with vertigo due to stroke.  Your symptoms improved significantly.  You have been started on medications to reduce your risk of stroke.  Please take your medications as prescribed.  Follow-up with your primary care doctor in 1 to 2 weeks or sooner if needed.  Follow-up with neurology per their recommendation.   Take care,   Increase activity slowly   Complete by: As directed       Allergies as of 11/11/2023   No Known Allergies      Medication List     TAKE these medications    aspirin 81 MG chewable tablet Chew 1 tablet (81 mg total) by mouth daily. Start taking on: Nov 12, 2023   clopidogrel 75 MG tablet Commonly known as: PLAVIX Take 1 tablet (75 mg total) by mouth daily for 21 days. Start taking on: Nov 12, 2023   latanoprost  0.005 % ophthalmic solution Commonly known as: XALATAN  Place 1 drop into affected eye(s) every evening.   levothyroxine  75 MCG tablet Commonly known as: SYNTHROID  Take 1 tablet (75 mcg total) by mouth daily.   rosuvastatin 40 MG tablet Commonly known as: CRESTOR Take 1 tablet (40 mg total) by mouth at bedtime.   senna-docusate 8.6-50 MG tablet Commonly known as: Senokot-S Take 1 tablet by mouth 2 (two) times daily between meals as needed for mild constipation.   sodium chloride  5 % ophthalmic solution Commonly known as: Muro 128 Place 1 drop into both eyes 2 (two) times daily.        Consultations: Neurology  Procedures/Studies:   ECHOCARDIOGRAM COMPLETE Result Date: 11/11/2023    ECHOCARDIOGRAM REPORT   Patient Name:   Dawn Carlson Date of Exam: 11/11/2023 Medical Rec #:  [id number]      Height:       66.0 in Accession #:    [id number]     Weight:       240.0 lb Date of Birth:  11-Jan-1951     BSA:          2.161 m Patient Age:    73 years       BP:           151/79 mmHg Patient Gender: F              HR:           71  bpm. Exam Location:  Inpatient Procedure: 2D Echo, Cardiac Doppler, Color Doppler and Strain Analysis (Both            Spectral and Color Flow Doppler were utilized during procedure). Indications:    Stroke  History:        Patient has no prior history of Echocardiogram examinations.  Stroke; Risk Factors:Dyslipidemia.  Sonographer:    Raynelle Callow RDCS Referring Phys: [id number] [facility] A THOMAS  Sonographer Comments: Technically difficult study due to poor echo windows and patient is obese. Image acquisition challenging due to patient body habitus. IMPRESSIONS  1. Left ventricular ejection fraction, by estimation, is 65 to 70%. The left ventricle has normal function. The left ventricle has no regional wall motion abnormalities. There is moderate asymmetric left ventricular hypertrophy of the basal-septal segment. Left ventricular diastolic parameters are consistent with Grade I diastolic dysfunction (impaired relaxation).  2. Right ventricular systolic function is normal. The right ventricular size is normal. There is normal pulmonary artery systolic pressure. The estimated right ventricular systolic pressure is 23.4 mmHg.  3. The mitral valve is grossly normal. Trivial mitral valve regurgitation. No evidence of mitral stenosis.  4. The aortic valve is tricuspid. Aortic valve regurgitation is not visualized. No aortic stenosis is present.  5. The inferior vena cava is normal in size with greater than 50% respiratory variability, suggesting right atrial pressure of 3 mmHg. Comparison(s): No prior Echocardiogram. Conclusion(s)/Recommendation(s): No intracardiac source of embolism detected on this transthoracic study. Consider a transesophageal echocardiogram to exclude cardiac source of embolism if clinically indicated. FINDINGS  Left Ventricle: Left ventricular ejection fraction, by estimation, is 65 to 70%. The left ventricle has normal function. The left ventricle has no regional wall motion  abnormalities. Global longitudinal strain performed but not reported based on interpreter judgement due to suboptimal tracking. The left ventricular internal cavity size was normal in size. There is moderate asymmetric left ventricular hypertrophy of the basal-septal segment. Left ventricular diastolic parameters are consistent with Grade I diastolic dysfunction (impaired relaxation). Right Ventricle: The right ventricular size is normal. No increase in right ventricular wall thickness. Right ventricular systolic function is normal. There is normal pulmonary artery systolic pressure. The tricuspid regurgitant velocity is 2.26 m/s, and  with an assumed right atrial pressure of 3 mmHg, the estimated right ventricular systolic pressure is 23.4 mmHg. Left Atrium: Left atrial size was normal in size. Right Atrium: Right atrial size was normal in size. Pericardium: There is no evidence of pericardial effusion. Mitral Valve: The mitral valve is grossly normal. Trivial mitral valve regurgitation. No evidence of mitral valve stenosis. Tricuspid Valve: The tricuspid valve is grossly normal. Tricuspid valve regurgitation is trivial. No evidence of tricuspid stenosis. Aortic Valve: The aortic valve is tricuspid. Aortic valve regurgitation is not visualized. No aortic stenosis is present. Pulmonic Valve: The pulmonic valve was grossly normal. Pulmonic valve regurgitation is not visualized. No evidence of pulmonic stenosis. Aorta: The aortic root and ascending aorta are structurally normal, with no evidence of dilitation. Venous: The inferior vena cava is normal in size with greater than 50% respiratory variability, suggesting right atrial pressure of 3 mmHg. IAS/Shunts: The atrial septum is grossly normal.  LEFT VENTRICLE PLAX 2D LVIDd:         3.60 cm     Diastology LVIDs:         2.30 cm     LV e' medial:    8.49 cm/s LV PW:         1.00 cm     LV E/e' medial:  7.5 LV IVS:        1.40 cm     LV e' lateral:   7.99 cm/s LVOT  diam:     2.00 cm     LV E/e' lateral: 7.9 LV SV:         60  LV SV Index:   28 LVOT Area:     3.14 cm  LV Volumes (MOD) LV vol d, MOD A2C: 70.1 ml LV vol d, MOD A4C: 61.9 ml LV vol s, MOD A2C: 25.0 ml LV vol s, MOD A4C: 23.2 ml LV SV MOD A2C:     45.1 ml LV SV MOD A4C:     61.9 ml LV SV MOD BP:      42.0 ml RIGHT VENTRICLE RV S prime:     13.60 cm/s TAPSE (M-mode): 2.1 cm LEFT ATRIUM           Index        RIGHT ATRIUM           Index LA diam:      2.70 cm 1.25 cm/m   RA Area:     11.60 cm LA Vol (A2C): 17.1 ml 7.91 ml/m   RA Volume:   24.20 ml  11.20 ml/m LA Vol (A4C): 37.9 ml 17.54 ml/m  AORTIC VALVE LVOT Vmax:   102.00 cm/s LVOT Vmean:  64.500 cm/s LVOT VTI:    0.190 m  AORTA Ao Root diam: 3.10 cm Ao Asc diam:  3.30 cm MITRAL VALVE               TRICUSPID VALVE MV Area (PHT): 3.17 cm    TR Peak grad:   20.4 mmHg MV Decel Time: 239 msec    TR Vmax:        226.00 cm/s MV E velocity: 63.40 cm/s MV A velocity: 80.20 cm/s  SHUNTS MV E/A ratio:  0.79        Systemic VTI:  0.19 m                            Systemic Diam: 2.00 cm Jackquelyn Mass MD Electronically signed by Jackquelyn Mass MD Signature Date/Time: 11/11/2023/1:07:26 PM    Final    DG Chest 2 View Result Date: 11/10/2023 CLINICAL DATA:  65784 TIA (transient ischemic attack) 69629 EXAM: CHEST - 2 VIEW COMPARISON:  None Available. FINDINGS: The heart and mediastinal contours are within normal limits. Atherosclerotic plaque. No focal consolidation. No pulmonary edema. No pleural effusion. No pneumothorax. No acute osseous abnormality. IMPRESSION: 1. No active cardiopulmonary disease. 2.  Aortic Atherosclerosis (ICD10-I70.0). Electronically Signed   By: Morgane  Naveau M.D.   On: 11/10/2023 22:58   CT C-SPINE NO CHARGE Result Date: 11/10/2023 CLINICAL DATA:  Vertigo and left leg pain EXAM: CT CERVICAL SPINE WITHOUT CONTRAST TECHNIQUE: Multidetector CT imaging of the cervical spine was performed without intravenous contrast. Multiplanar CT image  reconstructions were also generated. RADIATION DOSE REDUCTION: This exam was performed according to the departmental dose-optimization program which includes automated exposure control, adjustment of the mA and/or kV according to patient size and/or use of iterative reconstruction technique. COMPARISON:  None Available. FINDINGS: Alignment: No static subluxation. Facets are aligned. Occipital condyles and the lateral masses of C1 and C2 are normally approximated. Skull base and vertebrae: No acute fracture. Soft tissues and spinal canal: No prevertebral fluid or swelling. No visible canal hematoma. Disc levels: No advanced spinal canal or neural foraminal stenosis. Upper chest: No pneumothorax, pulmonary nodule or pleural effusion. Other: Normal visualized paraspinal cervical soft tissues. IMPRESSION: No acute fracture or static subluxation of the cervical spine. Electronically Signed   By: Juanetta Nordmann M.D.   On: 11/10/2023 19:53   CT ANGIO HEAD NECK W WO CM Result Date:  11/10/2023 CLINICAL DATA:  Vertigo and fall EXAM: CT ANGIOGRAPHY HEAD AND NECK WITH AND WITHOUT CONTRAST TECHNIQUE: Multidetector CT imaging of the head and neck was performed using the standard protocol during bolus administration of intravenous contrast. Multiplanar CT image reconstructions and MIPs were obtained to evaluate the vascular anatomy. Carotid stenosis measurements (when applicable) are obtained utilizing NASCET criteria, using the distal internal carotid diameter as the denominator. RADIATION DOSE REDUCTION: This exam was performed according to the departmental dose-optimization program which includes automated exposure control, adjustment of the mA and/or kV according to patient size and/or use of iterative reconstruction technique. CONTRAST:  75mL OMNIPAQUE IOHEXOL 350 MG/ML SOLN COMPARISON:  None Available. FINDINGS: CT HEAD FINDINGS Brain: No mass, hemorrhage or extra-axial collection. Old right cerebellar infarct. Vascular:  No hyperdense vessel or unexpected vascular calcification. Skull: The visualized skull base, calvarium and extracranial soft tissues are normal. Sinuses/Orbits: No fluid levels or advanced mucosal thickening of the visualized paranasal sinuses. No mastoid or middle ear effusion. Normal orbits. CTA NECK FINDINGS Skeleton: No acute abnormality or high grade bony spinal canal stenosis. Other neck: Normal pharynx, larynx and major salivary glands. No cervical lymphadenopathy. Unremarkable thyroid  gland. Upper chest: No pneumothorax or pleural effusion. No nodules or masses. Aortic arch: There is no calcific atherosclerosis of the aortic arch. Conventional 3 vessel aortic branching pattern. RIGHT carotid system: Normal without aneurysm, dissection or stenosis. LEFT carotid system: Normal without aneurysm, dissection or stenosis. Vertebral arteries: Codominant configuration. There is no dissection, occlusion or flow-limiting stenosis to the skull base (V1-V3 segments). CTA HEAD FINDINGS POSTERIOR CIRCULATION: Vertebral arteries are normal. No proximal occlusion of the anterior or inferior cerebellar arteries. Basilar artery is normal. Superior cerebellar arteries are normal. Posterior cerebral arteries are normal. ANTERIOR CIRCULATION: Intracranial internal carotid arteries are normal. Anterior cerebral arteries are normal. Middle cerebral arteries are normal. Venous sinuses: As permitted by contrast timing, patent. Anatomic variants: Fetal origins of both posterior cerebral arteries. Review of the MIP images confirms the above findings. IMPRESSION: 1. No emergent large vessel occlusion or high-grade stenosis of the intracranial arteries. 2. Old right cerebellar infarct. Electronically Signed   By: Juanetta Nordmann M.D.   On: 11/10/2023 19:46   MR BRAIN WO CONTRAST Result Date: 11/10/2023 CLINICAL DATA:  Acute neurologic deficit EXAM: MRI HEAD WITHOUT CONTRAST TECHNIQUE: Multiplanar, multiecho pulse sequences of the brain  and surrounding structures were obtained without intravenous contrast. COMPARISON:  None Available. FINDINGS: Brain: Small focus of acute ischemia within the superior left cerebellar vermis. Old right cerebellar infarct. No acute or chronic hemorrhage. Minimal multifocal hyperintense T2-weight signal within the white matter. The midline structures are normal. Vascular: Normal flow voids. Skull and upper cervical spine: Normal calvarium and skull base. Visualized upper cervical spine and soft tissues are normal. Sinuses/Orbits:No paranasal sinus fluid levels or advanced mucosal thickening. No mastoid or middle ear effusion. Normal orbits. IMPRESSION: 1. Small focus of acute ischemia within the superior left cerebellar vermis. No hemorrhage or mass effect. 2. Old right cerebellar infarct. Electronically Signed   By: Juanetta Nordmann M.D.   On: 11/10/2023 19:24       The results of significant diagnostics from this hospitalization (including imaging, microbiology, ancillary and laboratory) are listed below for reference.     Microbiology: No results found for this or any previous visit (from the past 240 hours).   Labs:  CBC: Recent Labs  Lab 11/10/23 1447  WBC 11.4*  HGB 14.5  HCT 41.0  MCV 79.6*  PLT 229  BMP &GFR Recent Labs  Lab 11/10/23 1447  NA 137  K 4.2  CL 102  CO2 20*  GLUCOSE 177*  BUN 15  CREATININE 0.95  CALCIUM 10.2   Estimated Creatinine Clearance: 68.8 mL/min (by C-G formula based on SCr of 0.95 mg/dL). Liver & Pancreas: Recent Labs  Lab 11/10/23 1447  AST 27  ALT 15  ALKPHOS 104  BILITOT 0.6  PROT 8.7*  ALBUMIN 4.3   Recent Labs  Lab 11/10/23 1447  LIPASE 23   No results for input(s): "AMMONIA" in the last 168 hours. Diabetic: Recent Labs    11/10/23 2235  HGBA1C 5.7*   Recent Labs  Lab 11/11/23 0754 11/11/23 1127  GLUCAP 103* 108*   Cardiac Enzymes: No results for input(s): "CKTOTAL", "CKMB", "CKMBINDEX", "TROPONINI" in the last 168  hours. No results for input(s): "PROBNP" in the last 8760 hours. Coagulation Profile: No results for input(s): "INR", "PROTIME" in the last 168 hours. Thyroid  Function Tests: Recent Labs    11/10/23 2235  TSH 2.244   Lipid Profile: No results for input(s): "CHOL", "HDL", "LDLCALC", "TRIG", "CHOLHDL", "LDLDIRECT" in the last 72 hours. Anemia Panel: No results for input(s): "VITAMINB12", "FOLATE", "FERRITIN", "TIBC", "IRON", "RETICCTPCT" in the last 72 hours. Urine analysis:    Component Value Date/Time   COLORURINE YELLOW 11/10/2023 2124   APPEARANCEUR CLEAR 11/10/2023 2124   LABSPEC 1.031 (H) 11/10/2023 2124   PHURINE 6.0 11/10/2023 2124   GLUCOSEU NEGATIVE 11/10/2023 2124   HGBUR NEGATIVE 11/10/2023 2124   BILIRUBINUR NEGATIVE 11/10/2023 2124   KETONESUR 20 (A) 11/10/2023 2124   PROTEINUR 100 (A) 11/10/2023 2124   NITRITE NEGATIVE 11/10/2023 2124   LEUKOCYTESUR NEGATIVE 11/10/2023 2124   Sepsis Labs: Invalid input(s): "PROCALCITONIN", "LACTICIDVEN"   SIGNED:  Theadore Finger, MD  [facility] 11/11/2023, 6:22 PM

## 2023-11-11 NOTE — Evaluation (Signed)
 Occupational Therapy Evaluation Patient Details Name: Dawn Carlson MRN: [id number] DOB: Aug 25, 1950 Today's Date: 11/11/2023   History of Present Illness   73 yo female admitted 5/26 with dizziness, N/V with acute left cerebellar CVA. PMHx: hypothyroidism, CTR, tinnitus, obesity     Clinical Impressions Patient completing ECHO, continues to experience light dizziness with mobility, but no overt LOB noted.  PT on board for continued mobility, no significant OT needs exist in the acute setting.  Will defer to PT for mobility, and no post acute OT needed.  Patient has adequate support at home from her daughter if needed.       If plan is discharge home, recommend the following:   Assist for transportation     Functional Status Assessment   Patient has had a recent decline in their functional status and demonstrates the ability to make significant improvements in function in a reasonable and predictable amount of time.     Equipment Recommendations   Other (comment) (Could consider a tubbench, but not necessary.)     Recommendations for Other Services         Precautions/Restrictions   Precautions Precautions: Fall Restrictions Weight Bearing Restrictions Per Provider Order: No     Mobility Bed Mobility Overal bed mobility: Modified Independent                  Transfers Overall transfer level: Needs assistance   Transfers: Sit to/from Stand Sit to Stand: Supervision                  Balance Overall balance assessment: Mild deficits observed, not formally tested                                         ADL either performed or assessed with clinical judgement   ADL                                         General ADL Comments: generalized supervision due to continued unsteadiness and mild dizziness.     Vision Baseline Vision/History: 1 Wears glasses Patient Visual Report: No change from baseline        Perception Perception: Not tested       Praxis Praxis: Not tested       Pertinent Vitals/Pain Pain Assessment Pain Assessment: No/denies pain     Extremity/Trunk Assessment Upper Extremity Assessment Upper Extremity Assessment: Overall WFL for tasks assessed   Lower Extremity Assessment Lower Extremity Assessment: Overall WFL for tasks assessed   Cervical / Trunk Assessment Cervical / Trunk Assessment: Normal   Communication Communication Communication: No apparent difficulties   Cognition Arousal: Alert Behavior During Therapy: WFL for tasks assessed/performed Cognition: No apparent impairments                               Following commands: Intact       Cueing  General Comments   Cueing Techniques: Verbal cues   vSS on RA   Exercises     Shoulder Instructions      Home Living Family/patient expects to be discharged to:: Private residence Living Arrangements: Children Available Help at Discharge: Family;Available 24 hours/day Type of Home: House Home Access: Level entry     Home Layout:  Multi-level;Bed/bath upstairs Alternate Level Stairs-Number of Steps: 14 Alternate Level Stairs-Rails: Right Bathroom Shower/Tub: Chief Strategy Officer: Standard Bathroom Accessibility: Yes How Accessible: Accessible via walker Home Equipment: None   Additional Comments: 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  Patient continnes to drive.  Recently started walking for exercise.    OT Problem List: Impaired balance (sitting and/or standing)   OT Treatment/Interventions:        OT Goals(Current goals can be found in the care plan section)   Acute Rehab  OT Goals Patient Stated Goal: Return home OT Goal Formulation: With patient Time For Goal Achievement: 11/14/23 Potential to Achieve Goals: Good   OT Frequency:       Co-evaluation              AM-PAC OT "6 Clicks" Daily Activity     Outcome Measure Help from another person eating meals?: None Help from another person taking care of personal grooming?: None Help from another person toileting, which includes using toliet, bedpan, or urinal?: A Little Help from another person bathing (including washing, rinsing, drying)?: A Little Help from another person to put on and taking off regular upper body clothing?: None Help from another person to put on and taking off regular lower body clothing?: A Little 6 Click Score: 21   End of Session Equipment Utilized During Treatment: Gait belt Nurse Communication: Mobility status  Activity Tolerance: Patient tolerated treatment well Patient left: in bed;with call bell/phone within reach  OT Visit Diagnosis: Unsteadiness on feet (R26.81);Dizziness and giddiness (R42)                Time: 4098-1191 OT Time Calculation (min): 18 min Charges:  OT General Charges $OT Visit: 1 Visit OT Evaluation $OT Eval Moderate Complexity: 1 Mod  11/11/2023  RP, OTR/L  Acute Rehabilitation Services  Office:  [phone]   Benjamen Brand 11/11/2023, 11:25 AM

## 2023-11-11 NOTE — ED Notes (Signed)
Dr. Cyndia Skeeters at bedside.

## 2023-11-11 NOTE — Evaluation (Addendum)
 Physical Therapy Evaluation Patient Details Name: Dawn Carlson MRN: [id number] DOB: 1951/01/05 Today's Date: 11/11/2023  History of Present Illness  73 yo female admitted 5/26 with dizziness, N/V with acute left cerebellar CVA. PMHx: hypothyroidism, CTR, tinnitus, obesity  Clinical Impression  Pt pleasant, reports no dizziness at rest, 4/10 dizziness with gait. Pt with CGA for gait and mobility due to mild balance deficits and feeling of unsteadiness. Pt able to walk without UB support but benefits from guarding for safety. Pt lives with daughter who works from home and can provide adequate assist. Pt will benefit from acute therapy to maximize mobility, safety and function as well as OPPT to maximize balance.  Supine 166/85 (107) Sitting 178/88 (109) Standing after walk 178/96 HR 66-101  ,        If plan is discharge home, recommend the following: A little help with walking and/or transfers;Assist for transportation;Assistance with cooking/housework   Can travel by private vehicle        Equipment Recommendations BSC/3in1  Recommendations for Other Services  OT consult    Functional Status Assessment Patient has had a recent decline in their functional status and demonstrates the ability to make significant improvements in function in a reasonable and predictable amount of time.     Precautions / Restrictions Precautions Precautions: Fall      Mobility  Bed Mobility Overal bed mobility: Modified Independent                  Transfers Overall transfer level: Needs assistance   Transfers: Sit to/from Stand Sit to Stand: Supervision                Ambulation/Gait Ambulation/Gait assistance: Contact guard assist Gait Distance (Feet): 200 Feet Assistive device: None Gait Pattern/deviations: Step-through pattern, Decreased stride length   Gait velocity interpretation: 1.31 - 2.62 ft/sec, indicative of limited community ambulator   General Gait  Details: pt with cautious gait due to report of 4/10 dizziness with mobility. Pt withCGA for safety, no AD and did not require UB support. Cues for visual target with mobility  Stairs            Wheelchair Mobility     Tilt Bed    Modified Rankin (Stroke Patients Only) Modified Rankin (Stroke Patients Only) Pre-Morbid Rankin Score: No symptoms Modified Rankin: Moderate disability     Balance Overall balance assessment: Mild deficits observed, not formally tested                                           Pertinent Vitals/Pain Pain Assessment Pain Assessment: No/denies pain    Home Living Family/patient expects to be discharged to:: Private residence Living Arrangements: Children Available Help at Discharge: Family;Available 24 hours/day Type of Home: House Home Access: Level entry     Alternate Level Stairs-Number of Steps: 14 Home Layout: Multi-level;Bed/bath upstairs Home Equipment: None Additional Comments: 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, she does the cleaning and medication    Prior Function Prior Level of Function : Independent/Modified Independent             Mobility Comments: pt normally independent, walks without AD       Extremity/Trunk Assessment   Upper Extremity Assessment Upper Extremity Assessment: Overall WFL for tasks assessed    Lower Extremity Assessment Lower Extremity Assessment: Overall WFL for tasks assessed  Cervical / Trunk Assessment Cervical / Trunk Assessment: Normal  Communication   Communication Communication: No apparent difficulties    Cognition Arousal: Alert Behavior During Therapy: WFL for tasks assessed/performed   PT - Cognitive impairments: No apparent impairments                         Following commands: Intact       Cueing Cueing Techniques: Verbal cues     General Comments      Exercises     Assessment/Plan    PT Assessment Patient needs  continued PT services  PT Problem List Decreased activity tolerance;Decreased balance;Decreased mobility       PT Treatment Interventions Gait training;Stair training;Functional mobility training;Therapeutic activities;Patient/family education;Neuromuscular re-education;Balance training    PT Goals (Current goals can be found in the Care Plan section)  Acute Rehab PT Goals Patient Stated Goal: return home, care for brothers PT Goal Formulation: With patient Time For Goal Achievement: 11/25/23 Potential to Achieve Goals: Good    Frequency Min 2X/week     Co-evaluation               AM-PAC PT "6 Clicks" Mobility  Outcome Measure Help needed turning from your back to your side while in a flat bed without using bedrails?: None Help needed moving from lying on your back to sitting on the side of a flat bed without using bedrails?: None Help needed moving to and from a bed to a chair (including a wheelchair)?: A Little Help needed standing up from a chair using your arms (e.g., wheelchair or bedside chair)?: A Little Help needed to walk in hospital room?: A Little Help needed climbing 3-5 steps with a railing? : A Little 6 Click Score: 20    End of Session Equipment Utilized During Treatment: Gait belt Activity Tolerance: Patient tolerated treatment well Patient left: in bed;with call bell/phone within reach Nurse Communication: Mobility status PT Visit Diagnosis: Other abnormalities of gait and mobility (R26.89);Difficulty in walking, not elsewhere classified (R26.2);Other symptoms and signs involving the nervous system (R29.898)    Time: 1610-9604 PT Time Calculation (min) (ACUTE ONLY): 15 min   Charges:   PT Evaluation $PT Eval Moderate Complexity: 1 Mod   PT General Charges $$ ACUTE PT VISIT: 1 Visit         Annis Baseman, PT Acute Rehabilitation Services Office: [phone]   Dawn Carlson 11/11/2023, 8:28 AM

## 2023-11-11 NOTE — Progress Notes (Signed)
 Transition of Care [facility]) - Inpatient Brief Assessment   Patient Details  Name: Dawn Carlson MRN: [id number] Date of Birth: February 18, 1951  Transition of Care [facility]) CM/SW Contact:    Joanette Moynahan, RN Phone Number: 11/11/2023, 3:38 PM   Clinical Narrative:  Transition of Care Department [facility]) has reviewed with patient at bedside and no TOC needs have been identified at this time. We will continue to monitor patient advancement through interdisciplinary progression rounds. If new patient transition needs arise, please place a TOC consult.     Transition of Care Asessment: Insurance and Status: (P) Insurance coverage has been reviewed Patient has primary care physician: (P) Yes Home environment has been reviewed: (P) from home with daughter Prior level of function:: (P) independent Prior/Current Home Services: (P) No current home services Social Drivers of Health Review: (P) SDOH reviewed no interventions necessary Readmission risk has been reviewed: (P) Yes Transition of care needs: (P) no transition of care needs at this time

## 2023-11-11 NOTE — Care Management Obs Status (Signed)
 MEDICARE OBSERVATION STATUS NOTIFICATION   Patient Details  Name: Dawn Carlson MRN: [id number] Date of Birth: [birth year]   Medicare Observation Status Notification Given:  Yes    Joanette Moynahan, RN 11/11/2023, 3:35 PM

## 2023-11-11 NOTE — Progress Notes (Addendum)
 STROKE TEAM PROGRESS NOTE   INTERIM HISTORY/SUBJECTIVE States she had an episode of vertigo, room spinning when she got up to use the bathroom. She got up from the commode and felt dizzy. Lasted all day yesterday but feeling much better today.   OBJECTIVE  CBC    Component Value Date/Time   WBC 11.4 (H) 11/10/2023 1447   RBC 5.15 (H) 11/10/2023 1447   HGB 14.5 11/10/2023 1447   HGB 13.9 04/09/2022 0938   HCT 41.0 11/10/2023 1447   HCT 40.6 04/09/2022 0938   PLT 229 11/10/2023 1447   PLT 237 04/09/2022 0938   MCV 79.6 (L) 11/10/2023 1447   MCV 85 04/09/2022 0938   MCH 28.2 11/10/2023 1447   MCHC 35.4 11/10/2023 1447   RDW 14.5 11/10/2023 1447   RDW 14.4 04/09/2022 0938   LYMPHSABS 2.4 04/09/2022 0938   EOSABS 0.2 04/09/2022 0938   BASOSABS 0.1 04/09/2022 0938    BMET    Component Value Date/Time   NA 137 11/10/2023 1447   NA 139 04/09/2022 0938   K 4.2 11/10/2023 1447   CL 102 11/10/2023 1447   CO2 20 (L) 11/10/2023 1447   GLUCOSE 177 (H) 11/10/2023 1447   BUN 15 11/10/2023 1447   BUN 17 04/09/2022 0938   CREATININE 0.95 11/10/2023 1447   CALCIUM 10.2 11/10/2023 1447   EGFR 56 (L) 04/09/2022 0938   GFRNONAA >60 11/10/2023 1447    IMAGING past 24 hours DG Chest 2 View Result Date: 11/10/2023 CLINICAL DATA:  84696 TIA (transient ischemic attack) 29528 EXAM: CHEST - 2 VIEW COMPARISON:  None Available. FINDINGS: The heart and mediastinal contours are within normal limits. Atherosclerotic plaque. No focal consolidation. No pulmonary edema. No pleural effusion. No pneumothorax. No acute osseous abnormality. IMPRESSION: 1. No active cardiopulmonary disease. 2.  Aortic Atherosclerosis (ICD10-I70.0). Electronically Signed   By: Morgane  Naveau M.D.   On: 11/10/2023 22:58   CT C-SPINE NO CHARGE Result Date: 11/10/2023 CLINICAL DATA:  Vertigo and left leg pain EXAM: CT CERVICAL SPINE WITHOUT CONTRAST TECHNIQUE: Multidetector CT imaging of the cervical spine was performed  without intravenous contrast. Multiplanar CT image reconstructions were also generated. RADIATION DOSE REDUCTION: This exam was performed according to the departmental dose-optimization program which includes automated exposure control, adjustment of the mA and/or kV according to patient size and/or use of iterative reconstruction technique. COMPARISON:  None Available. FINDINGS: Alignment: No static subluxation. Facets are aligned. Occipital condyles and the lateral masses of C1 and C2 are normally approximated. Skull base and vertebrae: No acute fracture. Soft tissues and spinal canal: No prevertebral fluid or swelling. No visible canal hematoma. Disc levels: No advanced spinal canal or neural foraminal stenosis. Upper chest: No pneumothorax, pulmonary nodule or pleural effusion. Other: Normal visualized paraspinal cervical soft tissues. IMPRESSION: No acute fracture or static subluxation of the cervical spine. Electronically Signed   By: Juanetta Nordmann M.D.   On: 11/10/2023 19:53   CT ANGIO HEAD NECK W WO CM Result Date: 11/10/2023 CLINICAL DATA:  Vertigo and fall EXAM: CT ANGIOGRAPHY HEAD AND NECK WITH AND WITHOUT CONTRAST TECHNIQUE: Multidetector CT imaging of the head and neck was performed using the standard protocol during bolus administration of intravenous contrast. Multiplanar CT image reconstructions and MIPs were obtained to evaluate the vascular anatomy. Carotid stenosis measurements (when applicable) are obtained utilizing NASCET criteria, using the distal internal carotid diameter as the denominator. RADIATION DOSE REDUCTION: This exam was performed according to the departmental dose-optimization program which includes automated  exposure control, adjustment of the mA and/or kV according to patient size and/or use of iterative reconstruction technique. CONTRAST:  75mL OMNIPAQUE  IOHEXOL  350 MG/ML SOLN COMPARISON:  None Available. FINDINGS: CT HEAD FINDINGS Brain: No mass, hemorrhage or extra-axial  collection. Old right cerebellar infarct. Vascular: No hyperdense vessel or unexpected vascular calcification. Skull: The visualized skull base, calvarium and extracranial soft tissues are normal. Sinuses/Orbits: No fluid levels or advanced mucosal thickening of the visualized paranasal sinuses. No mastoid or middle ear effusion. Normal orbits. CTA NECK FINDINGS Skeleton: No acute abnormality or high grade bony spinal canal stenosis. Other neck: Normal pharynx, larynx and major salivary glands. No cervical lymphadenopathy. Unremarkable thyroid  gland. Upper chest: No pneumothorax or pleural effusion. No nodules or masses. Aortic arch: There is no calcific atherosclerosis of the aortic arch. Conventional 3 vessel aortic branching pattern. RIGHT carotid system: Normal without aneurysm, dissection or stenosis. LEFT carotid system: Normal without aneurysm, dissection or stenosis. Vertebral arteries: Codominant configuration. There is no dissection, occlusion or flow-limiting stenosis to the skull base (V1-V3 segments). CTA HEAD FINDINGS POSTERIOR CIRCULATION: Vertebral arteries are normal. No proximal occlusion of the anterior or inferior cerebellar arteries. Basilar artery is normal. Superior cerebellar arteries are normal. Posterior cerebral arteries are normal. ANTERIOR CIRCULATION: Intracranial internal carotid arteries are normal. Anterior cerebral arteries are normal. Middle cerebral arteries are normal. Venous sinuses: As permitted by contrast timing, patent. Anatomic variants: Fetal origins of both posterior cerebral arteries. Review of the MIP images confirms the above findings. IMPRESSION: 1. No emergent large vessel occlusion or high-grade stenosis of the intracranial arteries. 2. Old right cerebellar infarct. Electronically Signed   By: Juanetta Nordmann M.D.   On: 11/10/2023 19:46   MR BRAIN WO CONTRAST Result Date: 11/10/2023 CLINICAL DATA:  Acute neurologic deficit EXAM: MRI HEAD WITHOUT CONTRAST TECHNIQUE:  Multiplanar, multiecho pulse sequences of the brain and surrounding structures were obtained without intravenous contrast. COMPARISON:  None Available. FINDINGS: Brain: Small focus of acute ischemia within the superior left cerebellar vermis. Old right cerebellar infarct. No acute or chronic hemorrhage. Minimal multifocal hyperintense T2-weight signal within the white matter. The midline structures are normal. Vascular: Normal flow voids. Skull and upper cervical spine: Normal calvarium and skull base. Visualized upper cervical spine and soft tissues are normal. Sinuses/Orbits:No paranasal sinus fluid levels or advanced mucosal thickening. No mastoid or middle ear effusion. Normal orbits. IMPRESSION: 1. Small focus of acute ischemia within the superior left cerebellar vermis. No hemorrhage or mass effect. 2. Old right cerebellar infarct. Electronically Signed   By: Juanetta Nordmann M.D.   On: 11/10/2023 19:24    Vitals:   11/11/23 0745 11/11/23 0800 11/11/23 0900 11/11/23 0930  BP: 111/84 (!) 151/79 (!) 148/73   Pulse: 65 68 67 68  Resp: 11 14 (!) 22 13  Temp:   98 F (36.7 C)   TempSrc:      SpO2: 100% 100% 100% 99%  Weight:      Height:         PHYSICAL EXAM General:  Alert, well-nourished, well-developed patient in no acute distress Psych:  Mood and affect appropriate for situation CV: Regular rate and rhythm on monitor Respiratory:  Regular, unlabored respirations on room air GI: Abdomen soft and nontender   NEURO:  Mental Status: AA&Ox3, patient is able to give clear and coherent history Speech/Language: speech is without dysarthria or aphasia.  Naming, repetition, fluency, and comprehension intact.  Cranial Nerves:  II: PERRL. Visual fields full.  III, IV, VI: EOMI.  Eyelids elevate symmetrically. Denies diplopia, does wear glasses V: Sensation is intact to light touch and symmetrical to face.  VII: Face is symmetrical resting and smiling VIII: hearing intact to voice. IX, X:  Palate elevates symmetrically. Phonation is normal.  WU:[id number] shrug 5/5. XII: tongue is midline without fasciculations. Motor: 5/5 strength to all muscle groups tested.  Tone: is normal and bulk is normal Sensation- Intact to light touch bilaterally. Extinction absent to light touch to DSS.   Coordination: FTN intact bilaterally, HKS: no ataxia in BLE.No drift.  Gait- deferred, cautious gait reported with PT, no ataxia  Most Recent NIH 0  ASSESSMENT/PLAN  [name] is a 73 y.o. female with history of  thyroid  disease and carpal tunnel syndrome who presented to the ED with complaints of vertigo and gait unsteadiness that were noted on awakening Monday. She experienced N/V and leaning to the left .    Acute Ischemic Infarct:  left cerebellar vermis, etiology: Likely Small vessel disease CT head old right cerebellar infarct CTA head & neck no LVO or high-grade stenosis of the intracranial arteries.  MRI   Small focus of acute ischemia within the superior left cerebellar vermis. 2D Echo EF 65-70% LDL 195 HgbA1c 5.7 VTE prophylaxis -SCDs No antithrombotic prior to admission, now on aspirin 81 mg daily and clopidogrel 75 mg daily for 3 weeks and then aspirin alone. Therapy recommendations:  Outpatient PT  Disposition: Pending  Hx of Stroke/TIA Old right cerebellar infarct on brain imaging - no symptoms or previous work up  Like a silent stroke  Hypertension Home meds: None Mildly elevated BP on presentation Stable now Long-term BP goal normotensive  Hyperlipidemia Home meds: None LDL 195, goal < 70 Add Crestor 40 mg Continue statin at discharge  Other Stroke Risk Factors Obesity, Body mass index is 38.74 kg/m., BMI >/= 30 associated with increased stroke risk, recommend weight loss, diet and exercise as appropriate   Other Active Problems Hypothyroidism, on synthroid   Hospital day # 1  Patient seen and examined by NP/APP with MD. MD to update note as needed.    Dawn Mana, DNP, FNP-BC [facility] Pager: [phone]  ATTENDING NOTE: I reviewed above note and agree with the assessment and plan. Pt was seen and examined.   No family at bedside.  Patient symptoms much improved, stated that nausea vomiting has resolved.  Still has very slight dizziness on walking but did good job with PT and OT, now recommend outpatient PT.  On exam, no nystagmus, ataxia or disconjugate gaze.  MRI showed left cerebellar vermis small infarct, likely small vessel disease.  Continue DAPT for 3 weeks and then aspirin alone.  Put on high-intensity statin.  For detailed assessment and plan, please refer to above as I have made changes wherever appropriate.   Neurology will sign off. Please call with questions. Pt will follow up with stroke clinic NP at [facility] in about 4 weeks. Thanks for the consult.   Consuelo Denmark, MD PhD Stroke Neurology 11/11/2023 2:53 PM     To contact Stroke Continuity provider, please refer to [email or web address]. After hours, contact General Neurology

## 2023-11-13 ENCOUNTER — Ambulatory Visit: Payer: Self-pay

## 2023-11-13 ENCOUNTER — Telehealth (HOSPITAL_BASED_OUTPATIENT_CLINIC_OR_DEPARTMENT_OTHER): Payer: Self-pay | Admitting: *Deleted

## 2023-11-13 NOTE — Telephone Encounter (Signed)
 Dr. De [place], please advise what you recommend.

## 2023-11-13 NOTE — Telephone Encounter (Signed)
 Pt daughter calling to sched hosp f/u. Pt recently had stroke, was discharged and her BP is still elevated. Daughter would like to know next steps. F/u sched for 6/12 with pcp. Pt not having any new s/s, most recent BP 154/102.   Copied from CRM [id number]. Topic: Clinical - Red Word Triage >> Nov 13, 2023 11:39 AM Alethia Huxley E wrote: [facility] that prompted transfer to Nurse Triage: Elevated blood pressure. Patient's blood pressure from today was 154/102. Was seen in ED yesterday, but daughter worried about symptoms.

## 2023-11-13 NOTE — Telephone Encounter (Signed)
 Copied from CRM [id number]. Topic: Appointments - Transfer of Care >> Nov 13, 2023 11:43 AM Alethia Huxley E wrote: Pt is requesting to transfer FROM: Dr. Gita Lamb de [place] Pt is requesting to transfer TO: Dr. Cinda Craze Reason for requested transfer: Daughter was wanting mom to have the same provider as her. It is the responsibility of the team the patient would like to transfer to (Dr. Cinda Craze) to reach out to the patient if for any reason this transfer is not acceptable.

## 2023-11-13 NOTE — Telephone Encounter (Signed)
 noted

## 2023-11-13 NOTE — Telephone Encounter (Signed)
 Called and spoke with pt about the info from Dr Eddie Good [place] and she stated to go ahead and schedule her at 10:50 on 6/4. Told pt if symptoms worsened for her to seek emergency care and she verbalized understanding. Nothing further needed.

## 2023-11-19 ENCOUNTER — Other Ambulatory Visit: Payer: Self-pay

## 2023-11-19 ENCOUNTER — Encounter (HOSPITAL_BASED_OUTPATIENT_CLINIC_OR_DEPARTMENT_OTHER): Payer: Self-pay | Admitting: Family Medicine

## 2023-11-19 ENCOUNTER — Other Ambulatory Visit (HOSPITAL_BASED_OUTPATIENT_CLINIC_OR_DEPARTMENT_OTHER): Payer: Self-pay

## 2023-11-19 ENCOUNTER — Ambulatory Visit: Attending: Student | Admitting: Physical Therapy

## 2023-11-19 ENCOUNTER — Ambulatory Visit (HOSPITAL_BASED_OUTPATIENT_CLINIC_OR_DEPARTMENT_OTHER): Admitting: Family Medicine

## 2023-11-19 VITALS — BP 138/82 | HR 64 | Ht 66.0 in | Wt 235.7 lb

## 2023-11-19 DIAGNOSIS — R2681 Unsteadiness on feet: Secondary | ICD-10-CM

## 2023-11-19 DIAGNOSIS — I10 Essential (primary) hypertension: Secondary | ICD-10-CM | POA: Insufficient documentation

## 2023-11-19 DIAGNOSIS — I639 Cerebral infarction, unspecified: Secondary | ICD-10-CM

## 2023-11-19 DIAGNOSIS — R42 Dizziness and giddiness: Secondary | ICD-10-CM | POA: Diagnosis present

## 2023-11-19 DIAGNOSIS — G4730 Sleep apnea, unspecified: Secondary | ICD-10-CM | POA: Diagnosis not present

## 2023-11-19 DIAGNOSIS — I63342 Cerebral infarction due to thrombosis of left cerebellar artery: Secondary | ICD-10-CM | POA: Diagnosis not present

## 2023-11-19 DIAGNOSIS — R2689 Other abnormalities of gait and mobility: Secondary | ICD-10-CM

## 2023-11-19 DIAGNOSIS — E039 Hypothyroidism, unspecified: Secondary | ICD-10-CM | POA: Diagnosis not present

## 2023-11-19 MED ORDER — AMLODIPINE BESYLATE 2.5 MG PO TABS
2.5000 mg | ORAL_TABLET | Freq: Every day | ORAL | 1 refills | Status: DC
  Filled 2023-11-19: qty 90, 90d supply, fill #0
  Filled 2024-02-18: qty 90, 90d supply, fill #1

## 2023-11-19 NOTE — Progress Notes (Signed)
    Procedures performed today:    None.  Independent interpretation of notes and tests performed by another provider:   None.  Brief History, Exam, Impression, and Recommendations:    BP 138/82 (BP Location: Right Arm, Patient Position: Sitting, Cuff Size: Normal)   Pulse 64   Ht 5\' 6"  (1.676 m)   Wt 235 lb 11.2 oz (106.9 kg)   SpO2 98%   BMI 38.04 kg/m   Patient presents for hospital follow-up.  Patient presented to emergency department about 10 days ago due to vertigo, nausea and vomiting.  Evaluation revealed acute stroke as well as evidence of prior ischemic changes.  She did have evaluation with neurology during her short hospitalization.  She did have improvement in symptoms and was discharged with aspirin  and Plavix  as well as statin.  She has continued with medications since discharge.  She does have follow-up scheduled with neurology later this month.  She is accompanied to office today by her daughter.  Since being at home, she has had some blood pressure elevations.      Primary hypertension Assessment & Plan: Blood pressure in office today is borderline controlled.  We did discuss medication considerations open can start with low-dose of amlodipine.  Cautioned on potential side effects.  Recommend continuing with intermittent monitoring of blood pressure at home, DASH diet  Orders: -     CBC with Differential/Platelet -     Comprehensive metabolic panel with GFR  Sleep apnea, unspecified type -     Ambulatory referral to Neurology  Hypothyroidism, unspecified type Assessment & Plan: Patient reports that she noted itching with higher dose of medication and stop medication and reports that she has been doing well since then.  On further discussion, she reports that she has not been taking medication for about a year, despite indicating that she was taking medication at prior office visits. Most recent TSH was normal and given that she reports having been without  medication for greater than a year we can hold off on medication at this time and plan to monitor TSH intermittently moving forward.   Acute CVA (cerebrovascular accident) [facility]) Assessment & Plan: Recommend continuing with close follow-up with neurology.  We discussed medication recommendations, continue with statin therapy and dual antiplatelet therapy for 21 days as discussed at time of discharge from the hospital.  She additionally was recommended to have physical therapy as an outpatient, she did have appointment today.  Recommend continuing with this as indicated.   Other orders -     amLODIPine Besylate; Take 1 tablet (2.5 mg total) by mouth daily.  Dispense: 90 tablet; Refill: 1  Return in about 6 weeks (around 12/31/2023) for hypertension, med check.  Spent 34 minutes on this patient encounter, including preparation, chart review, face-to-face counseling with patient and coordination of care, and documentation of encounter   ___________________________________________ Cecile Gillispie de [place], MD, ABFM, [facility] Primary Care and Sports Medicine [facility]

## 2023-11-19 NOTE — Patient Instructions (Signed)
  Medication Instructions:  Your physician recommends that you continue on your current medications as directed. Please refer to the Current Medication list given to you today. --If you need a refill on any your medications before your next appointment, please call your pharmacy first. If no refills are authorized on file call the office.-- Lab Work: Your physician has recommended that you have lab work today: today If you have labs (blood work) drawn today and your tests are completely normal, you will receive your results via MyChart message OR a phone call from our staff.  Please ensure you check your voicemail in the event that you authorized detailed messages to be left on a delegated number. If you have any lab test that is abnormal or we need to change your treatment, we will call you to review the results.  Follow-Up: Your next appointment:   Your physician recommends that you schedule a follow-up appointment in: 6-8 week follow up cancel July appt  with Dr. de Peru  You will receive a text message or e-mail with a link to a survey about your care and experience with us  today! We would greatly appreciate your feedback!   Thanks for letting us  be apart of your health journey!!  Primary Care and Sports Medicine   Dr. Court Distance Peru   We encourage you to activate your patient portal called "MyChart".  Sign up information is provided on this After Visit Summary.  MyChart is used to connect with patients for Virtual Visits (Telemedicine).  Patients are able to view lab/test results, encounter notes, upcoming appointments, etc.  Non-urgent messages can be sent to your provider as well. To learn more about what you can do with MyChart, please visit --  [email or web address].

## 2023-11-19 NOTE — Assessment & Plan Note (Signed)
 Patient reports that she noted itching with higher dose of medication and stop medication and reports that she has been doing well since then.  On further discussion, she reports that she has not been taking medication for about a year, despite indicating that she was taking medication at prior office visits. Most recent TSH was normal and given that she reports having been without medication for greater than a year we can hold off on medication at this time and plan to monitor TSH intermittently moving forward.

## 2023-11-19 NOTE — Therapy (Signed)
 OUTPATIENT PHYSICAL THERAPY NEURO EVALUATION   Patient Name: Dawn Carlson MRN: [id number] DOB:[birth year], 73 y.o., female Today's Date: 11/19/2023   PCP: de Peru, Alonza Jansky, MD REFERRING PROVIDER: Theadore Finger, MD >follow up with de Peru, Raymond J, MD   END OF SESSION:  PT End of Session - 11/19/23 0939     Visit Number 1    Number of Visits 13    Date for PT Re-Evaluation 01/02/24    Authorization Type [employer]-auth submitted    PT Start Time 0935    PT Stop Time 1015    PT Time Calculation (min) 40 min    Equipment Utilized During Treatment Gait belt    Activity Tolerance Patient tolerated treatment well    Behavior During Therapy [facility] for tasks assessed/performed             Past Medical History:  Diagnosis Date   Rash 03/12/2021   Stroke [facility])    Thyroid  disease    Past Surgical History:  Procedure Laterality Date   CARPAL TUNNEL RELEASE     Patient Active Problem List   Diagnosis Date Noted   Primary hypertension 11/19/2023   Acute CVA (cerebrovascular accident) (HCC) 11/11/2023   Cerebellar stroke, acute (HCC) 11/11/2023   Hyperlipidemia 11/11/2023   Wellness examination 04/16/2022   Tick bite of abdominal wall 03/28/2022   Right knee pain 11/14/2021   Class 2 severe obesity due to excess calories with serious comorbidity and body mass index (BMI) of 37.0 to 37.9 in adult (HCC) 05/08/2021   Blood pressure elevated without history of HTN 05/08/2021   Pre-diabetes 05/08/2021   Insulin  resistance 05/08/2021   Acanthosis nigricans 05/08/2021   At high risk for cardiovascular disease 05/08/2021   Encounter for medical examination to establish care 03/12/2021   Leg heaviness 03/12/2021   Familial hyperlipidemia, 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   Tinnitus, bilateral 08/25/2018   Hypothyroidism 04/07/2006   DENTAL CARIES, SEVERE 04/07/2006   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/2006   CARPAL TUNNEL RELEASE, HX OF 04/07/2006    ONSET DATE:  11/11/2023  REFERRING DIAG: W29.562 (ICD-10-CM) - 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  I couldn't hold my balance and I was falling all over the place, was being pulled to the left.  The major part of the symptoms did subside.  Things have improved from last week, but still will get off balance if I'm up too long. Pt accompanied by: self  PERTINENT HISTORY: 11/10/2023-11/11/2023 PMH of hypothyroidism, class II obesity and prediabetes presented to [facility] with vertigo, nausea and vomiting, and found to have small focus of acute ischemia within the superior left cerebral vermis and old right cerebellar infarct on MRI.  CT head and CT angio head and neck were unremarkable other than old right cerebral infarct.   PAIN:  Are you having pain? Occasional headaches that are brief  PRECAUTIONS: Fall  RED FLAGS: None   WEIGHT BEARING RESTRICTIONS: No  FALLS: Has patient fallen in last 6 months? Yes. Number of falls 1 fall on day she went to ED  LIVING ENVIRONMENT: Lives with: lives with their family Lives in: House/apartment Stairs: 2 steps to get into home; 2 flights of 7-8 steps to bedroom with rails Has following equipment at home: None  PLOF: Independent  PATIENT GOALS: To get better balance  OBJECTIVE:  Note: Objective measures were completed at Evaluation unless otherwise noted.  DIAGNOSTIC FINDINGS: see above in pertinent hx section  COGNITION: Overall cognitive  status: Within functional limits for tasks assessed    VESTIBULAR ASSESSMENT   GENERAL OBSERVATION: No acute distress.   Turning quickly brings on dizziness. Quick head movements bring on dizziness.  Feel like I've definitely improved. (Initial ED visit-was being pulled to L side, falling all over the place and this has improved).  Have had vertigo in the past   OCULOMOTOR EXAM:   Ocular ROM: No Limitations   Spontaneous Nystagmus: absent   Gaze-Induced Nystagmus: absent   Smooth Pursuits: intact   Saccades: intact   Convergence/Divergence: approx 18 cm    VESTIBULAR - OCULAR REFLEX:    Slow VOR: Comment: unable to sustain, feels dizzy, 6/10   VOR Cancellation: Comment: performs slowly, rates 3-4/10 dizziness   Head-Impulse Test: HIT Left: positive       POSITIONAL TESTING: NT due to pt's c/o not in line with positional vertigo today          M-CTSIB  Condition 1: Firm Surface, EO 30 Sec, Mild Sway  Condition 2: Firm Surface, EC 30 Sec, Moderate Sway  Condition 3: Foam Surface, EO 30 Sec, Moderate Sway  Condition 4: Foam Surface, EC 10 Sec, Severe Sway    TRANSFERS: Sit to stand: Modified independence  Assistive device utilized: None     Stand to sit: Modified independence  Assistive device utilized: None       GAIT: Findings: Gait Characteristics: Trendelenburg/increased L lean with gait, step through pattern, decreased step length- Left, decreased stance time- Right, lateral lean- Left, and wide BOS, Distance walked: 50 ft, Assistive device utilized:None, Level of assistance: SBA, and Comments: increased lean to L with gait activities in DGI  FUNCTIONAL TESTS:  10 meter walk test: 16.69 sec (1.97 ft/sec) Dynamic Gait Index: 10/24 *Scores <19/24 indicate increased fall risk  [facility] PT Assessment - 11/19/23 1011       Standardized Balance Assessment   Standardized Balance Assessment Dynamic Gait Index      Dynamic Gait Index   Level Surface Moderate Impairment     Change in Gait Speed Moderate Impairment    Gait with Horizontal Head Turns  Moderate Impairment    Gait with Vertical Head Turns Moderate Impairment    Gait and Pivot Turn Mild Impairment    Step Over Obstacle Severe Impairment    Step Around Obstacles Mild Impairment    Steps Mild Impairment    Total Score 10             PATIENT SURVEYS:  DHI 38                                                                                                                              TREATMENT DATE: 11/19/2023    PATIENT EDUCATION: Education details: Eval results, POC Person educated: Patient Education method: Explanation Education comprehension: verbalized understanding  HOME EXERCISE PROGRAM: Not yet initiated  GOALS: Goals reviewed with patient? Yes  SHORT TERM GOALS: Target date: 12/18/2023  Pt will be independent with HEP for improved balance, gait. Baseline: Goal status: INITIAL  2.  Pt will improve DGI score to at least 15/24 to decrease fall risk. Baseline: 10/24 Goal status: INITIAL  LONG TERM GOALS: Target date: 01/02/2024  Pt will be independent with HEP for improved balance, dizziness, gait. Baseline:  Goal status: INITIAL  2.  Pt will improve DGI score to at least 19/24 to decrease fall risk. Baseline: 10/24 Goal status: INITIAL  3.  Pt will improve gait velocity to at least 2.62 ft/sec for improved gait efficiency and safety. Baseline: 1.97 ft/sec Goal status: INITIAL  4.  Pt will perform Condition 4 on MCTSIB 30 sec mod sway or better for improved balance. Baseline: 10 sec severe sway Goal status: INITIAL  5.  Pt will improve DHI score to 20 or less for improved dizziness. Baseline: 38 Goal status: INITIAL   ASSESSMENT:  CLINICAL IMPRESSION: Patient is a 73 y.o. female who was seen today for physical therapy evaluation and treatment for dizziness, CVA.  She presented to ED 11/10/2023 with intense pulling sensation to L, significant spinning and being  off-balance.  She reports this is improving, but she does continue to note dizziness and imbalance.  With mobility measures, she demo slowed gait velocity (limited community ambulator) at 1.97 ft/sec and increased fall risk with DGI score of 10/24.  She demo decreased vestibular system use for balance with ability to perform Condition 4 on MCTSIB x 10 sec only prior to LOB.  She has increased dizziness symptoms with VOR and + HIT to L.  Prior to this CVA, she was independent.  She will benefit from skilled PT to address the above stated deficits to improve overall functional mobility and independence and to decrease fall risk.   OBJECTIVE IMPAIRMENTS: Abnormal gait, decreased balance, decreased mobility, difficulty walking, and dizziness.   ACTIVITY LIMITATIONS: bending, standing, locomotion level, and caring for others  PARTICIPATION LIMITATIONS: meal prep, cleaning, laundry, and community activity  PERSONAL FACTORS: 3+ comorbidities: See PMH above are also affecting patient's functional outcome.   REHAB POTENTIAL: Good  CLINICAL DECISION MAKING: Evolving/moderate  complexity  EVALUATION COMPLEXITY: Moderate  PLAN:  PT FREQUENCY: 2x/week  PT DURATION: 6 weeks plus eval  PLANNED INTERVENTIONS: 97750- Physical Performance Testing, 97110-Therapeutic exercises, 97530- Therapeutic activity, [id number]- Neuromuscular re-education, 97535- Self Care, 16109- Gait training, Patient/Family education, Balance training, and Vestibular training  PLAN FOR NEXT SESSION: Initiate HEP to address VOR, multi-sensory balance, gait training.  Check motion sensitivity and provide habituation at needed  Kelsey Yeily., PT 11/19/2023, 5:28 PM   [facility] at [facility] [address] [place], [address] [place], [place] [address] Phone # [phone] Fax # [phone]   Referring diagnosis? 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 (if different than referring diagnosis) R 42, R26.81, R26.89 What was this (referring dx) caused by? []  Surgery []  Fall []  Ongoing issue []  Arthritis [x]  Other: CVA____________  Laterality: [x]  Rt [x]  Lt [x]  Both  Check all possible CPT codes:  *CHOOSE 10 OR LESS*    See Planned Interventions listed in the Plan section of the Evaluation.

## 2023-11-19 NOTE — Assessment & Plan Note (Signed)
 Blood pressure in office today is borderline controlled.  We did discuss medication considerations open can start with low-dose of amlodipine.  Cautioned on potential side effects.  Recommend continuing with intermittent monitoring of blood pressure at home, DASH diet

## 2023-11-19 NOTE — Assessment & Plan Note (Signed)
 Recommend continuing with close follow-up with neurology.  We discussed medication recommendations, continue with statin therapy and dual antiplatelet therapy for 21 days as discussed at time of discharge from the hospital.  She additionally was recommended to have physical therapy as an outpatient, she did have appointment today.  Recommend continuing with this as indicated.

## 2023-11-20 LAB — COMPREHENSIVE METABOLIC PANEL WITH GFR

## 2023-11-20 LAB — CBC WITH DIFFERENTIAL/PLATELET
Basophils Absolute: 0.1 10*3/uL (ref 0.0–0.2)
Basos: 1 %
EOS (ABSOLUTE): 0.2 10*3/uL (ref 0.0–0.4)
Eos: 2 %
Hematocrit: 44.6 % (ref 34.0–46.6)
Hemoglobin: 14.6 g/dL (ref 11.1–15.9)
Immature Grans (Abs): 0 10*3/uL (ref 0.0–0.1)
Immature Granulocytes: 0 %
Lymphocytes Absolute: 2.7 10*3/uL (ref 0.7–3.1)
Lymphs: 31 %
MCH: 28.3 pg (ref 26.6–33.0)
MCHC: 32.7 g/dL (ref 31.5–35.7)
MCV: 87 fL (ref 79–97)
Monocytes Absolute: 0.8 10*3/uL (ref 0.1–0.9)
Monocytes: 9 %
Neutrophils Absolute: 4.8 10*3/uL (ref 1.4–7.0)
Neutrophils: 57 %
Platelets: 264 10*3/uL (ref 150–450)
RBC: 5.15 x10E6/uL (ref 3.77–5.28)
RDW: 15.1 % (ref 11.7–15.4)
WBC: 8.6 10*3/uL (ref 3.4–10.8)

## 2023-11-20 NOTE — Therapy (Signed)
 OUTPATIENT PHYSICAL THERAPY NEURO TREATMENT   Patient Name: Dawn Carlson MRN: [id number] DOB:February 03, 1951, 73 y.o., female Today's Date: 11/24/2023   PCP: de Peru, Alonza Jansky, MD REFERRING PROVIDER: Theadore Finger, MD >follow up with de Peru, Raymond J, MD   END OF SESSION:  PT End of Session - 11/24/23 0847     Visit Number 2    Number of Visits 13    Date for PT Re-Evaluation 01/02/24    Authorization Type [employer]    Authorization Time Period approved 13 PT visits from 11/19/2023 - 01/02/2024    Authorization - Visit Number 2    Authorization - Number of Visits 13    PT Start Time 0804    PT Stop Time 0843    PT Time Calculation (min) 39 min    Equipment Utilized During Treatment Gait belt    Activity Tolerance Patient tolerated treatment well    Behavior During Therapy [facility] for tasks assessed/performed              Past Medical History:  Diagnosis Date   Rash 03/12/2021   Stroke [facility])    Thyroid  disease    Past Surgical History:  Procedure Laterality Date   CARPAL TUNNEL RELEASE     Patient Active Problem List   Diagnosis Date Noted   Primary hypertension 11/19/2023   Acute CVA (cerebrovascular accident) (HCC) 11/11/2023   Cerebellar stroke, acute (HCC) 11/11/2023   Hyperlipidemia 11/11/2023   Wellness examination 04/16/2022   Tick bite of abdominal wall 03/28/2022   Right knee pain 11/14/2021   Class 2 severe obesity due to excess calories with serious comorbidity and body mass index (BMI) of 37.0 to 37.9 in adult (HCC) 05/08/2021   Blood pressure elevated without history of HTN 05/08/2021   Pre-diabetes 05/08/2021   Insulin  resistance 05/08/2021   Acanthosis nigricans 05/08/2021   At high risk for cardiovascular disease 05/08/2021   Encounter for medical examination to establish care 03/12/2021   Leg heaviness 03/12/2021   Familial hyperlipidemia, 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   Tinnitus, bilateral 08/25/2018   Hypothyroidism 04/07/2006   DENTAL  CARIES, SEVERE 04/07/2006   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 Reports that keeping still and her balance is hard. Reports sometimes dizziness occurs while laying still in bed and she get dizzy. Reports that scrolling on her phone is not comfortable. Has not paid attention to if crowded environments make her dizzy.    Pt accompanied by: self  PERTINENT HISTORY: 11/10/2023-11/11/2023 PMH of hypothyroidism, class II obesity and prediabetes presented to [facility] with vertigo, nausea and vomiting, and found to have small focus of acute ischemia within the superior left cerebral vermis and old right cerebellar infarct on MRI.  CT head and CT angio head and neck were unremarkable other than old right cerebral infarct.   PAIN:  Are you having pain? "No"  PRECAUTIONS: Fall  RED FLAGS: None   WEIGHT BEARING RESTRICTIONS: No  FALLS: Has patient fallen in last 6 months? Yes. Number of falls 1 fall on day she went to ED  LIVING ENVIRONMENT: Lives with: lives with their family Lives in: House/apartment Stairs: 2 steps to get into home; 2 flights of 7-8 steps to bedroom with rails Has following equipment at home: None  PLOF: Independent  PATIENT GOALS: To get better balance  OBJECTIVE:      TODAY'S TREATMENT: 11/24/23 Activity Comments  sitting head  turns to targets 30" Self-selected pace 0/10 dizziness   sitting head nods to targets 30" Self-selected pace 2-3/10 dizziness   Sitting VOR cancellation 30" Reports 1/10 dizziness or less   Standing VOR cancellation 30" Cues for quicker pace; 1/10 dizziness   brandt daroff 3x each  Slow; asymptomatic; no nystagmus evident   At quicker pace with PT assist: 2-3/10 upon sitting up B  At moderate pace with EC: no dizziness   romberg EO/EC  2x30" each  Mild-mod sway with both versions   tandem walk along counter  Requires 1 UE support   backwards walk along counter  Good improvement in stability after widening BOS    HOME EXERCISE PROGRAM Last updated: 11/24/23 Access Code: [email or web address] URL: [email or web address] Date: 11/24/2023 Prepared by: [facility] Outpatient  Rehab - [facility]  Program Notes allow symptoms to rise to 3-4/10 dizziness; perform standing exercises near counter for safety  Exercises - Seated Head Nods Vestibular Habituation  - 1 x daily - 5 x weekly - 2-3 sets - 30 sec hold - Brandt-Daroff Vestibular Exercise  - 1 x daily - 5 x weekly - 2 sets - 3-5 reps - Narrow Stance with Counter Support  - 1 x daily - 5 x weekly - 2 sets - 30 sec hold - Romberg Stance with Eyes Closed  - 1 x daily - 5 x weekly - 2 sets - 30 sec hold - Tandem Walking with Counter Support  - 1 x daily - 5 x weekly - 4 sets   PATIENT EDUCATION: Education details: explained basis of habituation and intended level of sx; HEP with edu for safety  Person educated: Patient Education method: Explanation, Demonstration, Tactile cues, Verbal cues, and Handouts Education comprehension: verbalized understanding and returned demonstration     Note: Objective measures were completed at Evaluation unless otherwise noted.  DIAGNOSTIC FINDINGS: see above in pertinent hx section  COGNITION: Overall cognitive status: Within functional limits for tasks assessed    VESTIBULAR  ASSESSMENT   GENERAL OBSERVATION: No acute distress.   Turning quickly brings on dizziness. Quick head movements bring on dizziness.  Feel like I've definitely improved. (Initial ED visit-was being pulled to L  side, falling all over the place and this has improved).  Have had vertigo in the past   OCULOMOTOR EXAM:   Ocular ROM: No Limitations   Spontaneous Nystagmus: absent   Gaze-Induced Nystagmus: absent   Smooth Pursuits: intact   Saccades: intact   Convergence/Divergence: approx 18 cm    VESTIBULAR - OCULAR REFLEX:    Slow VOR: Comment: unable to sustain, feels dizzy, 6/10   VOR Cancellation: Comment: performs slowly, rates 3-4/10 dizziness   Head-Impulse Test: HIT Left: positive       POSITIONAL TESTING: NT due to pt's c/o not in line with positional vertigo today          M-CTSIB  Condition 1: Firm Surface, EO 30 Sec, Mild Sway  Condition 2: Firm Surface, EC 30 Sec, Moderate Sway  Condition 3: Foam Surface, EO 30 Sec, Moderate Sway  Condition 4: Foam Surface, EC 10 Sec, Severe Sway    TRANSFERS: Sit to stand: Modified independence  Assistive device utilized: None     Stand to sit: Modified independence  Assistive device utilized: None       GAIT: Findings: Gait Characteristics: Trendelenburg/increased L lean with gait, step through pattern, decreased step length- Left, decreased stance time- Right, lateral lean- Left, and wide BOS, Distance walked: 50 ft, Assistive device utilized:None, Level of assistance: SBA, and Comments: increased lean to L with gait activities in DGI  FUNCTIONAL TESTS:  10 meter walk test: 16.69 sec (1.97 ft/sec) Dynamic Gait Index: 10/24 *Scores <19/24 indicate increased fall risk    PATIENT SURVEYS:  DHI 38                                                                                                                              TREATMENT DATE: 11/19/2023    PATIENT EDUCATION: Education details: Eval results, POC Person educated:  Patient Education method: Explanation Education comprehension: verbalized understanding  HOME EXERCISE PROGRAM: Not yet initiated  GOALS: Goals reviewed with patient? Yes  SHORT TERM GOALS: Target date: 12/18/2023  Pt will be independent with HEP for improved balance, gait. Baseline: Goal status: IN PROGRESS  2.  Pt will improve DGI score to at least 15/24 to decrease fall risk. Baseline: 10/24 Goal status: IN PROGRESS  LONG TERM GOALS: Target date: 01/02/2024  Pt will be independent with HEP for improved balance, dizziness, gait. Baseline:  Goal status: IN PROGRESS  2.  Pt will improve DGI score to at least 19/24 to decrease fall risk. Baseline: 10/24 Goal status: IN PROGRESS  3.  Pt will improve gait velocity to at least 2.62 ft/sec for improved gait efficiency and safety. Baseline: 1.97 ft/sec Goal status: IN PROGRESS  4.  Pt will perform Condition 4 on MCTSIB 30 sec mod sway or better for improved balance. Baseline: 10 sec severe sway Goal status: IN PROGRESS  5.  Pt will improve DHI score to 20 or less for improved dizziness. Baseline: 38 Goal status:IN PROGRESS  ASSESSMENT:  CLINICAL IMPRESSION: Patient arrived to session with report of unchanged symptoms of imbalance and dizziness. Notes static balance is difficult and describes some optokinetic sensitivity. Initiated exercises for challenge gaze stability and static/dynamic balance. Patient tolerated seated head movements with mild symptoms reported with vertical direction. Bed mobility habituation exercises elicited symptoms at quicker movement patterns today. Balance exercises were performed neat counter for max safety. patient tolerated session well and without complaints upon leaving.   OBJECTIVE IMPAIRMENTS: Abnormal gait, decreased balance, decreased mobility, difficulty walking, and dizziness.   ACTIVITY LIMITATIONS: bending, standing, locomotion level, and caring for others  PARTICIPATION LIMITATIONS:  meal prep, cleaning, laundry, and community activity  PERSONAL FACTORS: 3+ comorbidities: See PMH above are also affecting patient's functional outcome.   REHAB POTENTIAL: Good  CLINICAL DECISION MAKING: Evolving/moderate complexity  EVALUATION COMPLEXITY: Moderate  PLAN:  PT FREQUENCY: 2x/week  PT DURATION: 6 weeks plus eval  PLANNED INTERVENTIONS: 97750- Physical Performance Testing, 97110-Therapeutic exercises, 97530- Therapeutic activity, [id number]- Neuromuscular re-education, 97535- Self Care, 16109- Gait training, Patient/Family education, Balance training, and Vestibular training  PLAN FOR NEXT SESSION: review HEP to address VOR, multi-sensory balance, gait training.  Check motion sensitivity and provide habituation at needed; optokinetic challenges    Thaddeus Filippo, PT, DPT 11/24/23 8:48 AM  [facility] at [facility] [address] [place], [address] [place], [place] [address] Phone # [phone] Fax # [phone]

## 2023-11-24 ENCOUNTER — Ambulatory Visit: Admitting: Physical Therapy

## 2023-11-24 ENCOUNTER — Other Ambulatory Visit (HOSPITAL_BASED_OUTPATIENT_CLINIC_OR_DEPARTMENT_OTHER): Payer: Self-pay

## 2023-11-24 ENCOUNTER — Encounter: Payer: Self-pay | Admitting: Physical Therapy

## 2023-11-24 DIAGNOSIS — R2681 Unsteadiness on feet: Secondary | ICD-10-CM | POA: Diagnosis not present

## 2023-11-24 DIAGNOSIS — R2689 Other abnormalities of gait and mobility: Secondary | ICD-10-CM

## 2023-11-24 DIAGNOSIS — R42 Dizziness and giddiness: Secondary | ICD-10-CM

## 2023-11-24 DIAGNOSIS — I63342 Cerebral infarction due to thrombosis of left cerebellar artery: Secondary | ICD-10-CM | POA: Diagnosis not present

## 2023-11-25 ENCOUNTER — Ambulatory Visit (HOSPITAL_BASED_OUTPATIENT_CLINIC_OR_DEPARTMENT_OTHER): Payer: Self-pay | Admitting: Family Medicine

## 2023-11-25 ENCOUNTER — Other Ambulatory Visit (HOSPITAL_BASED_OUTPATIENT_CLINIC_OR_DEPARTMENT_OTHER): Payer: Self-pay | Admitting: *Deleted

## 2023-11-25 DIAGNOSIS — Z0000 Encounter for general adult medical examination without abnormal findings: Secondary | ICD-10-CM

## 2023-11-25 NOTE — Therapy (Signed)
 OUTPATIENT PHYSICAL THERAPY NEURO TREATMENT   Patient Name: Dawn Carlson MRN: [id number] DOB:06/25/1950, 73 y.o., female Today's Date: 11/26/2023   PCP: de [place], Alonza Jansky, MD REFERRING PROVIDER: Theadore Finger, MD >follow up with de [place], Raymond J, MD   END OF SESSION:  PT End of Session - 11/26/23 0839     Visit Number 3    Number of Visits 13    Date for PT Re-Evaluation 01/02/24    Authorization Type [employer]    Authorization Time Period approved 13 PT visits from 11/19/2023 - 01/02/2024    Authorization - Visit Number 3    Authorization - Number of Visits 13    PT Start Time 0802    PT Stop Time 0843    PT Time Calculation (min) 41 min    Equipment Utilized During Treatment Gait belt    Activity Tolerance Patient tolerated treatment well    Behavior During Therapy [facility] for tasks assessed/performed               Past Medical History:  Diagnosis Date   Rash 03/12/2021   Stroke [facility])    Thyroid  disease    Past Surgical History:  Procedure Laterality Date   CARPAL TUNNEL RELEASE     Patient Active Problem List   Diagnosis Date Noted   Primary hypertension 11/19/2023   Acute CVA (cerebrovascular accident) (HCC) 11/11/2023   Cerebellar stroke, acute (HCC) 11/11/2023   Hyperlipidemia 11/11/2023   Wellness examination 04/16/2022   Tick bite of abdominal wall 03/28/2022   Right knee pain 11/14/2021   Class 2 severe obesity due to excess calories with serious comorbidity and body mass index (BMI) of 37.0 to 37.9 in adult (HCC) 05/08/2021   Blood pressure elevated without history of HTN 05/08/2021   Pre-diabetes 05/08/2021   Insulin  resistance 05/08/2021   Acanthosis nigricans 05/08/2021   At high risk for cardiovascular disease 05/08/2021   Encounter for medical examination to establish care 03/12/2021   Leg heaviness 03/12/2021   Familial hyperlipidemia, 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   Tinnitus, bilateral 08/25/2018   Hypothyroidism 04/07/2006   DENTAL  CARIES, SEVERE 04/07/2006   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/2006   CARPAL TUNNEL RELEASE, HX OF 04/07/2006    ONSET DATE: 11/11/2023  REFERRING DIAG: D63.875 (ICD-10-CM) - 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 Reports that she has had some anxiety since her stroke. Reports that she is practicing HEP. Reports dizziness usually occurs when she is sitting watching tv.   Pt accompanied by: self  PERTINENT HISTORY: 11/10/2023-11/11/2023 PMH of hypothyroidism, class II obesity and prediabetes presented to [facility] with vertigo, nausea and vomiting, and found to have small focus of acute ischemia within the superior left cerebral vermis and old right cerebellar infarct on MRI.  CT head and CT angio head and neck were unremarkable other than old right cerebral infarct.   PAIN:  Are you having pain? No, stiff neck from trying to exercise  PRECAUTIONS: Fall  RED FLAGS: None   WEIGHT BEARING RESTRICTIONS: No  FALLS: Has patient fallen in last 6 months? Yes. Number of falls 1 fall on day she went to ED  LIVING ENVIRONMENT: Lives with: lives with their family Lives in: House/apartment Stairs: 2 steps to get into home; 2 flights of 7-8 steps to bedroom with rails Has following equipment at home: None  PLOF: Independent  PATIENT GOALS: To get better balance  OBJECTIVE:     TODAY'S TREATMENT: 11/26/23 Activity Comments  review of HEP: sitting head nods to targets 30 brandt daroff 3x each romberg EO/EC  30 tandem walk No dizziness with BD- encouraged quicker pace at home.1/10 dizziness with head nods.mild-mod instability with tandem walk. Cues for core and hip contraction in romberg    walking EC along TM Lateral lean; good stability   alt toe tap on cone Single and double tap; good stability by end of exercise  standing on foam andt/pos and R/L wt shifts Cueing for max excursion without LOB  standing on foam EC 30 Good stability   side stepping over cones Required 1 UE support; L>R ankle instability       PATIENT EDUCATION: Education details: encouraged her to speak to PCP about anxiety if it persists; HEP update Person educated: Patient Education method: Explanation, Demonstration, Tactile cues, Verbal cues, and Handouts Education comprehension: verbalized understanding and returned demonstration   HOME EXERCISE PROGRAM Access Code: [email or web address] URL: [email or web address] Date: 11/26/2023 Prepared by: [facility] - Outpatient  Rehab - [facility]  Program Notes allow symptoms to rise to 3-4/10 dizziness; perform standing exercises near counter for safety  Exercises - Seated Head Nods Vestibular Habituation  - 1 x daily - 5 x weekly - 2-3 sets - 30 sec hold - Brandt-Daroff Vestibular Exercise  - 1 x daily - 5 x weekly - 2 sets - 3-5 reps - Narrow Stance with Counter Support  - 1 x daily - 5 x weekly - 2 sets - 30 sec hold - Romberg Stance with Eyes Closed  - 1 x daily - 5 x weekly - 2 sets - 30 sec hold - Tandem Walking with Counter Support  - 1 x daily - 5 x weekly - 4 sets - Side Step Overs with Cones and Unilateral Counter Support  - 1 x daily - 5 x weekly - 2 sets - 5 reps     Note: Objective measures were completed at Evaluation unless otherwise noted.  DIAGNOSTIC FINDINGS: see above in pertinent hx section  COGNITION: Overall cognitive status: Within functional limits for tasks assessed    VESTIBULAR ASSESSMENT   GENERAL OBSERVATION: No acute  distress.   Turning quickly brings on dizziness. Quick head movements bring on dizziness.  Feel like I've definitely improved. (Initial ED visit-was being pulled to L  side, falling all over the place and this has improved).  Have had vertigo in the past   OCULOMOTOR EXAM:   Ocular ROM: No Limitations   Spontaneous Nystagmus: absent   Gaze-Induced Nystagmus: absent   Smooth Pursuits: intact   Saccades: intact   Convergence/Divergence: approx 18 cm    VESTIBULAR - OCULAR REFLEX:    Slow VOR: Comment: unable to sustain, feels dizzy, 6/10   VOR Cancellation: Comment: performs slowly, rates 3-4/10 dizziness   Head-Impulse Test: HIT Left: positive       POSITIONAL TESTING: NT due to pt's c/o not in line with positional vertigo today          M-CTSIB  Condition 1: Firm Surface, EO 30 Sec, Mild Sway  Condition 2: Firm Surface, EC 30 Sec, Moderate Sway  Condition 3: Foam Surface, EO 30 Sec, Moderate Sway  Condition 4: Foam Surface, EC 10 Sec, Severe Sway    TRANSFERS: Sit to stand: Modified independence  Assistive device utilized: None     Stand to sit: Modified independence  Assistive device utilized: None       GAIT: Findings: Gait Characteristics: Trendelenburg/increased L lean with gait, step through pattern, decreased step length- Left, decreased stance time- Right, lateral lean- Left, and wide BOS, Distance walked: 50 ft, Assistive device utilized:None, Level of assistance: SBA, and Comments: increased lean to L with gait activities in DGI  FUNCTIONAL TESTS:  10 meter walk test: 16.69 sec (1.97 ft/sec) Dynamic Gait Index: 10/24 *Scores <19/24 indicate increased fall risk    PATIENT SURVEYS:  DHI 38                                                                                                                              TREATMENT DATE: 11/19/2023    PATIENT EDUCATION: Education details: Eval results, POC Person educated: Patient Education method:  Explanation Education comprehension: verbalized understanding  HOME EXERCISE PROGRAM: Not yet initiated  GOALS: Goals reviewed with patient? Yes  SHORT TERM GOALS: Target date: 12/18/2023  Pt will be independent with HEP for improved balance, gait. Baseline: Goal status: IN PROGRESS  2.  Pt will improve DGI score to at least 15/24 to decrease fall risk. Baseline: 10/24 Goal status: IN PROGRESS  LONG TERM GOALS: Target date: 01/02/2024  Pt will be independent with HEP for improved balance, dizziness, gait. Baseline:  Goal status: IN PROGRESS  2.  Pt will improve DGI score to at least 19/24 to decrease fall risk. Baseline: 10/24 Goal status: IN PROGRESS  3.  Pt will improve gait velocity to at least 2.62 ft/sec for improved gait efficiency and safety. Baseline: 1.97 ft/sec Goal status: IN PROGRESS  4.  Pt will perform Condition 4 on MCTSIB 30 sec mod sway or better for improved balance. Baseline: 10 sec severe sway Goal status: IN PROGRESS  5.  Pt will improve DHI score to 20 or less for improved dizziness. Baseline: 38 Goal status:IN PROGRESS  ASSESSMENT:  CLINICAL IMPRESSION: Patient arrived to session with report of difficulty sleeping and anxiety since her CVA. Reviewed HEP which revealed improved tolerance for habituation. Remaining imbalance evident with balance activities- patient demonstrated good response to cuing. Initiated compliant surface training and SLS tasks which were performed with fairly good stability. Most imbalance evident with stepping over obstacles. Patient tolerated session well and without complaints upon leaving.   OBJECTIVE IMPAIRMENTS: Abnormal gait, decreased balance, decreased mobility, difficulty walking, and dizziness.   ACTIVITY LIMITATIONS: bending, standing, locomotion level, and caring for others  PARTICIPATION LIMITATIONS: meal prep, cleaning, laundry, and community activity  PERSONAL FACTORS: 3+ comorbidities: See PMH above are  also affecting patient's functional outcome.   REHAB POTENTIAL: Good  CLINICAL DECISION MAKING: Evolving/moderate complexity  EVALUATION COMPLEXITY: Moderate  PLAN:  PT FREQUENCY: 2x/week  PT DURATION: 6 weeks plus eval  PLANNED INTERVENTIONS: 97750- Physical Performance Testing, 97110-Therapeutic exercises, 97530- Therapeutic activity, [id number]- Neuromuscular re-education, 97535- Self Care, 16109- Gait training, Patient/Family education, Balance training, and Vestibular training  PLAN FOR NEXT SESSION: review HEP to address VOR, multi-sensory balance, gait training.  Check motion sensitivity and provide habituation at needed; optokinetic challenges    Thaddeus Filippo, PT, DPT 11/26/23 8:43 AM  [facility] Outpatient Rehab at [facility] [address] [place], [address] [place], [place] [address] Phone # [phone] Fax # [phone]

## 2023-11-26 ENCOUNTER — Other Ambulatory Visit (HOSPITAL_COMMUNITY): Payer: Self-pay | Admitting: Family Medicine

## 2023-11-26 ENCOUNTER — Ambulatory Visit: Admitting: Physical Therapy

## 2023-11-26 ENCOUNTER — Encounter: Payer: Self-pay | Admitting: Physical Therapy

## 2023-11-26 ENCOUNTER — Encounter (HOSPITAL_BASED_OUTPATIENT_CLINIC_OR_DEPARTMENT_OTHER): Payer: Self-pay

## 2023-11-26 ENCOUNTER — Other Ambulatory Visit (HOSPITAL_BASED_OUTPATIENT_CLINIC_OR_DEPARTMENT_OTHER)

## 2023-11-26 DIAGNOSIS — R42 Dizziness and giddiness: Secondary | ICD-10-CM | POA: Diagnosis not present

## 2023-11-26 DIAGNOSIS — I63342 Cerebral infarction due to thrombosis of left cerebellar artery: Secondary | ICD-10-CM | POA: Diagnosis not present

## 2023-11-26 DIAGNOSIS — Z0000 Encounter for general adult medical examination without abnormal findings: Secondary | ICD-10-CM | POA: Diagnosis not present

## 2023-11-26 DIAGNOSIS — R2681 Unsteadiness on feet: Secondary | ICD-10-CM | POA: Diagnosis not present

## 2023-11-26 DIAGNOSIS — R2689 Other abnormalities of gait and mobility: Secondary | ICD-10-CM | POA: Diagnosis not present

## 2023-11-26 LAB — CMP14+EGFR
ALT: 15 IU/L (ref 0–32)
AST: 20 IU/L (ref 0–40)
Albumin: 4.2 g/dL (ref 3.9–4.9)
Alkaline Phosphatase: 106 IU/L (ref 44–121)
BUN/Creatinine Ratio: 8 — ABNORMAL LOW (ref 12–28)
BUN: 8 mg/dL (ref 8–27)
Bilirubin Total: 0.4 mg/dL (ref 0.0–1.2)
CO2: 19 mmol/L — ABNORMAL LOW (ref 20–29)
Calcium: 9.1 mg/dL (ref 8.7–10.3)
Chloride: 106 mmol/L (ref 96–106)
Creatinine, Ser: 1.02 mg/dL — ABNORMAL HIGH (ref 0.57–1.00)
Globulin, Total: 3 g/dL (ref 1.5–4.5)
Glucose: 102 mg/dL — ABNORMAL HIGH (ref 70–99)
Potassium: 4.2 mmol/L (ref 3.5–5.2)
Sodium: 140 mmol/L (ref 134–144)
Total Protein: 7.2 g/dL (ref 6.0–8.5)
eGFR: 59 mL/min/{1.73_m2} — ABNORMAL LOW (ref >59)

## 2023-11-27 ENCOUNTER — Inpatient Hospital Stay (HOSPITAL_BASED_OUTPATIENT_CLINIC_OR_DEPARTMENT_OTHER): Admitting: Family Medicine

## 2023-11-28 NOTE — Therapy (Incomplete)
 OUTPATIENT PHYSICAL THERAPY NEURO TREATMENT   Patient Name: Dawn Carlson MRN: [id number] DOB:22-Sep-1950, 73 y.o., female Today's Date: 11/28/2023   PCP: de [place], Alonza Jansky, MD REFERRING PROVIDER: Theadore Finger, MD >follow up with de [place], Raymond J, MD   END OF SESSION:      Past Medical History:  Diagnosis Date   Rash 03/12/2021   Stroke [facility])    Thyroid  disease    Past Surgical History:  Procedure Laterality Date   CARPAL TUNNEL RELEASE     Patient Active Problem List   Diagnosis Date Noted   Primary hypertension 11/19/2023   Acute CVA (cerebrovascular accident) (HCC) 11/11/2023   Cerebellar stroke, acute (HCC) 11/11/2023   Hyperlipidemia 11/11/2023   Wellness examination 04/16/2022   Tick bite of abdominal wall 03/28/2022   Right knee pain 11/14/2021   Class 2 severe obesity due to excess calories with serious comorbidity and body mass index (BMI) of 37.0 to 37.9 in adult (HCC) 05/08/2021   Blood pressure elevated without history of HTN 05/08/2021   Pre-diabetes 05/08/2021   Insulin  resistance 05/08/2021   Acanthosis nigricans 05/08/2021   At high risk for cardiovascular disease 05/08/2021   Encounter for medical examination to establish care 03/12/2021   Leg heaviness 03/12/2021   Familial hyperlipidemia, 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   Tinnitus, bilateral 08/25/2018   Hypothyroidism 04/07/2006   DENTAL CARIES, SEVERE 04/07/2006   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/2006   CARPAL TUNNEL RELEASE, HX OF 04/07/2006    ONSET DATE: 11/11/2023  REFERRING DIAG: A21.308 (ICD-10-CM) - 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  Rationale for Evaluation and Treatment: Rehabilitation  SUBJECTIVE:                                                                                                                                                                                              SUBJECTIVE STATEMENT: Tired from not getting much sleep today. Reports that she has had some anxiety since her stroke. Reports that she is practicing HEP. Reports dizziness usually occurs when she is sitting watching tv.   Pt accompanied by: self  PERTINENT HISTORY: 11/10/2023-11/11/2023 PMH of hypothyroidism, class II obesity and prediabetes presented to [facility] with vertigo, nausea and vomiting, and found to have small focus of acute ischemia within the superior left cerebral vermis and old right cerebellar infarct on MRI.  CT head and CT angio head and neck were unremarkable other than old right cerebral infarct.   PAIN:  Are you having pain? No, stiff neck from trying to exercise  PRECAUTIONS: Fall  RED FLAGS: None   WEIGHT BEARING RESTRICTIONS: No  FALLS: Has patient fallen in last 6 months? Yes. Number of falls 1 fall on day she went to ED  LIVING ENVIRONMENT: Lives with: lives with their family Lives in: House/apartment Stairs: 2 steps to get into home; 2 flights of 7-8 steps to bedroom with rails Has following equipment at home: None  PLOF: Independent  PATIENT GOALS: To get better balance  OBJECTIVE:     TODAY'S TREATMENT: 12/01/23 Activity Comments                        TODAY'S TREATMENT: 11/26/23 Activity Comments  review of HEP: sitting head nods to targets 30 brandt daroff 3x each romberg EO/EC 30 tandem walk No dizziness with BD- encouraged quicker pace at home.1/10 dizziness with head nods.mild-mod instability with tandem walk. Cues for core and hip contraction in romberg    walking EC along TM Lateral lean; good stability   alt toe tap on cone Single and double tap; good stability by end of exercise  standing on foam andt/pos and R/L wt shifts Cueing for max excursion without LOB  standing on foam EC 30 Good stability   side stepping over cones Required 1 UE support; L>R ankle instability       PATIENT EDUCATION: Education  details: encouraged her to speak to PCP about anxiety if it persists; HEP update Person educated: Patient Education method: Explanation, Demonstration, Tactile cues, Verbal cues, and Handouts Education comprehension: verbalized understanding and returned demonstration   HOME EXERCISE PROGRAM Access Code: [email or web address] URL: [email or web address] Date: 11/26/2023 Prepared by: [facility] - Outpatient  Rehab - [facility]  Program Notes allow symptoms to rise to 3-4/10 dizziness; perform standing exercises near counter for safety  Exercises - Seated Head Nods Vestibular Habituation  - 1 x daily - 5 x weekly - 2-3 sets - 30 sec hold - Brandt-Daroff Vestibular Exercise  - 1 x daily - 5 x weekly - 2 sets - 3-5 reps - Narrow Stance with Counter Support  - 1 x daily - 5 x weekly - 2 sets - 30 sec hold - Romberg Stance with Eyes Closed  - 1 x daily - 5 x weekly - 2 sets - 30 sec hold - Tandem Walking with Counter Support  - 1 x daily - 5 x weekly - 4 sets - Side Step Overs with Cones and Unilateral Counter Support  - 1 x daily - 5 x weekly - 2 sets - 5 reps     Note: Objective measures were completed at Evaluation unless otherwise noted.  DIAGNOSTIC FINDINGS: see above in pertinent hx section  COGNITION: Overall cognitive status: Within functional limits for tasks assessed    VESTIBULAR ASSESSMENT   GENERAL OBSERVATION: No acute distress.   Turning quickly brings on dizziness. Quick head movements bring on dizziness.  Feel like I've definitely improved. (Initial ED visit-was being pulled to L side, falling all over the place and this has improved).  Have had vertigo in the past   OCULOMOTOR EXAM:   Ocular ROM: No Limitations   Spontaneous Nystagmus: absent   Gaze-Induced Nystagmus: absent   Smooth Pursuits: intact   Saccades: intact   Convergence/Divergence: approx 18 cm    VESTIBULAR - OCULAR REFLEX:    Slow VOR: Comment: unable to sustain, feels dizzy, 6/10   VOR  Cancellation: Comment: performs slowly, rates 3-4/10 dizziness   Head-Impulse Test: HIT Left: positive  POSITIONAL TESTING: NT due to pt's c/o not in line with positional vertigo today          M-CTSIB  Condition 1: Firm Surface, EO 30 Sec, Mild Sway  Condition 2: Firm Surface, EC 30 Sec, Moderate Sway  Condition 3: Foam Surface, EO 30 Sec, Moderate Sway  Condition 4: Foam Surface, EC 10 Sec, Severe Sway    TRANSFERS: Sit to stand: Modified independence  Assistive device utilized: None     Stand to sit: Modified independence  Assistive device utilized: None       GAIT: Findings: Gait Characteristics: Trendelenburg/increased L lean with gait, step through pattern, decreased step length- Left, decreased stance time- Right, lateral lean- Left, and wide BOS, Distance walked: 50 ft, Assistive device utilized:None, Level of assistance: SBA, and Comments: increased lean to L with gait activities in DGI  FUNCTIONAL TESTS:  10 meter walk test: 16.69 sec (1.97 ft/sec) Dynamic Gait Index: 10/24 *Scores <19/24 indicate increased fall risk    PATIENT SURVEYS:  DHI 38                                                                                                                              TREATMENT DATE: 11/19/2023    PATIENT EDUCATION: Education details: Eval results, POC Person educated: Patient Education method: Explanation Education comprehension: verbalized understanding  HOME EXERCISE PROGRAM: Not yet initiated  GOALS: Goals reviewed with patient? Yes  SHORT TERM GOALS: Target date: 12/18/2023  Pt will be independent with HEP for improved balance, gait. Baseline: Goal status: IN PROGRESS  2.  Pt will improve DGI score to at least 15/24 to decrease fall risk. Baseline: 10/24 Goal status: IN PROGRESS  LONG TERM GOALS: Target date: 01/02/2024  Pt will be independent with HEP for improved balance, dizziness, gait. Baseline:  Goal status: IN PROGRESS  2.  Pt  will improve DGI score to at least 19/24 to decrease fall risk. Baseline: 10/24 Goal status: IN PROGRESS  3.  Pt will improve gait velocity to at least 2.62 ft/sec for improved gait efficiency and safety. Baseline: 1.97 ft/sec Goal status: IN PROGRESS  4.  Pt will perform Condition 4 on MCTSIB 30 sec mod sway or better for improved balance. Baseline: 10 sec severe sway Goal status: IN PROGRESS  5.  Pt will improve DHI score to 20 or less for improved dizziness. Baseline: 38 Goal status:IN PROGRESS   ASSESSMENT:  CLINICAL IMPRESSION: Patient arrived to session with report of difficulty sleeping and anxiety since her CVA. Reviewed HEP which revealed improved tolerance for habituation. Remaining imbalance evident with balance activities- patient demonstrated good response to cuing. Initiated compliant surface training and SLS tasks which were performed with fairly good stability. Most imbalance evident with stepping over obstacles. Patient tolerated session well and without complaints upon leaving.   OBJECTIVE IMPAIRMENTS: Abnormal gait, decreased balance, decreased mobility, difficulty walking, and dizziness.   ACTIVITY LIMITATIONS: bending, standing, locomotion level, and caring for  others  PARTICIPATION LIMITATIONS: meal prep, cleaning, laundry, and community activity  PERSONAL FACTORS: 3+ comorbidities: See PMH above are also affecting patient's functional outcome.   REHAB POTENTIAL: Good  CLINICAL DECISION MAKING: Evolving/moderate complexity  EVALUATION COMPLEXITY: Moderate  PLAN:  PT FREQUENCY: 2x/week  PT DURATION: 6 weeks plus eval  PLANNED INTERVENTIONS: 97750- Physical Performance Testing, 97110-Therapeutic exercises, 97530- Therapeutic activity, [id number]- Neuromuscular re-education, 97535- Self Care, 40981- Gait training, Patient/Family education, Balance training, and Vestibular training  PLAN FOR NEXT SESSION: review HEP to address VOR, multi-sensory balance, gait  training.  Check motion sensitivity and provide habituation at needed; optokinetic challenges    Thaddeus Filippo, PT, DPT 11/28/23 7:36 AM  [facility] Outpatient Rehab at [facility] [address] [place], [address] [place], [place] [address] Phone # [phone] Fax # [phone]

## 2023-12-01 ENCOUNTER — Ambulatory Visit: Admitting: Physical Therapy

## 2023-12-02 ENCOUNTER — Ambulatory Visit (HOSPITAL_BASED_OUTPATIENT_CLINIC_OR_DEPARTMENT_OTHER): Payer: Self-pay | Admitting: Family Medicine

## 2023-12-02 NOTE — Therapy (Signed)
 OUTPATIENT PHYSICAL THERAPY NEURO TREATMENT   Patient Name: Dawn Carlson MRN: [id number] DOB:08-Feb-1951, 73 y.o., female Today's Date: 12/03/2023   PCP: de [place], Alonza Jansky, MD REFERRING PROVIDER: Theadore Finger, MD >follow up with de Peru, Raymond J, MD   END OF SESSION:  PT End of Session - 12/03/23 0844     Visit Number 4    Number of Visits 13    Date for PT Re-Evaluation 01/02/24    Authorization Type [employer]    Authorization Time Period approved 13 PT visits from 11/19/2023 - 01/02/2024    Authorization - Visit Number 4    Authorization - Number of Visits 13    PT Start Time 0803    PT Stop Time 0843    PT Time Calculation (min) 40 min    Equipment Utilized During Treatment Gait belt    Activity Tolerance Patient tolerated treatment well    Behavior During Therapy [facility] for tasks assessed/performed             Past Medical History:  Diagnosis Date   Rash 03/12/2021   Stroke [facility])    Thyroid  disease    Past Surgical History:  Procedure Laterality Date   CARPAL TUNNEL RELEASE     Patient Active Problem List   Diagnosis Date Noted   Primary hypertension 11/19/2023   Acute CVA (cerebrovascular accident) (HCC) 11/11/2023   Cerebellar stroke, acute (HCC) 11/11/2023   Hyperlipidemia 11/11/2023   Wellness examination 04/16/2022   Tick bite of abdominal wall 03/28/2022   Right knee pain 11/14/2021   Class 2 severe obesity due to excess calories with serious comorbidity and body mass index (BMI) of 37.0 to 37.9 in adult (HCC) 05/08/2021   Blood pressure elevated without history of HTN 05/08/2021   Pre-diabetes 05/08/2021   Insulin  resistance 05/08/2021   Acanthosis nigricans 05/08/2021   At high risk for cardiovascular disease 05/08/2021   Encounter for medical examination to establish care 03/12/2021   Leg heaviness 03/12/2021   Familial hyperlipidemia, 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   Tinnitus, bilateral 08/25/2018   Hypothyroidism 04/07/2006   DENTAL  CARIES, SEVERE 04/07/2006   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/2006   CARPAL TUNNEL RELEASE, HX OF 04/07/2006    ONSET DATE: 11/11/2023  REFERRING DIAG: W09.811 (ICD-10-CM) - 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 Reports that she is caring for her 2 older brothers and that comes with a lot of stress. Reports that she is getting at least 5 hours of sleep- I think it's getting better.  Pt accompanied by: self  PERTINENT HISTORY: 11/10/2023-11/11/2023 PMH of hypothyroidism, class II obesity and prediabetes presented to [facility] with vertigo, nausea and vomiting, and found to have small focus of acute ischemia within the superior left cerebral vermis and old right cerebellar infarct on MRI.  CT head and CT angio head and neck were unremarkable other than old right cerebral infarct.   PAIN:  Are you having pain? No  PRECAUTIONS: Fall  RED FLAGS: None   WEIGHT BEARING RESTRICTIONS: No  FALLS: Has patient fallen in last 6 months? Yes. Number of falls 1 fall on day she went to ED  LIVING ENVIRONMENT: Lives with: lives with their family Lives in: House/apartment Stairs: 2 steps to get into home; 2 flights of 7-8 steps to bedroom with rails Has following equipment at home: None  PLOF: Independent  PATIENT GOALS: To get better balance  OBJECTIVE:     TODAY'S TREATMENT: 12/03/23 Activity Comments  standing VOR vertical and horizontal in front of blinds 30 No dizziness    standing VOR cancellation on foam 2x30 No dizziness; c/o some instability on foam   gait + vertical and horizontal ball toss with visual tracking Practiced ball toss in place first; difficulty maintaining continuity of stepping with vertical ball toss but no dizziness   grapevine walk  CGA and verbal/manual cueing; pt initially following pattern but this improved with practice   side stepping over cones  Less stability on R LE        HOME EXERCISE PROGRAM Access Code: [email or web address] URL: [email or web address] Date: 12/03/2023 Prepared by: [facility] - Outpatient  Rehab - [facility]  Program Notes allow symptoms to rise to 3-4/10 dizziness; perform standing exercises near counter for safety  Exercises - Romberg Stance with Eyes Closed  - 1 x daily - 5 x weekly - 2 sets - 30 sec hold - Tandem Walking with Counter Support  - 1 x daily - 5 x weekly - 4 sets - Side Step Overs with Cones and Unilateral Counter Support  - 1 x daily - 5 x weekly - 2 sets - 5 reps - Celeste Cola Toss with Eye Tracking While Walking  - 1 x daily - 5 x weekly - 2 sets - 5 reps     PATIENT EDUCATION: Education details: HEP update with edu for safety; discussed resuming walking program on more level surfaces for now and suggested grocery store, mall, or school track  Person educated: Patient Education method: Explanation, Demonstration, Tactile cues, Verbal cues, and Handouts Education comprehension: verbalized understanding and returned demonstration    Note: Objective measures were completed at Evaluation unless otherwise noted.  DIAGNOSTIC FINDINGS: see above in pertinent hx section  COGNITION: Overall cognitive status: Within functional limits for tasks assessed    VESTIBULAR ASSESSMENT   GENERAL OBSERVATION: No acute distress.   Turning quickly brings on dizziness. Quick head movements bring on dizziness.  Feel like I've definitely improved. (Initial ED visit-was being pulled to L side,  falling all over the place and this has improved).  Have had vertigo in the past   OCULOMOTOR EXAM:   Ocular ROM: No Limitations   Spontaneous Nystagmus: absent   Gaze-Induced Nystagmus: absent  Smooth Pursuits: intact   Saccades: intact   Convergence/Divergence: approx 18 cm    VESTIBULAR - OCULAR REFLEX:    Slow VOR: Comment: unable to sustain, feels dizzy, 6/10   VOR Cancellation: Comment: performs slowly, rates 3-4/10 dizziness   Head-Impulse Test: HIT Left: positive       POSITIONAL TESTING: NT due to pt's c/o not in line with positional vertigo today          M-CTSIB  Condition 1: Firm Surface, EO 30 Sec, Mild Sway  Condition 2: Firm Surface, EC 30 Sec, Moderate Sway  Condition 3: Foam Surface, EO 30 Sec, Moderate Sway  Condition 4: Foam Surface, EC 10 Sec, Severe Sway    TRANSFERS: Sit to stand: Modified independence  Assistive device utilized: None     Stand to sit: Modified independence  Assistive device utilized: None       GAIT: Findings: Gait Characteristics: Trendelenburg/increased L lean with gait, step through pattern, decreased step length- Left, decreased stance time- Right, lateral lean- Left, and wide BOS, Distance walked: 50 ft, Assistive device utilized:None, Level of assistance: SBA, and Comments: increased lean to L with gait activities in DGI  FUNCTIONAL TESTS:  10 meter walk test: 16.69 sec (1.97 ft/sec) Dynamic Gait Index: 10/24 *Scores <19/24 indicate increased fall risk    PATIENT SURVEYS:  DHI 38                                                                                                                              TREATMENT DATE: 11/19/2023    PATIENT EDUCATION: Education details: Eval results, POC Person educated: Patient Education method: Explanation Education comprehension: verbalized understanding  HOME EXERCISE PROGRAM: Not yet initiated  GOALS: Goals reviewed with patient? Yes  SHORT TERM GOALS: Target date:  12/18/2023  Pt will be independent with HEP for improved balance, gait. Baseline: Goal status: MET 12/03/23  2.  Pt will improve DGI score to at least 15/24 to decrease fall risk. Baseline: 10/24 Goal status: IN PROGRESS  LONG TERM GOALS: Target date: 01/02/2024  Pt will be independent with HEP for improved balance, dizziness, gait. Baseline:  Goal status: IN PROGRESS  2.  Pt will improve DGI score to at least 19/24 to decrease fall risk. Baseline: 10/24 Goal status: IN PROGRESS  3.  Pt will improve gait velocity to at least 2.62 ft/sec for improved gait efficiency and safety. Baseline: 1.97 ft/sec Goal status: IN PROGRESS  4.  Pt will perform Condition 4 on MCTSIB 30 sec mod sway or better for improved balance. Baseline: 10 sec severe sway Goal status: IN PROGRESS  5.  Pt will improve DHI score to 20 or less for improved dizziness. Baseline: 38 Goal status:IN PROGRESS   ASSESSMENT:  CLINICAL IMPRESSION: Patient arrived to session with report of improvement in dizziness. Trialed activities involving optokinetic challenges/visual motion challenges. Patient denies dizziness. Dynamic balance activities revealed most difficulty with gait with vertical ball toss and sidestepping  over obstacles with R LE stabilizing. HEP was consolidated and updated for max benefit. Patient reported understanding and without complaints upon leaving.   OBJECTIVE IMPAIRMENTS: Abnormal gait, decreased balance, decreased mobility, difficulty walking, and dizziness.   ACTIVITY LIMITATIONS: bending, standing, locomotion level, and caring for others  PARTICIPATION LIMITATIONS: meal prep, cleaning, laundry, and community activity  PERSONAL FACTORS: 3+ comorbidities: See PMH above are also affecting patient's functional outcome.   REHAB POTENTIAL: Good  CLINICAL DECISION MAKING: Evolving/moderate complexity  EVALUATION COMPLEXITY: Moderate  PLAN:  PT FREQUENCY: 2x/week  PT DURATION: 6 weeks plus  eval  PLANNED INTERVENTIONS: 97750- Physical Performance Testing, 97110-Therapeutic exercises, 97530- Therapeutic activity, [id number]- Neuromuscular re-education, 97535- Self Care, 29562- Gait training, Patient/Family education, Balance training, and Vestibular training  PLAN FOR NEXT SESSION: review HEP to address VOR, multi-sensory balance, gait training.  Check motion sensitivity and provide habituation at needed; optokinetic challenges    Thaddeus Filippo, PT, DPT 12/03/23 8:45 AM  [facility] Re[facility] at [facility] [address] [place], [address] [place], [place] [address] Phone # [phone] Fax # [phone]

## 2023-12-03 ENCOUNTER — Encounter: Payer: Self-pay | Admitting: Physical Therapy

## 2023-12-03 ENCOUNTER — Ambulatory Visit: Admitting: Physical Therapy

## 2023-12-03 DIAGNOSIS — R2681 Unsteadiness on feet: Secondary | ICD-10-CM

## 2023-12-03 DIAGNOSIS — I63342 Cerebral infarction due to thrombosis of left cerebellar artery: Secondary | ICD-10-CM | POA: Diagnosis not present

## 2023-12-03 DIAGNOSIS — R2689 Other abnormalities of gait and mobility: Secondary | ICD-10-CM | POA: Diagnosis not present

## 2023-12-03 DIAGNOSIS — R42 Dizziness and giddiness: Secondary | ICD-10-CM

## 2023-12-05 ENCOUNTER — Ambulatory Visit: Admitting: Family Medicine

## 2023-12-05 NOTE — Therapy (Incomplete)
 OUTPATIENT PHYSICAL THERAPY NEURO TREATMENT   Patient Name: Dawn Carlson MRN: [id number] DOB:1950/10/23, 73 y.o., female Today's Date: 12/05/2023   PCP: de [place], Alonza Jansky, MD REFERRING PROVIDER: Theadore Finger, MD >follow up with de [place], Raymond J, MD   END OF SESSION:       Past Medical History:  Diagnosis Date   Rash 03/12/2021   Stroke [facility])    Thyroid  disease    Past Surgical History:  Procedure Laterality Date   CARPAL TUNNEL RELEASE     Patient Active Problem List   Diagnosis Date Noted   Primary hypertension 11/19/2023   Acute CVA (cerebrovascular accident) (HCC) 11/11/2023   Cerebellar stroke, acute (HCC) 11/11/2023   Hyperlipidemia 11/11/2023   Wellness examination 04/16/2022   Tick bite of abdominal wall 03/28/2022   Right knee pain 11/14/2021   Class 2 severe obesity due to excess calories with serious comorbidity and body mass index (BMI) of 37.0 to 37.9 in adult (HCC) 05/08/2021   Blood pressure elevated without history of HTN 05/08/2021   Pre-diabetes 05/08/2021   Insulin  resistance 05/08/2021   Acanthosis nigricans 05/08/2021   At high risk for cardiovascular disease 05/08/2021   Encounter for medical examination to establish care 03/12/2021   Leg heaviness 03/12/2021   Familial hyperlipidemia, 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   Tinnitus, bilateral 08/25/2018   Hypothyroidism 04/07/2006   DENTAL CARIES, SEVERE 04/07/2006   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/2006   CARPAL TUNNEL RELEASE, HX OF 04/07/2006    ONSET DATE: 11/11/2023  REFERRING DIAG: W09.811 (ICD-10-CM) - 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  Rationale for Evaluation and Treatment: Rehabilitation  SUBJECTIVE:                                                                                                                                                                                              SUBJECTIVE STATEMENT: Reports that since taking the BP pill, I don't have the instant dizziness. Reports that she is caring for her 2 older brothers and that comes with a lot of stress. Reports that she is getting at least 5 hours of sleep- I think it's getting better.  Pt accompanied by: self  PERTINENT HISTORY: 11/10/2023-11/11/2023 PMH of hypothyroidism, class II obesity and prediabetes presented to [facility] with vertigo, nausea and vomiting, and found to have small focus of acute ischemia within the superior left cerebral vermis and old right cerebellar infarct on MRI.  CT head and CT angio head and neck were unremarkable other than old right cerebral infarct.  PAIN:  Are you having pain? No  PRECAUTIONS: Fall  RED FLAGS: None   WEIGHT BEARING RESTRICTIONS: No  FALLS: Has patient fallen in last 6 months? Yes. Number of falls 1 fall on day she went to ED  LIVING ENVIRONMENT: Lives with: lives with their family Lives in: House/apartment Stairs: 2 steps to get into home; 2 flights of 7-8 steps to bedroom with rails Has following equipment at home: None  PLOF: Independent  PATIENT GOALS: To get better balance  OBJECTIVE:     TODAY'S TREATMENT: 12/08/23 Activity Comments                       TODAY'S TREATMENT: 12/03/23 Activity Comments  standing VOR vertical and horizontal in front of blinds 30 No dizziness   standing VOR cancellation on foam 2x30 No dizziness; c/o some instability on foam   gait + vertical and horizontal ball toss with visual tracking Practiced ball toss in place first; difficulty maintaining continuity of stepping with vertical ball toss but no dizziness   grapevine walk  CGA and verbal/manual cueing; pt initially following pattern but this improved with practice   side stepping over cones  Less stability on R LE        HOME EXERCISE PROGRAM Access Code: [email or web address] URL: [email or web address] Date:  12/03/2023 Prepared by: [facility] - Outpatient  Rehab - [facility]  Program Notes allow symptoms to rise to 3-4/10 dizziness; perform standing exercises near counter for safety  Exercises - Romberg Stance with Eyes Closed  - 1 x daily - 5 x weekly - 2 sets - 30 sec hold - Tandem Walking with Counter Support  - 1 x daily - 5 x weekly - 4 sets - Side Step Overs with Cones and Unilateral Counter Support  - 1 x daily - 5 x weekly - 2 sets - 5 reps - Celeste Cola Toss with Eye Tracking While Walking  - 1 x daily - 5 x weekly - 2 sets - 5 reps     PATIENT EDUCATION: Education details: HEP update with edu for safety; discussed resuming walking program on more level surfaces for now and suggested grocery store, mall, or school track  Person educated: Patient Education method: Explanation, Demonstration, Tactile cues, Verbal cues, and Handouts Education comprehension: verbalized understanding and returned demonstration    Note: Objective measures were completed at Evaluation unless otherwise noted.  DIAGNOSTIC FINDINGS: see above in pertinent hx section  COGNITION: Overall cognitive status: Within functional limits for tasks assessed    VESTIBULAR ASSESSMENT   GENERAL OBSERVATION: No acute distress.   Turning quickly brings on dizziness. Quick head movements bring on dizziness.  Feel like I've definitely improved. (Initial ED visit-was being pulled to L side, falling all over the place and this has improved).  Have had vertigo in the past   OCULOMOTOR EXAM:   Ocular ROM: No Limitations   Spontaneous Nystagmus: absent   Gaze-Induced Nystagmus: absent   Smooth Pursuits: intact   Saccades: intact   Convergence/Divergence: approx 18 cm    VESTIBULAR - OCULAR REFLEX:    Slow VOR: Comment: unable to sustain, feels dizzy, 6/10   VOR Cancellation: Comment: performs slowly, rates 3-4/10 dizziness   Head-Impulse Test: HIT Left: positive       POSITIONAL TESTING: NT due to pt's c/o not in  line with positional vertigo today          M-CTSIB  Condition 1: Firm Surface, EO 30 Sec, Mild Sway  Condition 2: Firm Surface, EC 30 Sec, Moderate Sway  Condition 3: Foam Surface, EO 30 Sec, Moderate Sway  Condition 4: Foam Surface, EC 10 Sec, Severe Sway    TRANSFERS: Sit to stand: Modified independence  Assistive device utilized: None     Stand to sit: Modified independence  Assistive device utilized: None       GAIT: Findings: Gait Characteristics: Trendelenburg/increased L lean with gait, step through pattern, decreased step length- Left, decreased stance time- Right, lateral lean- Left, and wide BOS, Distance walked: 50 ft, Assistive device utilized:None, Level of assistance: SBA, and Comments: increased lean to L with gait activities in DGI  FUNCTIONAL TESTS:  10 meter walk test: 16.69 sec (1.97 ft/sec) Dynamic Gait Index: 10/24 *Scores <19/24 indicate increased fall risk    PATIENT SURVEYS:  DHI 38                                                                                                                              TREATMENT DATE: 11/19/2023    PATIENT EDUCATION: Education details: Eval results, POC Person educated: Patient Education method: Explanation Education comprehension: verbalized understanding  HOME EXERCISE PROGRAM: Not yet initiated  GOALS: Goals reviewed with patient? Yes  SHORT TERM GOALS: Target date: 12/18/2023  Pt will be independent with HEP for improved balance, gait. Baseline: Goal status: MET 12/03/23  2.  Pt will improve DGI score to at least 15/24 to decrease fall risk. Baseline: 10/24 Goal status: IN PROGRESS  LONG TERM GOALS: Target date: 01/02/2024  Pt will be independent with HEP for improved balance, dizziness, gait. Baseline:  Goal status: IN PROGRESS  2.  Pt will improve DGI score to at least 19/24 to decrease fall risk. Baseline: 10/24 Goal status: IN PROGRESS  3.  Pt will improve gait velocity to at least 2.62  ft/sec for improved gait efficiency and safety. Baseline: 1.97 ft/sec Goal status: IN PROGRESS  4.  Pt will perform Condition 4 on MCTSIB 30 sec mod sway or better for improved balance. Baseline: 10 sec severe sway Goal status: IN PROGRESS  5.  Pt will improve DHI score to 20 or less for improved dizziness. Baseline: 38 Goal status:IN PROGRESS   ASSESSMENT:  CLINICAL IMPRESSION: Patient arrived to session with report of improvement in dizziness. Trialed activities involving optokinetic challenges/visual motion challenges. Patient denies dizziness. Dynamic balance activities revealed most difficulty with gait with vertical ball toss and sidestepping over obstacles with R LE stabilizing. HEP was consolidated and updated for max benefit. Patient reported understanding and without complaints upon leaving.   OBJECTIVE IMPAIRMENTS: Abnormal gait, decreased balance, decreased mobility, difficulty walking, and dizziness.   ACTIVITY LIMITATIONS: bending, standing, locomotion level, and caring for others  PARTICIPATION LIMITATIONS: meal prep, cleaning, laundry, and community activity  PERSONAL FACTORS: 3+ comorbidities: See PMH above are also affecting patient's functional outcome.   REHAB POTENTIAL: Good  CLINICAL DECISION MAKING: Evolving/moderate complexity  EVALUATION COMPLEXITY: Moderate  PLAN:  PT FREQUENCY: 2x/week  PT DURATION: 6 weeks plus eval  PLANNED INTERVENTIONS: 97750- Physical Performance Testing, 97110-Therapeutic exercises, 97530- Therapeutic activity, [id number]- Neuromuscular re-education, 97535- Self Care, 47829- Gait training, Patient/Family education, Balance training, and Vestibular training  PLAN FOR NEXT SESSION: review HEP to address VOR, multi-sensory balance, gait training.  Check motion sensitivity and provide habituation at needed; optokinetic challenges    Thaddeus Filippo, PT, DPT 12/05/23 9:31 AM  [facility]patient Rehab at [facility] [address] [place], [address] [place], [place] [address] Phone # [phone] Fax # [phone]

## 2023-12-06 ENCOUNTER — Other Ambulatory Visit (HOSPITAL_BASED_OUTPATIENT_CLINIC_OR_DEPARTMENT_OTHER): Payer: Self-pay

## 2023-12-08 ENCOUNTER — Other Ambulatory Visit: Payer: Self-pay | Admitting: Family Medicine

## 2023-12-08 ENCOUNTER — Ambulatory Visit: Admitting: Physical Therapy

## 2023-12-08 ENCOUNTER — Other Ambulatory Visit (HOSPITAL_BASED_OUTPATIENT_CLINIC_OR_DEPARTMENT_OTHER): Payer: Self-pay

## 2023-12-08 ENCOUNTER — Encounter: Payer: Self-pay | Admitting: Physical Therapy

## 2023-12-08 DIAGNOSIS — R42 Dizziness and giddiness: Secondary | ICD-10-CM | POA: Diagnosis not present

## 2023-12-08 DIAGNOSIS — R2681 Unsteadiness on feet: Secondary | ICD-10-CM | POA: Diagnosis not present

## 2023-12-08 DIAGNOSIS — R2689 Other abnormalities of gait and mobility: Secondary | ICD-10-CM | POA: Diagnosis not present

## 2023-12-08 DIAGNOSIS — I63342 Cerebral infarction due to thrombosis of left cerebellar artery: Secondary | ICD-10-CM | POA: Diagnosis not present

## 2023-12-08 DIAGNOSIS — Z1231 Encounter for screening mammogram for malignant neoplasm of breast: Secondary | ICD-10-CM

## 2023-12-09 ENCOUNTER — Encounter: Payer: Self-pay | Admitting: Adult Health

## 2023-12-09 ENCOUNTER — Ambulatory Visit (INDEPENDENT_AMBULATORY_CARE_PROVIDER_SITE_OTHER): Admitting: Adult Health

## 2023-12-09 VITALS — BP 152/82 | HR 68 | Ht 66.0 in | Wt 240.0 lb

## 2023-12-09 DIAGNOSIS — Z9189 Other specified personal risk factors, not elsewhere classified: Secondary | ICD-10-CM | POA: Diagnosis not present

## 2023-12-09 DIAGNOSIS — G4485 Primary stabbing headache: Secondary | ICD-10-CM | POA: Diagnosis not present

## 2023-12-09 DIAGNOSIS — I639 Cerebral infarction, unspecified: Secondary | ICD-10-CM

## 2023-12-09 NOTE — Progress Notes (Signed)
 [facility] [address] [place]. [place] [address] [phone]       HOSPITAL FOLLOW UP NOTE  [name] Date of Birth:  06/10/1951 Medical Record Number:  [id number]   Reason for Referral:  hospital stroke follow up    SUBJECTIVE:   CHIEF COMPLAINT:  Chief Complaint  Patient presents with   New Patient (Initial Visit)    RM 3, Pt alone. Here for f/u of Stroke on 11/10/23. Pt states she has HA periodially that only last a few seconds but then they resolve. Pt also states has had issues with a feeling like food is stuck. Used Rolaids and said she didn't think it helped.    HPI:   [name] is a 73 y.o. female with history of  thyroid  disease and carpal tunnel syndrome who presented to the ED on 11/10/2023 with complaints of vertigo and gait unsteadiness that were noted on awakening Monday as well as N/V and leaning to the left.  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  Imaging also showed remote cerebellar infarct , likely silent stroke.  CTA head/neck negative LVO or high-grade stenosis.  EF 65 to 70%.  LDL 195.  A1c 5.7.  Recommended DAPT for 3 weeks and aspirin  alone as well as initiated Crestor  40 mg daily.  PT/OT recommended outpatient PT for residual mild dizziness, was not seen by SLP.    Today, 12/09/2023, patient is being seen for initial hospital follow-up unaccompanied.  Overall has been doing well since discharge.  Continues working with PT for balance which has been gradually improving. Reports improvement of dizziness especially since PCP started amlodipine  shortly after hospital discharge.  She does complain of increased indigestion since her stroke, has tried Rolaids without benefit. Has not yet discussed with PCP. No new stroke/TIA symptoms.   Completed 3 weeks DAPT, remains on aspirin  alone as well as Crestor  without side effects. Monitors blood pressure at home about once  per week, last check was 140s/80s but was laying down in bed, use of wrist monitor. Has f/u next week with PCP.  Scheduled with Dr. Buck next month for sleep study evaluation.  She also mentions chronic head zap type sensation which was present prior to her stroke. Only lasts for a couple seconds then resolves. Does not occur daily. Not overly painful or limits functioning. Occurs randomly without specific trigger. Location is random, can occur on either side, can be occipital, parietal or frontal. Denies cranial autonomic symptoms.       PERTINENT IMAGING  CT head old right cerebellar infarct CTA head & neck no LVO or high-grade stenosis of the intracranial arteries.  MRI   Small focus of acute ischemia within the superior left cerebellar vermis. 2D Echo EF 65-70% LDL 195 HgbA1c 5.7    ROS:   14 system review of systems performed and negative with exception of those listed in HPI  PMH:  Past Medical History:  Diagnosis Date   Rash 03/12/2021   Stroke (HCC)    Thyroid  disease     PSH:  Past Surgical History:  Procedure Laterality Date   CARPAL TUNNEL RELEASE      Social History:  Social History   Socioeconomic History   Marital status: Divorced    Spouse name: Not on file   Number of children: Not on file   Years of education: Not on file   Highest education level: Not on file  Occupational History  Occupation: retired    Comment: Forensic scientist  Tobacco Use   Smoking status: Never    Passive exposure: Never   Smokeless tobacco: Never  Vaping Use   Vaping status: Never Used  Substance and Sexual Activity   Alcohol use: No   Drug use: No   Sexual activity: Not Currently    Birth control/protection: Condom  Other Topics Concern   Not on file  Social History Narrative   Lives in her own home with adult grandson.    Divorced for over 30 years   Jehovah's Witness.    Social Drivers of Corporate investment banker Strain: Low Risk  (11/05/2022)   Overall  Financial Resource Strain (CARDIA)    Difficulty of Paying Living Expenses: Not hard at all  Food Insecurity: No Food Insecurity (11/05/2022)   Hunger Vital Sign    Worried About Running Out of Food in the Last Year: Never true    Ran Out of Food in the Last Year: Never true  Transportation Needs: No Transportation Needs (11/05/2022)   PRAPARE - Administrator, Civil Service (Medical): No    Lack of Transportation (Non-Medical): No  Physical Activity: Sufficiently Active (11/05/2022)   Exercise Vital Sign    Days of Exercise per Week: 7 days    Minutes of Exercise per Session: 30 min  Stress: No Stress Concern Present (11/05/2022)   [employer] of Occupational Health - Occupational Stress Questionnaire    Feeling of Stress : Not at all  Social Connections: Socially Integrated (11/05/2022)   Social Connection and Isolation Panel    Frequency of Communication with Friends and Family: More than three times a week    Frequency of Social Gatherings with Friends and Family: More than three times a week    Attends Religious Services: More than 4 times per year    Active Member of [employer] or Organizations: Yes    Attends Engineer, structural: More than 4 times per year    Marital Status: Married  Catering manager Violence: Not At Risk (11/05/2022)   Humiliation, Afraid, Rape, and Kick questionnaire    Fear of Current or Ex-Partner: No    Emotionally Abused: No    Physically Abused: No    Sexually Abused: No    Family History:  Family History  Problem Relation Age of Onset   Hypertension Mother    Heart disease Mother    Diabetes Mother    Diabetes Sister    Diabetes Brother    Diabetes Other    Schizophrenia Grandson    Breast cancer Neg Hx     Medications:   Current Outpatient Medications on File Prior to Visit  Medication Sig Dispense Refill   amLODipine  (NORVASC ) 2.5 MG tablet Take 1 tablet (2.5 mg total) by mouth daily. 90 tablet 1   aspirin  81 MG  chewable tablet Chew 1 tablet (81 mg total) by mouth daily.     latanoprost  (XALATAN ) 0.005 % ophthalmic solution Place 1 drop into affected eye(s) every evening. 7.5 mL 11   rosuvastatin  (CRESTOR ) 40 MG tablet Take 1 tablet (40 mg total) by mouth at bedtime. 90 tablet 0   sodium chloride  (MURO 128) 5 % ophthalmic solution Place 1 drop into both eyes 2 (two) times daily. 15 mL 5   No current facility-administered medications on file prior to visit.    Allergies:  No Known Allergies    OBJECTIVE:  Physical Exam  Vitals:   12/09/23 0946  BP: (!) 152/82  Pulse: 68  Weight: 240 lb (108.9 kg)  Height: 5' 6 (1.676 m)   Body mass index is 38.74 kg/m. No results found.   General: well developed, well nourished, very pleasant elderly female, seated, in no evident distress Head: head normocephalic and atraumatic.   Neck: supple with no carotid or supraclavicular bruits Cardiovascular: regular rate and rhythm, no murmurs  Neurologic Exam Mental Status: Awake and fully alert.  Fluent speech and language.  Oriented to place and time. Recent and remote memory intact. Attention span, concentration and fund of knowledge appropriate. Mood and affect appropriate.  Cranial Nerves: Fundoscopic exam reveals sharp disc margins. Pupils equal, briskly reactive to light. Extraocular movements full without nystagmus. Visual fields full to confrontation. Hearing intact. Facial sensation intact. Face, tongue, palate moves normally and symmetrically.  Motor: Normal bulk and tone. Normal strength in all tested extremity muscles Sensory.: intact to touch , pinprick , position and vibratory sensation.  Coordination: Rapid alternating movements normal in all extremities. Finger-to-nose and heel-to-shin performed accurately bilaterally. Gait and Station: Arises from chair without difficulty. Stance is normal. Gait demonstrates normal stride length with mild imbalance without use of AD. Tandem walk and heel toe  with mod difficulty.  Reflexes: 1+ and symmetric. Toes downgoing.     NIHSS  0 Modified Rankin  2      ASSESSMENT: Devona Holmes is a 73 y.o. 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 Vascular risk factors include prior right cerebellar stroke on imaging, HLD and obesity. She also mentions chronic brain zap sensation over the past several years without clear cause.      PLAN:  Left cerebellar stroke:  Residual deficit: mild imbalance. Continue working with PT for hopeful ongoing recovery.   Continue aspirin  81mg  daily and rosuvastatin  (Crestor ) 40mg  daily for secondary stroke prevention managed/prescribed by PCP.   Discussed secondary stroke prevention measures and importance of close PCP follow up for aggressive stroke risk factor management including BP goal<130/90, HLD with LDL goal<70 and DM with A1c.<7 .  Stroke labs 10/2023: LDL 195, A1c 5.7 I have gone over the pathophysiology of stroke, warning signs and symptoms, risk factors and their management in some detail with instructions to go to the closest emergency room for symptoms of concern.  At risk for sleep apnea: Scheduled for initial evaluation with Dr. 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 If found to have underlying sleep apnea, treatment may help with symptoms.  If persists or become overly bothersome, can return to discuss treatment options     Overall stable from stroke standpoint and is closely being followed by PCP.  She can follow-up on an as-needed basis but advised to call with any questions or concerns in the future.   CC:  [facility] provider: Dr. Rosemarie PCP: [place], Quintin PARAS, MD    I personally spent a total of 65 minutes in the care of the patient today including preparing to see the  patient, getting/reviewing separately obtained history, performing a medically appropriate exam/evaluation, counseling and educating, placing orders, and documenting clinical information in the EHR. Time also spend reviewing hospitalization and pertinent imaging.  Harlene Bogaert, AGNP-BC  [facility] [address] [place], [place] [address]  Phone [phone] Fax [phone] Note: This document was prepared with digital dictation and  possible smart Lobbyist. Any transcriptional errors that result from this process are unintentional.

## 2023-12-09 NOTE — Patient Instructions (Addendum)
 Continue working with PT as scheduled  Please let me know if headaches persist or worsen - possibly related to underlying sleep apnea, if you are treated for this and headaches persist, please let me know. If you do not have sleep apnea and would like to discuss treatment, please let me know  Continue aspirin  81 mg daily (use enteric coated or EC to help with GI symptoms) and Crestor   for secondary stroke prevention  Continue to follow up with PCP regarding blood pressure and cholesterol management  Maintain strict control of hypertension with blood pressure goal below 130/90 and cholesterol with LDL cholesterol (bad cholesterol) goal below 70 mg/dL.   Signs of a Stroke? Follow the BEFAST method:  Balance Watch for a sudden loss of balance, trouble with coordination or vertigo Eyes Is there a sudden loss of vision in one or both eyes? Or double vision?  Face: Ask the person to smile. Does one side of the face droop or is it numb?  Arms: Ask the person to raise both arms. Does one arm drift downward? Is there weakness or numbness of a leg? Speech: Ask the person to repeat a simple phrase. Does the speech sound slurred/strange? Is the person confused ? Time: If you observe any of these signs, call 911.         Thank you for coming to see us  at [facility]. I hope we have been able to provide you high quality care today.  You may receive a patient satisfaction survey over the next few weeks. We would appreciate your feedback and comments so that we may continue to improve ourselves and the health of our patients.     Stroke Prevention Some medical conditions and lifestyle choices can lead to a higher risk for a stroke. You can help to prevent a stroke by eating healthy foods and exercising. It also helps to not smoke and to manage any health problems you may have. How can this condition affect me? A stroke is an emergency. It should be treated right away. A stroke  can lead to brain damage or threaten your life. There is a better chance of surviving and getting better after a stroke if you get medical help right away. What can increase my risk? The following medical conditions may increase your risk of a stroke: Diseases of the heart and blood vessels (cardiovascular disease). High blood pressure (hypertension). Diabetes. High cholesterol. Sickle cell disease. Problems with blood clotting. Being very overweight. Sleeping problems (obstructivesleep apnea). Other risk factors include: Being older than age 67. A history of blood clots, stroke, or mini-stroke (TIA). Race, ethnic background, or a family history of stroke. Smoking or using tobacco products. Taking birth control pills, especially if you smoke. Heavy alcohol and drug use. Not being active. What actions can I take to prevent this? Manage your health conditions High cholesterol. Eat a healthy diet. If this is not enough to manage your cholesterol, you may need to take medicines. Take medicines as told by your doctor. High blood pressure. Try to keep your blood pressure below 130/80. If your blood pressure cannot be managed through a healthy diet and regular exercise, you may need to take medicines. Take medicines as told by your doctor. Ask your doctor if you should check your blood pressure at home. Have your blood pressure checked every year. Diabetes. Eat a healthy diet and get regular exercise. If your blood sugar (glucose) cannot be managed through diet and exercise, you may need  to take medicines. Take medicines as told by your doctor. Talk to your doctor about getting checked for sleeping problems. Signs of a problem can include: Snoring a lot. Feeling very tired. Make sure that you manage any other conditions you have. Nutrition  Follow instructions from your doctor about what to eat or drink. You may be told to: Eat and drink fewer calories each day. Limit how much salt  (sodium) you use to 1,500 milligrams (mg) each day. Use only healthy fats for cooking, such as olive oil, canola oil, and sunflower oil. Eat healthy foods. To do this: Choose foods that are high in fiber. These include whole grains, and fresh fruits and vegetables. Eat at least 5 servings of fruits and vegetables a day. Try to fill one-half of your plate with fruits and vegetables at each meal. Choose low-fat (lean) proteins. These include low-fat cuts of meat, chicken without skin, fish, tofu, beans, and nuts. Eat low-fat dairy products. Avoid foods that: Are high in salt. Have saturated fat. Have trans fat. Have cholesterol. Are processed or pre-made. Count how many carbohydrates you eat and drink each day. Lifestyle If you drink alcohol: Limit how much you have to: 0-1 drink a day for women who are not pregnant. 0-2 drinks a day for men. Know how much alcohol is in your drink. In the [place] one drink equals one 12 oz bottle of beer ( ), one 5 oz glass of wine ( ), or one 1 oz glass of hard liquor (44mL). Do not smoke or use any products that have nicotine or tobacco. If you need help quitting, ask your doctor. Avoid secondhand smoke. Do not use drugs. Activity  Try to stay at a healthy weight. Get at least 30 minutes of exercise on most days, such as: Fast walking. Biking. Swimming. Medicines Take over-the-counter and prescription medicines only as told by your doctor. Avoid taking birth control pills. Talk to your doctor about the risks of taking birth control pills if: You are over 55 years old. You smoke. You get very bad headaches. You have had a blood clot. Where to find more information American Stroke Association: www.strokeassociation.org Get help right away if: You or a loved one has any signs of a stroke. BE FAST is an easy way to remember the warning signs: B - Balance. Dizziness, sudden trouble walking, or loss of balance. E - Eyes. Trouble seeing or  a change in how you see. F - Face. Sudden weakness or loss of feeling of the face. The face or eyelid may droop on one side. A - Arms. Weakness or loss of feeling in an arm. This happens all of a sudden and most often on one side of the body. S - Speech. Sudden trouble speaking, slurred speech, or trouble understanding what people say. T - Time. Time to call emergency services. Write down what time symptoms started. You or a loved one has other signs of a stroke, such as: A sudden, very bad headache with no known cause. Feeling like you may vomit (nausea). Vomiting. A seizure. These symptoms may be an emergency. Get help right away. Call your local emergency services (911 in the [place] Do not wait to see if the symptoms will go away. Do not drive yourself to the hospital. Summary You can help to prevent a stroke by eating healthy, exercising, and not smoking. It also helps to manage any health problems you have. Do not smoke or use any products that contain nicotine or tobacco. Get  help right away if you or a loved one has any signs of a stroke. This information is not intended to replace advice given to you by your health care provider. Make sure you discuss any questions you have with your health care provider. Document Revised: 05/06/2022 Document Reviewed: 05/06/2022 Elsevier Patient Education  2024 [employer].

## 2023-12-10 ENCOUNTER — Ambulatory Visit: Admitting: Physical Therapy

## 2023-12-10 DIAGNOSIS — R2689 Other abnormalities of gait and mobility: Secondary | ICD-10-CM

## 2023-12-10 DIAGNOSIS — R2681 Unsteadiness on feet: Secondary | ICD-10-CM | POA: Diagnosis not present

## 2023-12-10 DIAGNOSIS — R42 Dizziness and giddiness: Secondary | ICD-10-CM | POA: Diagnosis not present

## 2023-12-10 DIAGNOSIS — I63342 Cerebral infarction due to thrombosis of left cerebellar artery: Secondary | ICD-10-CM | POA: Diagnosis not present

## 2023-12-10 NOTE — Therapy (Signed)
 OUTPATIENT PHYSICAL THERAPY NEURO TREATMENT   Patient Name: Dawn Carlson MRN: [id number] DOB:31-Dec-1950, 73 y.o., female Today's Date: 12/10/2023   PCP: de [place], Quintin PARAS, MD REFERRING PROVIDER: Kathrin Mignon DASEN, MD >follow up with de [place], Raymond J, MD   END OF SESSION:  PT End of Session - 12/10/23 0802     Visit Number 6    Number of Visits 13    Date for PT Re-Evaluation 01/02/24    Authorization Type [employer]    Authorization Time Period approved 13 PT visits from 11/19/2023 - 01/02/2024    Authorization - Visit Number 6    Authorization - Number of Visits 13    PT Start Time 0804    PT Stop Time 0844    PT Time Calculation (min) 40 min    Equipment Utilized During Treatment Gait belt    Activity Tolerance Patient tolerated treatment well    Behavior During Therapy [facility] for tasks assessed/performed               Past Medical History:  Diagnosis Date   Rash 03/12/2021   Stroke [facility])    Thyroid  disease    Past Surgical History:  Procedure Laterality Date   CARPAL TUNNEL RELEASE     Patient Active Problem List   Diagnosis Date Noted   Primary hypertension 11/19/2023   Acute CVA (cerebrovascular accident) (HCC) 11/11/2023   Cerebellar stroke, acute (HCC) 11/11/2023   Hyperlipidemia 11/11/2023   Wellness examination 04/16/2022   Tick bite of abdominal wall 03/28/2022   Right knee pain 11/14/2021   Class 2 severe obesity due to excess calories with serious comorbidity and body mass index (BMI) of 37.0 to 37.9 in adult (HCC) 05/08/2021   Blood pressure elevated without history of HTN 05/08/2021   Pre-diabetes 05/08/2021   Insulin  resistance 05/08/2021   Acanthosis nigricans 05/08/2021   At high risk for cardiovascular disease 05/08/2021   Encounter for medical examination to establish care 03/12/2021   Leg heaviness 03/12/2021   Familial hyperlipidemia, 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   Tinnitus, bilateral 08/25/2018   Hypothyroidism 04/07/2006   DENTAL  CARIES, SEVERE 04/07/2006   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/2006   CARPAL TUNNEL RELEASE, HX OF 04/07/2006    ONSET DATE: 11/11/2023  REFERRING DIAG: P36.657 (ICD-10-CM) - 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  Toe still hurts, not as sore, and not sure what is going on with that.  Pt accompanied by: self  PERTINENT HISTORY: 11/10/2023-11/11/2023 PMH of hypothyroidism, class II obesity and prediabetes presented to [facility] with vertigo, nausea and vomiting, and found to have small focus of acute ischemia within the superior left cerebral vermis and old right cerebellar infarct on MRI.  CT head and CT angio head and neck were unremarkable other than old right cerebral infarct.   PAIN:  Are you having pain? The toe- It hurts mostly when I'm walking.  PRECAUTIONS: Fall  RED FLAGS: None   WEIGHT BEARING RESTRICTIONS: No  FALLS: Has patient fallen in last 6 months? Yes. Number of falls 1 fall on day she went to ED  LIVING ENVIRONMENT: Lives with: lives with their family Lives in: House/apartment Stairs: 2 steps to get into home; 2 flights of 7-8 steps to bedroom with rails Has following equipment at home: None  PLOF: Independent  PATIENT GOALS: To get better balance  OBJECTIVE:    TODAY'S TREATMENT: 12/10/2023 Activity Comments  Sit to stand, 3 x 5 reps 3rd set standing on Airex   Reviewed HEP: Step taps to cones x 10 reps each Fwd/back stepping with head nods  More unsteady LLE as stance-uses R UE Good stability  Romberg EO/EC 30  sec + head turns/nods Cues for abdominal activation  Partial tandem EO + head turns/nods 30 sec then EC 30    Tandem gait at counter, then tandem march Cues for abdominal activation  On Airex: Heel/toe raises Step taps-side, forward, back Step up/up, down/down          HOME EXERCISE PROGRAM Access Code: [email or web address] URL: [email or web address] Date: 12/08/2023 Prepared by: [facility] - Outpatient  Rehab - [facility]  Exercises - Tandem Walking with Counter Support  - 1 x daily - 5 x weekly - 4 sets - Side Step Overs with Cones and Unilateral Counter Support  - 1 x daily - 5 x weekly - 2 sets - 5 reps - Mercer Toss with Eye Tracking While Walking  - 1 x daily - 5 x weekly - 2 sets - 5 reps - Standing Toe Taps  - 1 x daily - 5 x weekly - 2 sets - 10 reps - Alternating Step Forward with Support  - 1 x daily - 5 x weekly - 2 sets - 10 reps    PATIENT EDUCATION: Education details: Continue current HEP  Person educated: Patient Education method: Explanation, Demonstration, Tactile cues, Verbal cues, and Handouts Education comprehension: verbalized understanding and returned demonstration     Note: Objective measures were completed at Evaluation unless otherwise noted.  DIAGNOSTIC FINDINGS: see above in pertinent hx section  COGNITION: Overall cognitive status: Within functional limits for tasks assessed    VESTIBULAR ASSESSMENT   GENERAL OBSERVATION: No acute distress.   Turning quickly brings on dizziness. Quick head movements bring on dizziness.  Feel like I've definitely improved. (Initial ED visit-was being pulled to L side, falling all over the place and this has improved).  Have had vertigo in the past   OCULOMOTOR EXAM:   Ocular ROM: No Limitations   Spontaneous Nystagmus: absent   Gaze-Induced Nystagmus: absent   Smooth Pursuits: intact   Saccades: intact   Convergence/Divergence: approx 18 cm    VESTIBULAR - OCULAR REFLEX:    Slow VOR:  Comment: unable to sustain, feels dizzy, 6/10   VOR Cancellation:  Comment: performs slowly, rates 3-4/10 dizziness   Head-Impulse Test: HIT Left: positive       POSITIONAL TESTING: NT due to pt's c/o not in line with positional vertigo today          M-CTSIB  Condition 1: Firm Surface, EO 30 Sec, Mild Sway  Condition 2: Firm Surface, EC 30 Sec, Moderate Sway  Condition 3: Foam Surface, EO 30 Sec, Moderate Sway  Condition 4: Foam Surface, EC 10 Sec, Severe Sway    TRANSFERS: Sit to stand: Modified independence  Assistive device utilized: None     Stand to sit: Modified independence  Assistive device utilized: None       GAIT: Findings: Gait Characteristics: Trendelenburg/increased L lean with gait, step through pattern, decreased step length- Left, decreased stance time- Right, lateral lean- Left, and wide BOS, Distance walked: 50 ft, Assistive device utilized:None, Level of assistance: SBA, and Comments: increased lean to L with gait activities in DGI  FUNCTIONAL TESTS:  10 meter walk test: 16.69 sec (1.97 ft/sec) Dynamic Gait Index: 10/24 *Scores <19/24 indicate increased fall risk    PATIENT SURVEYS:  DHI 38                                                                                                                              TREATMENT DATE: 11/19/2023    PATIENT EDUCATION: Education details: Eval results, POC Person educated: Patient Education method: Explanation Education comprehension: verbalized understanding  HOME EXERCISE PROGRAM: Not yet initiated  GOALS: Goals reviewed with patient? Yes  SHORT TERM GOALS: Target date: 12/18/2023  Pt will be independent with HEP for improved balance, gait. Baseline: Goal status: MET 12/03/23  2.  Pt will improve DGI score to at least 15/24 to decrease fall risk. Baseline: 10/24 Goal status: IN PROGRESS  LONG TERM GOALS: Target date: 01/02/2024  Pt will be independent with HEP for improved balance, dizziness,  gait. Baseline:  Goal status: IN PROGRESS  2.  Pt will improve DGI score to at least 19/24 to decrease fall risk. Baseline: 10/24 Goal status: IN PROGRESS  3.  Pt will improve gait velocity to at least 2.62 ft/sec for improved gait efficiency and safety. Baseline: 1.97 ft/sec Goal status: IN PROGRESS  4.  Pt will perform Condition 4 on MCTSIB 30 sec mod sway or better for improved balance. Baseline: 10 sec severe sway Goal status: IN PROGRESS  5.  Pt will improve DHI score to 20 or less for improved dizziness. Baseline: 38 Goal status:IN PROGRESS   ASSESSMENT:  CLINICAL IMPRESSION: Pt presents today with no new complaints; toe pain is getting better. Skilled PT session focused on balance on solid and compliant surfaces.  She continues to be challenged with narrowed BOS and with SLS activities.  Pt needs occasional/intermittent UE support for balance. Pt will continue to benefit from skilled PT towards goals for improved functional mobility and decreased fall risk.   OBJECTIVE IMPAIRMENTS: Abnormal gait, decreased balance,  decreased mobility, difficulty walking, and dizziness.   ACTIVITY LIMITATIONS: bending, standing, locomotion level, and caring for others  PARTICIPATION LIMITATIONS: meal prep, cleaning, laundry, and community activity  PERSONAL FACTORS: 3+ comorbidities: See PMH above are also affecting patient's functional outcome.   REHAB POTENTIAL: Good  CLINICAL DECISION MAKING: Evolving/moderate complexity  EVALUATION COMPLEXITY: Moderate  PLAN:  PT FREQUENCY: 2x/week  PT DURATION: 6 weeks plus eval  PLANNED INTERVENTIONS: 97750- Physical Performance Testing, 97110-Therapeutic exercises, 97530- Therapeutic activity, 97112- Neuromuscular re-education, 97535- Self Care, 02883- Gait training, Patient/Family education, Balance training, and Vestibular training  PLAN FOR NEXT SESSION: Check STG; review and progress HEP multi-sensory balance, gait training  Greig Anon, PT 12/10/23 8:46 AM Phone: [phone] Fax: [phone]  [facility] Re[facility] at [facility] Neuro [address], [address] [place], [place] [address] Phone # [phone] Fax # [phone]

## 2023-12-10 NOTE — Progress Notes (Signed)
 I agree with the above plan

## 2023-12-15 ENCOUNTER — Ambulatory Visit: Admitting: Physical Therapy

## 2023-12-15 ENCOUNTER — Encounter: Payer: Self-pay | Admitting: Physical Therapy

## 2023-12-15 DIAGNOSIS — I63342 Cerebral infarction due to thrombosis of left cerebellar artery: Secondary | ICD-10-CM | POA: Diagnosis not present

## 2023-12-15 DIAGNOSIS — R2689 Other abnormalities of gait and mobility: Secondary | ICD-10-CM

## 2023-12-15 DIAGNOSIS — R2681 Unsteadiness on feet: Secondary | ICD-10-CM | POA: Diagnosis not present

## 2023-12-15 DIAGNOSIS — R42 Dizziness and giddiness: Secondary | ICD-10-CM | POA: Diagnosis not present

## 2023-12-15 NOTE — Therapy (Signed)
 OUTPATIENT PHYSICAL THERAPY NEURO TREATMENT   Patient Name: Dawn Carlson MRN: [id number] DOB:21-Jan-1951, 73 y.o., female Today's Date: 12/15/2023   PCP: de [place], Quintin PARAS, MD REFERRING PROVIDER: Kathrin Mignon DASEN, MD >follow up with de [place], Raymond J, MD   END OF SESSION:  PT End of Session - 12/15/23 0806     Visit Number 7    Number of Visits 13    Date for PT Re-Evaluation 01/02/24    Authorization Type [employer]    Authorization Time Period approved 13 PT visits from 11/19/2023 - 01/02/2024    Authorization - Visit Number 7    Authorization - Number of Visits 13    PT Start Time 0804    PT Stop Time 0844    PT Time Calculation (min) 40 min    Equipment Utilized During Treatment Gait belt    Activity Tolerance Patient tolerated treatment well    Behavior During Therapy [facility] for tasks assessed/performed                Past Medical History:  Diagnosis Date   Rash 03/12/2021   Stroke [facility])    Thyroid  disease    Past Surgical History:  Procedure Laterality Date   CARPAL TUNNEL RELEASE     Patient Active Problem List   Diagnosis Date Noted   Primary hypertension 11/19/2023   Acute CVA (cerebrovascular accident) (HCC) 11/11/2023   Cerebellar stroke, acute (HCC) 11/11/2023   Hyperlipidemia 11/11/2023   Wellness examination 04/16/2022   Tick bite of abdominal wall 03/28/2022   Right knee pain 11/14/2021   Class 2 severe obesity due to excess calories with serious comorbidity and body mass index (BMI) of 37.0 to 37.9 in adult (HCC) 05/08/2021   Blood pressure elevated without history of HTN 05/08/2021   Pre-diabetes 05/08/2021   Insulin  resistance 05/08/2021   Acanthosis nigricans 05/08/2021   At high risk for cardiovascular disease 05/08/2021   Encounter for medical examination to establish care 03/12/2021   Leg heaviness 03/12/2021   Familial hyperlipidemia, 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   Tinnitus, bilateral 08/25/2018   Hypothyroidism 04/07/2006   DENTAL  CARIES, SEVERE 04/07/2006   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/2006   CARPAL TUNNEL RELEASE, HX OF 04/07/2006    ONSET DATE: 11/11/2023  REFERRING DIAG: P36.657 (ICD-10-CM) - 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  Think exercises still going pretty good.  Pt accompanied by: self  PERTINENT HISTORY: 11/10/2023-11/11/2023 PMH of hypothyroidism, class II obesity and prediabetes presented to [facility] with vertigo, nausea and vomiting, and found to have small focus of acute ischemia within the superior left cerebral vermis and old right cerebellar infarct on MRI.  CT head and CT angio head and neck were unremarkable other than old right cerebral infarct.   PAIN:  Are you having pain? The toe- It hurts mostly when I'm walking.  PRECAUTIONS: Fall  RED FLAGS: None   WEIGHT BEARING RESTRICTIONS: No  FALLS: Has patient fallen in last 6 months? Yes. Number of falls 1 fall on day she went to ED  LIVING ENVIRONMENT: Lives with: lives with their family Lives in: House/apartment Stairs: 2 steps to get into home; 2 flights of 7-8 steps to bedroom with rails Has following equipment at home: None  PLOF: Independent  PATIENT GOALS: To get better balance  OBJECTIVE:    TODAY'S TREATMENT: 12/15/2023 Activity Comments  Dynamic gait and balance: Forward/back walking Sidestepping Gait with head turns/nods 2 min each  Obstacle negotiation stepping over and around obstacles   On Airex: Heel/toe raises Step taps-side, forward, back Step up/up, down/down Marching in  place   On Airex: Feet apart/together EO/EC + head turns/nods 30 sec Increased sway with narrow BOS, light UE support         HOME EXERCISE PROGRAM: Access Code: [email or web address] URL: [email or web address] Date: 12/15/2023 Prepared by: [facility] - Outpatient  Rehab - [facility]  Exercises - Tandem Walking with Counter Support  - 1 x daily - 5 x weekly - 4 sets - Side Step Overs with Cones and Unilateral Counter Support  - 1 x daily - 5 x weekly - 2 sets - 5 reps - Mercer Toss with Eye Tracking While Walking  - 1 x daily - 5 x weekly - 2 sets - 5 reps - Standing Toe Taps  - 1 x daily - 5 x weekly - 2 sets - 10 reps - Alternating Step Forward with Support  - 1 x daily - 5 x weekly - 2 sets - 10 reps - Narrow Stance with Counter Support  - 1 x daily - 7 x weekly - 1 sets - 10 reps    PATIENT EDUCATION: Education details: Updates to HEP  Person educated: Patient Education method: Explanation, Demonstration, Tactile cues, Verbal cues, and Handouts Education comprehension: verbalized understanding and returned demonstration     Note: Objective measures were completed at Evaluation unless otherwise noted.  DIAGNOSTIC FINDINGS: see above in pertinent hx section  COGNITION: Overall cognitive status: Within functional limits for tasks assessed    VESTIBULAR ASSESSMENT   GENERAL OBSERVATION: No acute distress.   Turning quickly brings on dizziness. Quick head movements bring on dizziness.  Feel like I've definitely improved. (Initial ED visit-was being pulled to L side, falling all over the place and this has improved).  Have had vertigo in the past   OCULOMOTOR EXAM:   Ocular ROM: No Limitations   Spontaneous Nystagmus: absent   Gaze-Induced Nystagmus: absent   Smooth Pursuits: intact   Saccades: intact   Convergence/Divergence: approx 18 cm    VESTIBULAR - OCULAR REFLEX:    Slow VOR: Comment: unable to sustain, feels dizzy, 6/10   VOR Cancellation: Comment:  performs slowly, rates 3-4/10 dizziness   Head-Impulse Test: HIT Left: positive       POSITIONAL TESTING: NT due  to pt's c/o not in line with positional vertigo today          M-CTSIB  Condition 1: Firm Surface, EO 30 Sec, Mild Sway  Condition 2: Firm Surface, EC 30 Sec, Moderate Sway  Condition 3: Foam Surface, EO 30 Sec, Moderate Sway  Condition 4: Foam Surface, EC 10 Sec, Severe Sway    TRANSFERS: Sit to stand: Modified independence  Assistive device utilized: None     Stand to sit: Modified independence  Assistive device utilized: None       GAIT: Findings: Gait Characteristics: Trendelenburg/increased L lean with gait, step through pattern, decreased step length- Left, decreased stance time- Right, lateral lean- Left, and wide BOS, Distance walked: 50 ft, Assistive device utilized:None, Level of assistance: SBA, and Comments: increased lean to L with gait activities in DGI  FUNCTIONAL TESTS:  10 meter walk test: 16.69 sec (1.97 ft/sec) Dynamic Gait Index: 10/24 *Scores <19/24 indicate increased fall risk    PATIENT SURVEYS:  DHI 38                                                                                                                              TREATMENT DATE: 11/19/2023    PATIENT EDUCATION: Education details: Eval results, POC Person educated: Patient Education method: Explanation Education comprehension: verbalized understanding  HOME EXERCISE PROGRAM: Not yet initiated  GOALS: Goals reviewed with patient? Yes  SHORT TERM GOALS: Target date: 12/18/2023  Pt will be independent with HEP for improved balance, gait. Baseline: Goal status: MET 12/03/23  2.  Pt will improve DGI score to at least 15/24 to decrease fall risk. Baseline: 10/24 Goal status: IN PROGRESS  LONG TERM GOALS: Target date: 01/02/2024  Pt will be independent with HEP for improved balance, dizziness, gait. Baseline:  Goal status: IN PROGRESS  2.  Pt will improve DGI score to  at least 19/24 to decrease fall risk. Baseline: 10/24 Goal status: IN PROGRESS  3.  Pt will improve gait velocity to at least 2.62 ft/sec for improved gait efficiency and safety. Baseline: 1.97 ft/sec Goal status: IN PROGRESS  4.  Pt will perform Condition 4 on MCTSIB 30 sec mod sway or better for improved balance. Baseline: 10 sec severe sway Goal status: IN PROGRESS  5.  Pt will improve DHI score to 20 or less for improved dizziness. Baseline: 38 Goal status:IN PROGRESS   ASSESSMENT:  CLINICAL IMPRESSION: Pt presents today with no new complaints. Skilled PT session focused on dynamic and compliant surface balance. Pt needs light UE with narrowed BOS and EC exercises on foam; no overt LOB noted, but just increased sway.  Pt will continue to benefit from skilled PT towards goals for improved functional mobility and decreased fall risk.   OBJECTIVE IMPAIRMENTS: Abnormal gait, decreased balance, decreased mobility, difficulty walking, and dizziness.   ACTIVITY LIMITATIONS: bending, standing, locomotion level, and caring for others  PARTICIPATION LIMITATIONS: meal prep, cleaning, laundry, and community activity  PERSONAL FACTORS: 3+ comorbidities: See PMH above are also affecting patient's functional outcome.   REHAB POTENTIAL: Good  CLINICAL DECISION MAKING: Evolving/moderate complexity  EVALUATION COMPLEXITY: Moderate  PLAN:  PT FREQUENCY: 2x/week  PT DURATION: 6 weeks plus eval  PLANNED INTERVENTIONS: 97750- Physical Performance Testing, 97110-Therapeutic exercises, 97530- Therapeutic activity, 97112- Neuromuscular re-education, 97535- Self Care, 02883- Gait training, Patient/Family education, Balance training, and Vestibular training  PLAN FOR NEXT SESSION: Check STG; review and progress HEP multi-sensory balance, gait training  Greig Anon, PT 12/15/23 8:46 AM Phone: [phone] Fax: [phone]  [facility] Outpatient Rehab at [facility] Neuro [address] [place], [place] [address] Phone # [phone] Fax # [phone]

## 2023-12-16 NOTE — Therapy (Signed)
 OUTPATIENT PHYSICAL THERAPY NEURO TREATMENT   Patient Name: Dawn Carlson MRN: [id number] DOB:1951-01-04, 73 y.o., female Today's Date: 12/17/2023   PCP: de Peru, Quintin PARAS, MD REFERRING PROVIDER: Kathrin Mignon DASEN, MD >follow up with de Peru, Raymond J, MD   END OF SESSION:  PT End of Session - 12/17/23 0843     Visit Number 8    Number of Visits 13    Date for PT Re-Evaluation 01/02/24    Authorization Type [employer]    Authorization Time Period approved 13 PT visits from 11/19/2023 - 01/02/2024    Authorization - Visit Number 8    Authorization - Number of Visits 13    PT Start Time 0803    PT Stop Time 0842    PT Time Calculation (min) 39 min    Equipment Utilized During Treatment Gait belt    Activity Tolerance Patient tolerated treatment well    Behavior During Therapy [facility] for tasks assessed/performed                 Past Medical History:  Diagnosis Date   Rash 03/12/2021   Stroke [facility])    Thyroid  disease    Past Surgical History:  Procedure Laterality Date   CARPAL TUNNEL RELEASE     Patient Active Problem List   Diagnosis Date Noted   Primary hypertension 11/19/2023   Acute CVA (cerebrovascular accident) (HCC) 11/11/2023   Cerebellar stroke, acute (HCC) 11/11/2023   Hyperlipidemia 11/11/2023   Wellness examination 04/16/2022   Tick bite of abdominal wall 03/28/2022   Right knee pain 11/14/2021   Class 2 severe obesity due to excess calories with serious comorbidity and body mass index (BMI) of 37.0 to 37.9 in adult (HCC) 05/08/2021   Blood pressure elevated without history of HTN 05/08/2021   Pre-diabetes 05/08/2021   Insulin  resistance 05/08/2021   Acanthosis nigricans 05/08/2021   At high risk for cardiovascular disease 05/08/2021   Encounter for medical examination to establish care 03/12/2021   Leg heaviness 03/12/2021   Familial hyperlipidemia, 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   Tinnitus, bilateral 08/25/2018   Hypothyroidism 04/07/2006    DENTAL CARIES, SEVERE 04/07/2006   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/2006   CARPAL TUNNEL RELEASE, HX OF 04/07/2006    ONSET DATE: 11/11/2023  REFERRING DIAG: P36.657 (ICD-10-CM) - 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 Reports that she has returned to cooking and walking her daughter's dog.   Pt accompanied by: self  PERTINENT HISTORY: 11/10/2023-11/11/2023 PMH of hypothyroidism, class II obesity and prediabetes presented to [facility] with vertigo, nausea and vomiting, and found to have small focus of acute ischemia within the superior left cerebral vermis and old right cerebellar infarct on MRI.  CT head and CT angio head and neck were unremarkable other than old right cerebral infarct.   PAIN:  Are you having pain? The toe- It hurts mostly when I'm walking.  PRECAUTIONS: Fall  RED FLAGS: None   WEIGHT BEARING RESTRICTIONS: No  FALLS: Has patient fallen in last 6 months? Yes. Number of falls 1 fall on day she went to ED  LIVING ENVIRONMENT: Lives with: lives with their family Lives in: House/apartment Stairs: 2 steps to get into home; 2 flights of 7-8 steps to bedroom with rails Has following equipment at home: None  PLOF: Independent  PATIENT GOALS: To get better balance  OBJECTIVE:     TODAY'S TREATMENT: 12/17/23 Activity Comments  DGI 22/24  gait + head turns/nods Lateral trunk lean evident  Gait + diagonal head movements  Mild-moderate unsteadiness with no LOB  rockerboard  ant/pos and R/L static and dynamic balance Cueing for proper form and eliciting ankle reflexes; weaned to light fingertip support   step ups on bosu Weaned to 1 UE support       [facility] PT Assessment - 12/17/23 0001       Dynamic Gait Index   Level Surface Normal    Change in Gait Speed Normal    Gait with Horizontal Head Turns Mild Impairment    Gait with Vertical Head Turns Mild Impairment    Gait and Pivot Turn Normal    Step Over Obstacle Normal    Step Around Obstacles Normal    Steps Normal   c/o mild wooziness when looking down at stripes on stairs   Total Score 22           PATIENT EDUCATION: Education details: edu on decreased falls risk and encouraged resuming short walks with walking stick in hand, HEP update with edu for safety, discussed possible DC coming up Person educated: Patient Education method: Explanation, Demonstration, Tactile cues, Verbal cues, and Handouts Education comprehension: verbalized understanding and returned demonstration    HOME EXERCISE PROGRAM: Access Code: [email or web address] URL: [email or web address] Date: 12/17/2023 Prepared by: [facility] - Outpatient  Rehab - [facility]  Exercises - Tandem Walking with Counter Support  - 1 x daily - 5 x weekly - 4 sets - Side Step Overs with Cones and Unilateral Counter Support  - 1 x daily - 5 x weekly - 2 sets - 5 reps - Mercer Toss with Eye Tracking While Walking  - 1 x daily - 5 x weekly - 2 sets - 5 reps - Standing Toe Taps  - 1 x daily - 5 x weekly - 2 sets - 10 reps - Alternating Step Forward with Support  - 1 x daily - 5 x weekly - 2 sets - 10 reps - Narrow Stance with Counter Support  - 1 x daily - 7 x weekly - 1 sets - 10 reps - Forward Walking with Diagonal Head Turns  - 1 x daily - 5 x weekly - 2 sets - 5 reps      Note: Objective measures were completed at Evaluation unless otherwise  noted.  DIAGNOSTIC FINDINGS: see above in pertinent hx section  COGNITION: Overall  cognitive status: Within functional limits for tasks assessed    VESTIBULAR ASSESSMENT   GENERAL OBSERVATION: No acute distress.   Turning quickly brings on dizziness. Quick head movements bring on dizziness.  Feel like I've definitely improved. (Initial ED visit-was being pulled to L side, falling all over the place and this has improved).  Have had vertigo in the past   OCULOMOTOR EXAM:   Ocular ROM: No Limitations   Spontaneous Nystagmus: absent   Gaze-Induced Nystagmus: absent   Smooth Pursuits: intact   Saccades: intact   Convergence/Divergence: approx 18 cm    VESTIBULAR - OCULAR REFLEX:    Slow VOR: Comment: unable to sustain, feels dizzy, 6/10   VOR Cancellation: Comment: performs slowly, rates 3-4/10 dizziness   Head-Impulse Test: HIT Left: positive       POSITIONAL TESTING: NT due to pt's c/o not in line with positional vertigo today          M-CTSIB  Condition 1: Firm Surface, EO 30 Sec, Mild Sway  Condition 2: Firm Surface, EC 30 Sec, Moderate Sway  Condition 3: Foam Surface, EO 30 Sec, Moderate Sway  Condition 4: Foam Surface, EC 10 Sec, Severe Sway    TRANSFERS: Sit to stand: Modified independence  Assistive device utilized: None     Stand to sit: Modified independence  Assistive device utilized: None       GAIT: Findings: Gait Characteristics: Trendelenburg/increased L lean with gait, step through pattern, decreased step length- Left, decreased stance time- Right, lateral lean- Left, and wide BOS, Distance walked: 50 ft, Assistive device utilized:None, Level of assistance: SBA, and Comments: increased lean to L with gait activities in DGI  FUNCTIONAL TESTS:  10 meter walk test: 16.69 sec (1.97 ft/sec) Dynamic Gait Index: 10/24 *Scores <19/24 indicate increased fall risk  [facility] PT Assessment - 12/17/23 0001       Dynamic Gait Index   Level Surface Normal    Change in Gait Speed Normal    Gait with Horizontal Head Turns Mild Impairment    Gait with  Vertical Head Turns Mild Impairment    Gait and Pivot Turn Normal    Step Over Obstacle Normal    Step Around Obstacles Normal    Steps Normal   c/o mild wooziness when looking down at stripes on stairs   Total Score 22           PATIENT SURVEYS:  DHI 38                                                                                                                              TREATMENT DATE: 11/19/2023    PATIENT EDUCATION: Education details: Eval results, POC Person educated: Patient Education method: Explanation Education comprehension: verbalized understanding  HOME EXERCISE PROGRAM: Not yet initiated  GOALS: Goals reviewed with patient? Yes  SHORT TERM GOALS: Target date: 12/18/2023  Pt will be independent with HEP for improved balance, gait. Baseline: Goal status: MET 12/03/23  2.  Pt will improve DGI score to at least 15/24 to decrease fall risk. Baseline: 10/24; 22/24 12/17/23 Goal status: MET 12/17/23  LONG TERM GOALS: Target date: 01/02/2024  Pt will be independent with HEP for improved balance, dizziness, gait. Baseline:  Goal status: IN PROGRESS  2.  Pt will improve DGI score to at least 19/24 to decrease fall risk. Baseline: 10/24; 22/24 12/17/23  Goal status: MET 12/17/23  3.  Pt will improve gait velocity to at least 2.62 ft/sec for improved gait efficiency and safety. Baseline: 1.97 ft/sec Goal status: IN PROGRESS  4.  Pt will perform Condition 4 on MCTSIB 30 sec mod sway or better for improved balance. Baseline: 10 sec severe sway Goal status: IN PROGRESS  5.  Pt will improve DHI score to 20 or less for improved dizziness. Baseline: 38 Goal status:IN PROGRESS   ASSESSMENT:  CLINICAL IMPRESSION: Patient arrived to session with report of returning to cooking and walking her daughter's dog. Patient scored 22/24 on DGI, indicating a decreased risk of falls and this goal has been met. Proceeded with balance challenges incorporating head movement with  gait and compliant surface. Patient requires light UE support with compliant surface training but otherwise with good improvement in stability since starting therapy. No complaints upon leaving.  OBJECTIVE IMPAIRMENTS: Abnormal gait, decreased balance, decreased mobility, difficulty walking, and dizziness.   ACTIVITY LIMITATIONS: bending, standing, locomotion level, and caring for others  PARTICIPATION LIMITATIONS: meal prep, cleaning, laundry, and community activity  PERSONAL FACTORS: 3+ comorbidities: See PMH above are also affecting patient's functional outcome.   REHAB POTENTIAL: Good  CLINICAL DECISION MAKING: Evolving/moderate complexity  EVALUATION COMPLEXITY: Moderate  PLAN:  PT FREQUENCY: 2x/week  PT DURATION: 6 weeks plus eval  PLANNED INTERVENTIONS: 97750- Physical Performance Testing, 97110-Therapeutic exercises, 97530- Therapeutic activity, 97112- Neuromuscular re-education, [phone]- Self Care, [id number]- Gait training, Patient/Family education, Balance training, and Vestibular training  PLAN FOR NEXT SESSION: check goals and possible DC if pt meets remaining goals and has returned to walks without issues; review and progress HEP multi-sensory balance, gait training    Louana Terrilyn Christians, PT, DPT 12/17/23 8:44 AM  [facility] at [facility] [address] [place], [place] [address] Phone # [phone] Fax # [phone]

## 2023-12-17 ENCOUNTER — Ambulatory Visit: Attending: Student | Admitting: Physical Therapy

## 2023-12-17 ENCOUNTER — Encounter: Payer: Self-pay | Admitting: Physical Therapy

## 2023-12-17 ENCOUNTER — Encounter (HOSPITAL_BASED_OUTPATIENT_CLINIC_OR_DEPARTMENT_OTHER): Payer: Self-pay | Admitting: Family Medicine

## 2023-12-17 ENCOUNTER — Ambulatory Visit (INDEPENDENT_AMBULATORY_CARE_PROVIDER_SITE_OTHER): Admitting: Family Medicine

## 2023-12-17 VITALS — BP 124/81 | HR 77 | Ht 66.0 in | Wt 236.0 lb

## 2023-12-17 DIAGNOSIS — R2681 Unsteadiness on feet: Secondary | ICD-10-CM | POA: Insufficient documentation

## 2023-12-17 DIAGNOSIS — R1310 Dysphagia, unspecified: Secondary | ICD-10-CM | POA: Diagnosis not present

## 2023-12-17 DIAGNOSIS — I10 Essential (primary) hypertension: Secondary | ICD-10-CM

## 2023-12-17 DIAGNOSIS — R42 Dizziness and giddiness: Secondary | ICD-10-CM | POA: Insufficient documentation

## 2023-12-17 DIAGNOSIS — R2689 Other abnormalities of gait and mobility: Secondary | ICD-10-CM | POA: Diagnosis present

## 2023-12-17 NOTE — Patient Instructions (Signed)
  Medication Instructions:  Your physician recommends that you continue on your current medications as directed. Please refer to the Current Medication list given to you today. --If you need a refill on any your medications before your next appointment, please call your pharmacy first. If no refills are authorized on file call the office.--   Follow-Up: Your next appointment:   Your physician recommends that you schedule a follow-up appointment in: 3 months follow up  with Dr. de Peru  You will receive a text message or e-mail with a link to a survey about your care and experience with [place] today! We would greatly appreciate your feedback!   Thanks for letting us be apart of your health journey!!  Primary Care and Sports Medicine   Dr. Ceasar Mons Peru   We encourage you to activate your patient portal called "MyChart".  Sign up information is provided on this After Visit Summary.  MyChart is used to connect with patients for Virtual Visits (Telemedicine).  Patients are able to view lab/test results, encounter notes, upcoming appointments, etc.  Non-urgent messages can be sent to your provider as well. To learn more about what you can do with MyChart, please visit --  [email or web address].

## 2023-12-17 NOTE — Assessment & Plan Note (Signed)
 Blood pressure in office today is appropriate.  She continues with amlodipine  which was started at last office visit, denies any issues with medication.  She has been checking her blood pressure and was have some variable readings.  She does indicate that she will typically check her blood pressure first in the morning and oftentimes this will be before she has taken blood pressure medication.   At this time, recommend continuing with intermittent monitoring of blood pressure at home, DASH diet.  No changes to medication today

## 2023-12-17 NOTE — Progress Notes (Signed)
    Procedures performed today:    None.  Independent interpretation of notes and tests performed by another provider:   None.  Brief History, Exam, Impression, and Recommendations:    BP 124/81 (BP Location: Right Arm, Patient Position: Sitting, Cuff Size: Normal)   Pulse 77   Ht 5' 6 (1.676 m)   Wt 236 lb (107 kg)   SpO2 97%   BMI 38.09 kg/m   Primary hypertension Assessment & Plan: Blood pressure in office today is appropriate.  She continues with amlodipine  which was started at last office visit, denies any issues with medication.  She has been checking her blood pressure and was have some variable readings.  She does indicate that she will typically check her blood pressure first in the morning and oftentimes this will be before she has taken blood pressure medication.   At this time, recommend continuing with intermittent monitoring of blood pressure at home, DASH diet.  No changes to medication today   Dysphagia, unspecified type Assessment & Plan: She does that she has had some feelings of things getting stuck in her throat or chest.  She will have feelings that she may need to throw up which is not necessarily associated with eating or drinking as symptoms can occur hours after last time that she ate or drank anything.  She has not had any reported vomiting.  No significant associated pain. We discussed considerations today and she would prefer to continue with monitoring given that this is a fairly new issue for her.  We discussed consideration for referral to GI.  If symptoms do persist, she will likely want to proceed with referral, we will monitor moving forward   Return in about 3 months (around 03/18/2024) for hypertension.   ___________________________________________ Eura Radabaugh de [place], MD, ABFM, CAQSM Primary Care and Sports Medicine [facility]

## 2023-12-17 NOTE — Assessment & Plan Note (Signed)
 She does that she has had some feelings of things getting stuck in her throat or chest.  She will have feelings that she may need to throw up which is not necessarily associated with eating or drinking as symptoms can occur hours after last time that she ate or drank anything.  She has not had any reported vomiting.  No significant associated pain. We discussed considerations today and she would prefer to continue with monitoring given that this is a fairly new issue for her.  We discussed consideration for referral to GI.  If symptoms do persist, she will likely want to proceed with referral, we will monitor moving forward

## 2023-12-18 ENCOUNTER — Ambulatory Visit
Admission: RE | Admit: 2023-12-18 | Discharge: 2023-12-18 | Disposition: A | Discharge status: Home / Self Care | Source: Ambulatory Visit | Attending: Family Medicine | Admitting: Family Medicine

## 2023-12-18 DIAGNOSIS — Z1231 Encounter for screening mammogram for malignant neoplasm of breast: Secondary | ICD-10-CM | POA: Diagnosis not present

## 2023-12-22 ENCOUNTER — Ambulatory Visit

## 2023-12-22 DIAGNOSIS — R2689 Other abnormalities of gait and mobility: Secondary | ICD-10-CM

## 2023-12-22 DIAGNOSIS — R2681 Unsteadiness on feet: Secondary | ICD-10-CM

## 2023-12-22 DIAGNOSIS — R42 Dizziness and giddiness: Secondary | ICD-10-CM | POA: Diagnosis not present

## 2023-12-22 NOTE — Therapy (Signed)
 OUTPATIENT PHYSICAL THERAPY NEURO TREATMENT and D/C Summary   Patient Name: Dawn Carlson MRN: [id number] DOB:[birth year], 73 y.o., female Today's Date: 12/22/2023   PCP: everitt Peru, Quintin PARAS, MD REFERRING PROVIDER: Kathrin Mignon DASEN, MD >follow up with de Peru, Raymond J, MD  PHYSICAL THERAPY DISCHARGE SUMMARY  Visits from Start of Care: 9  Current functional level related to goals / functional outcomes: Goals met, outcome measures achieved   Remaining deficits: None noted   Education / Equipment: HEP   Patient agrees to discharge. Patient goals were met. Patient is being discharged due to meeting the stated rehab goals.  END OF SESSION:  PT End of Session - 12/22/23 0800     Visit Number 9    Number of Visits 13    Date for PT Re-Evaluation 01/02/24    Authorization Type [employer]    Authorization Time Period approved 13 PT visits from 11/19/2023 - 01/02/2024    Authorization - Visit Number 9    Authorization - Number of Visits 13    PT Start Time 0800    PT Stop Time 0845    PT Time Calculation (min) 45 min    Equipment Utilized During Treatment Gait belt    Activity Tolerance Patient tolerated treatment well    Behavior During Therapy WFL for tasks assessed/performed                 Past Medical History:  Diagnosis Date   Glaucoma 2024   Eye Right   Rash 03/12/2021   Stroke (HCC)    Thyroid  disease    Past Surgical History:  Procedure Laterality Date   ABDOMINAL HYSTERECTOMY  approximately 35 years ago   CARPAL TUNNEL RELEASE     Patient Active Problem List   Diagnosis Date Noted   Difficulty swallowing 12/17/2023   Primary hypertension 11/19/2023   Acute CVA (cerebrovascular accident) (HCC) 11/11/2023   Cerebellar stroke, acute (HCC) 11/11/2023   Hyperlipidemia 11/11/2023   Wellness examination 04/16/2022   Tick bite of abdominal wall 03/28/2022   Right knee pain 11/14/2021   Class 2 severe obesity due to excess calories with serious  comorbidity and body mass index (BMI) of 37.0 to 37.9 in adult (HCC) 05/08/2021   Blood pressure elevated without history of HTN 05/08/2021   Pre-diabetes 05/08/2021   Insulin  resistance 05/08/2021   Acanthosis nigricans 05/08/2021   At high risk for cardiovascular disease 05/08/2021   Encounter for medical examination to establish care 03/12/2021   Leg heaviness 03/12/2021   Familial hyperlipidemia, 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   Tinnitus, bilateral 08/25/2018   Hypothyroidism 04/07/2006   DENTAL CARIES, SEVERE 04/07/2006   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  Have walked the dog one time, but he pulls too much. Haven't noticed any dizziness with activities around the house.   Pt accompanied by: self  PERTINENT HISTORY: 11/10/2023-11/11/2023 PMH of hypothyroidism, class II obesity and prediabetes presented to [facility] with vertigo, nausea and vomiting, and found to have small focus of acute ischemia within the superior left cerebral vermis and old right cerebellar infarct on MRI.  CT head and CT angio head and neck were unremarkable other than old right cerebral infarct.   PAIN:  Are you having pain? The toe- It hurts mostly when I'm walking.  PRECAUTIONS: Fall  RED FLAGS: None   WEIGHT BEARING RESTRICTIONS: No  FALLS: Has patient fallen in last 6 months? Yes. Number  of falls 1 fall on day she went to ED  LIVING ENVIRONMENT: Lives with: lives with their family Lives in: House/apartment Stairs: 2 steps to get into home; 2 flights of 7-8 steps to bedroom with rails Has following equipment at home: None  PLOF: Independent  PATIENT GOALS: To get better balance  OBJECTIVE:   TODAY'S TREATMENT: 12/22/23 Activity Comments  treadmill  2 Min warm-up at 2.0 mph 30 sec speed to 2.3 mph x 2 rounds 60 sec 2.0 mph btwn (5 min)  10 meter walk 11 sec = 2.98 ft/sec  M-CTSIB Condition 2: normal-mild Condition 3: normal-mild Condition 4: mild  HEP review              TODAY'S TREATMENT: 12/17/23 Activity Comments  DGI 22/24  gait + head turns/nods Lateral trunk lean evident  Gait + diagonal head movements  Mild-moderate unsteadiness with no LOB  rockerboard ant/pos and R/L static and dynamic balance Cueing for proper form and eliciting ankle reflexes; weaned to light fingertip support   step ups on bosu Weaned to 1 UE support          PATIENT EDUCATION: Education details: edu on decreased falls risk and encouraged resuming short walks with walking stick in hand, HEP update with edu for safety, discussed possible DC coming up Person educated: Patient Education method: Explanation, Demonstration, Tactile cues, Verbal cues, and Handouts Education comprehension: verbalized understanding and returned demonstration    HOME EXERCISE PROGRAM: Access Code: [email or web address] URL: [email or web address] Date: 12/17/2023 Prepared by: [facility] - Outpatient  Rehab - [facility]  Exercises - Tandem Walking with Counter Support  - 1 x daily - 5 x weekly - 4 sets - Side Step Overs with Cones and Unilateral Counter Support  - 1 x daily - 5 x weekly - 2 sets - 5 reps - Mercer Toss with Eye Tracking While Walking  - 1 x daily - 5 x weekly - 2 sets - 5 reps - Standing Toe Taps  - 1 x daily - 5 x weekly - 2 sets - 10 reps - Alternating Step Forward with  Support  - 1 x daily - 5 x weekly - 2 sets - 10 reps - Narrow Stance with Counter Support  - 1 x daily - 7 x weekly - 1 sets - 10 reps - Forward Walking with Diagonal Head Turns  - 1 x daily - 5 x weekly - 2 sets - 5 reps      Note: Objective measures were completed at Evaluation unless otherwise noted.  DIAGNOSTIC FINDINGS: see above in pertinent hx section  COGNITION: Overall cognitive status: Within functional limits for tasks assessed    VESTIBULAR ASSESSMENT   GENERAL OBSERVATION:  No acute distress.   Turning quickly brings on dizziness. Quick head movements bring on dizziness.  Feel like I've definitely improved. (Initial ED visit-was being pulled to L side, falling all over the place and this has improved).  Have had vertigo in the past   OCULOMOTOR EXAM:   Ocular ROM: No Limitations   Spontaneous Nystagmus: absent   Gaze-Induced Nystagmus: absent   Smooth Pursuits: intact   Saccades: intact   Convergence/Divergence: approx 18 cm    VESTIBULAR - OCULAR REFLEX:    Slow VOR: Comment: unable to sustain, feels dizzy, 6/10   VOR Cancellation: Comment: performs slowly, rates 3-4/10 dizziness   Head-Impulse Test: HIT Left: positive       POSITIONAL TESTING: NT due to pt's c/o not in line with positional vertigo today          M-CTSIB  Condition 1: Firm Surface, EO 30 Sec, Mild Sway  Condition 2: Firm Surface, EC 30 Sec, Moderate Sway  Condition 3: Foam Surface, EO 30 Sec, Moderate Sway  Condition 4: Foam Surface, EC 10 Sec, Severe Sway    TRANSFERS: Sit to stand: Modified independence  Assistive device utilized: None     Stand to sit: Modified independence  Assistive device utilized: None       GAIT: Findings: Gait Characteristics: Trendelenburg/increased L lean with gait, step through pattern, decreased step length- Left, decreased stance time- Right, lateral lean- Left, and wide BOS, Distance walked: 50 ft, Assistive device utilized:None, Level of assistance:  SBA, and Comments: increased lean to L with gait activities in DGI  FUNCTIONAL TESTS:  10 meter walk test: 16.69 sec (1.97 ft/sec) Dynamic Gait Index: 10/24 *Scores <19/24 indicate increased fall risk     PATIENT SURVEYS:  DHI 38                                                                                                                              TREATMENT DATE: 11/19/2023    PATIENT EDUCATION: Education details: Eval results, POC Person educated: Patient Education method: Explanation Education comprehension: verbalized understanding  HOME EXERCISE PROGRAM: Not yet initiated  GOALS: Goals reviewed with patient? Yes  SHORT TERM GOALS: Target date: 12/18/2023  Pt will be independent with HEP for improved balance, gait. Baseline: Goal status: MET 12/03/23  2.  Pt will improve DGI score to at least 15/24 to decrease fall risk. Baseline: 10/24; 22/24 12/17/23 Goal status: MET 12/17/23  LONG TERM GOALS: Target date: 01/02/2024  Pt will be independent with HEP for improved balance, dizziness, gait. Baseline:  Goal status: MET  2.  Pt will improve DGI score to at least 19/24 to decrease fall risk. Baseline: 10/24; 22/24 12/17/23  Goal status: MET 12/17/23  3.  Pt will improve gait velocity to at least 2.62 ft/sec for improved gait efficiency and safety. Baseline: 1.97 ft/sec; 2.98 ft/sec Goal status: MET  4.  Pt will perform Condition 4 on MCTSIB 30 sec mod sway  or better for improved balance. Baseline: 10 sec severe sway; 30 sec mild Goal status:MET  5.  Pt will improve DHI score to 20 or less for improved dizziness. Baseline: 38; 0 Goal status:MET   ASSESSMENT:  CLINICAL IMPRESSION: Pt reports return to typical level of activities and has been able to resume neighborhood walks for exercise without difficulty and completes dynamic gait activities as part of HEP.  Improved postural stability evident with mild sway condition 4 M-CTSIB and increased gait speed to 2.98  ft/sec for casual walking speed improved from 1.97 ft/sec.  Gait training activities to improve speed to improve efficiency in community mobility and exercise tolerance benefits. Pt notes overall improvement in dizziness symptoms and rates 0% DHI as compared to previous 38%.  Pt feels confident in self-mgmt at this time and will D/C to HEP  OBJECTIVE IMPAIRMENTS: Abnormal gait, decreased balance, decreased mobility, difficulty walking, and dizziness.   ACTIVITY LIMITATIONS: bending, standing, locomotion level, and caring for others  PARTICIPATION LIMITATIONS: meal prep, cleaning, laundry, and community activity  PERSONAL FACTORS: 3+ comorbidities: See PMH above are also affecting patient's functional outcome.   REHAB POTENTIAL: Good  CLINICAL DECISION MAKING: Evolving/moderate complexity  EVALUATION COMPLEXITY: Moderate  PLAN:  PT FREQUENCY: 2x/week  PT DURATION: 6 weeks plus eval  PLANNED INTERVENTIONS: 97750- Physical Performance Testing, 97110-Therapeutic exercises, 97530- Therapeutic activity, [id number]- Neuromuscular re-education, 97535- Self Care, [id number]- Gait training, Patient/Family education, Balance training, and Vestibular training  PLAN FOR NEXT SESSION: D/C to HEP    8:32 AM, 12/22/23 M. Kelly Chiquetta Langner, PT, DPT Physical Therapist- [place] Office Number: [phone]

## 2023-12-23 ENCOUNTER — Ambulatory Visit (HOSPITAL_BASED_OUTPATIENT_CLINIC_OR_DEPARTMENT_OTHER): Payer: Self-pay | Admitting: Family Medicine

## 2023-12-24 ENCOUNTER — Ambulatory Visit

## 2023-12-25 ENCOUNTER — Ambulatory Visit: Admitting: Neurology

## 2023-12-25 ENCOUNTER — Encounter: Payer: Self-pay | Admitting: Neurology

## 2023-12-25 VITALS — BP 126/78 | HR 71 | Ht 66.0 in | Wt 253.0 lb

## 2023-12-25 DIAGNOSIS — Z820 Family history of epilepsy and other diseases of the nervous system: Secondary | ICD-10-CM

## 2023-12-25 DIAGNOSIS — Z9189 Other specified personal risk factors, not elsewhere classified: Secondary | ICD-10-CM | POA: Diagnosis not present

## 2023-12-25 DIAGNOSIS — Z8673 Personal history of transient ischemic attack (TIA), and cerebral infarction without residual deficits: Secondary | ICD-10-CM | POA: Diagnosis not present

## 2023-12-25 DIAGNOSIS — R0683 Snoring: Secondary | ICD-10-CM | POA: Diagnosis not present

## 2023-12-25 DIAGNOSIS — R351 Nocturia: Secondary | ICD-10-CM

## 2023-12-25 DIAGNOSIS — R519 Headache, unspecified: Secondary | ICD-10-CM

## 2023-12-25 DIAGNOSIS — R0681 Apnea, not elsewhere classified: Secondary | ICD-10-CM | POA: Diagnosis not present

## 2023-12-25 NOTE — Patient Instructions (Signed)

## 2023-12-25 NOTE — Progress Notes (Signed)
 Subjective:    Patient ID: Dawn Carlson is a 73 y.o. female.  HPI    True Mar, MD, PhD [facility] [address], [address] [address] [place], [place] [address]  Dear Dr. de Peru,   I saw your patient, Dawn Carlson, upon your kind request in my sleep clinic today for initial consultation of her sleep disorder, in particular, concern for underlying obstructive sleep apnea.  The patient is unaccompanied today.  As you know, Dawn Carlson is a 73 year old female with an underlying medical history of thyroid  disease, stroke in May 2025, glaucoma, hypertension, and severe obesity with a BMI of over 40, who reports snoring and excessive daytime somnolence as well as witnessed apneas per daughter's report.  Her Epworth sleepiness score is 2 out of 24, fatigue severity score is 13 out of 63.  I reviewed your office note from 11/19/2023.  She is divorced, she lives with one of her daughters.  She has 2 grown daughters, 3 grandchildren.  She has been taking care of her older brothers, 1 brother is 24 years old and widowed and the other brother is 43 and single.  She is retired as a Museum/gallery curator.  She is a non-smoker and does not drink any alcohol.  She has no pets in the household, she does not drink caffeine daily.  She goes to bed early between 4 and 6 PM but does not fall asleep until 8 or 9 PM.  She has nocturia about once per average night and has had occasional morning headaches, nothing severe or migraine-like, and does not typically take any medication for headaches.  She does watch TV in her bedroom in the late afternoon till she falls asleep.  Her oldest brother has history of sleep apnea.   Her Past Medical History Is Significant For: Past Medical History:  Diagnosis Date   Glaucoma 2024   Eye Right   Rash 03/12/2021   Stroke (HCC)    Thyroid  disease     Her Past Surgical History Is Significant For: Past Surgical History:  Procedure Laterality Date   ABDOMINAL  HYSTERECTOMY  approximately 35 years ago   CARPAL TUNNEL RELEASE      Her Family History Is Significant For: Family History  Problem Relation Age of Onset   Hypertension Mother    Heart disease Mother    Diabetes Mother    ADD / ADHD Mother    Arthritis Mother    Vision loss Mother    ADD / ADHD Father    Hearing loss Father    Diabetes Sister    Diabetes Brother    Diabetes Other    Schizophrenia Grandson    ADD / ADHD Sister    Diabetes Sister    ADD / ADHD Sister    Arthritis Sister    COPD Sister    Diabetes Sister    Heart disease Sister    Kidney disease Sister    ADD / ADHD Brother    COPD Brother    ADD / ADHD Brother    COPD Brother    Diabetes Brother    Hearing loss Brother    Vision loss Brother    ADD / ADHD Brother    Diabetes Brother    Hearing loss Brother    Vision loss Brother    Diabetes Sister    Breast cancer Neg Hx     Her Social History Is Significant For: Social History   Socioeconomic History  Marital status: Divorced    Spouse name: Not on file   Number of children: Not on file   Years of education: Not on file   Highest education level: Not on file  Occupational History   Occupation: retired    Comment: Forensic scientist  Tobacco Use   Smoking status: Never    Passive exposure: Never   Smokeless tobacco: Never  Vaping Use   Vaping status: Never Used  Substance and Sexual Activity   Alcohol use: No   Drug use: No   Sexual activity: Not Currently    Birth control/protection: Condom  Other Topics Concern   Not on file  Social History Narrative   Lives in her own home with adult grandson.    Divorced for over 30 years   Jehovah's Witness.    Social Drivers of Corporate investment banker Strain: Low Risk  (11/05/2022)   Overall Financial Resource Strain (CARDIA)    Difficulty of Paying Living Expenses: Not hard at all  Food Insecurity: No Food Insecurity (11/05/2022)   Hunger Vital Sign    Worried About Running Out of Food  in the Last Year: Never true    Ran Out of Food in the Last Year: Never true  Transportation Needs: No Transportation Needs (11/05/2022)   PRAPARE - Administrator, Civil Service (Medical): No    Lack of Transportation (Non-Medical): No  Physical Activity: Sufficiently Active (11/05/2022)   Exercise Vital Sign    Days of Exercise per Week: 7 days    Minutes of Exercise per Session: 30 min  Stress: No Stress Concern Present (11/05/2022)   [employer] of Occupational Health - Occupational Stress Questionnaire    Feeling of Stress : Not at all  Social Connections: Socially Integrated (11/05/2022)   Social Connection and Isolation Panel    Frequency of Communication with Friends and Family: More than three times a week    Frequency of Social Gatherings with Friends and Family: More than three times a week    Attends Religious Services: More than 4 times per year    Active Member of [employer] or Organizations: Yes    Attends Engineer, structural: More than 4 times per year    Marital Status: Married    Her Allergies Are:  No Known Allergies:   Her Current Medications Are:  Outpatient Encounter Medications as of 12/25/2023  Medication Sig   amLODipine  (NORVASC ) 2.5 MG tablet Take 1 tablet (2.5 mg total) by mouth daily.   aspirin  81 MG chewable tablet Chew 1 tablet (81 mg total) by mouth daily.   latanoprost  (XALATAN ) 0.005 % ophthalmic solution Place 1 drop into affected eye(s) every evening.   rosuvastatin  (CRESTOR ) 40 MG tablet Take 1 tablet (40 mg total) by mouth at bedtime.   sodium chloride  (MURO 128) 5 % ophthalmic solution Place 1 drop into both eyes 2 (two) times daily.   No facility-administered encounter medications on file as of 12/25/2023.  :   Review of Systems:  Out of a complete 14 point review of systems, all are reviewed and negative with the exception of these symptoms as listed below:  Review of Systems  Neurological:        Hypertension, sleep  consult ESS-2, FSS-13 Pt stated-snoring, stop breathing per daughter.    Objective:  Neurological Exam  Physical Exam Physical Examination:   Vitals:   12/25/23 1116  BP: 126/78  Pulse: 71    General Examination: The patient is  a very pleasant 73 y.o. female in no acute distress. She appears well-developed and well-nourished and well groomed.   HEENT: Normocephalic, atraumatic, pupils are equal, round and reactive to light, extraocular tracking is good without limitation to gaze excursion or nystagmus noted. Hearing is grossly intact. Face is symmetric with normal facial animation. Speech is clear with no dysarthria noted. There is no hypophonia. There is no lip, neck/head, jaw or voice tremor. Neck is supple with full range of passive and active motion. There are no carotid bruits on auscultation. Oropharynx exam reveals: mild mouth dryness, adequate dental hygiene, missing a few teeth, moderate airway crowding secondary to small airway entry, Mallampati class II, smaller tonsils, slightly wider and thicker tongue noted, neck circumference 16-7/8 inches, moderate overbite noted.  Tongue protrudes centrally and palate elevates symmetrically.  Chest: Clear to auscultation without wheezing, rhonchi or crackles noted.  Heart: S1+S2+0, regular and normal without murmurs, rubs or gallops noted.   Abdomen: Soft, non-tender and non-distended.  Extremities: There is puffiness noted around both ankles and distal lower extremities bilaterally.    Skin: Warm and dry without trophic changes noted.   Musculoskeletal: exam reveals no obvious joint deformities.   Neurologically:  Mental status: The patient is awake, alert and oriented in all 4 spheres. Her immediate and remote memory, attention, language skills and fund of knowledge are appropriate. There is no evidence of aphasia, agnosia, apraxia or anomia. Speech is clear with normal prosody and enunciation. Thought process is linear. Mood is  normal and affect is normal.  Cranial nerves II - XII are as described above under HEENT exam.  Motor exam: Normal bulk, strength and tone is noted. There is no obvious action or resting tremor.  Fine motor skills and coordination: grossly intact.  Cerebellar testing: No dysmetria or intention tremor. There is no truncal or gait ataxia.  Sensory exam: intact to light touch in the upper and lower extremities.  Gait, station and balance: She stands easily. No veering to one side is noted. No leaning to one side is noted. Posture is age-appropriate and stance is narrow based. Gait shows normal stride length and normal pace. No problems turning are noted.   Assessment and Plan:  In summary, Dawn Carlson is a very pleasant 73 y.o.-year old female with an underlying medical history of thyroid  disease, stroke in May 2025, glaucoma, hypertension, and severe obesity with a BMI of over 40, whose history and physical exam are concerning for sleep disordered breathing, particularly obstructive sleep apnea (OSA). A laboratory attended sleep study is typically considered gold standard for evaluation of sleep disordered breathing.   I had a long chat with the patient about my findings and the diagnosis of sleep apnea, particularly OSA, its prognosis and treatment options. We talked about medical/conservative treatments, surgical interventions and non-pharmacological approaches for symptom control. I explained, in particular, the risks and ramifications of untreated moderate to severe OSA, especially with respect to developing cardiovascular disease down the road, including congestive heart failure (CHF), difficult to treat hypertension, cardiac arrhythmias (particularly A-fib), neurovascular complications including TIA, stroke and dementia. Even type 2 diabetes has, in part, been linked to untreated OSA. Symptoms of untreated OSA may include (but may not be limited to) daytime sleepiness, nocturia (i.e. frequent  nighttime urination), memory problems, mood irritability and suboptimally controlled or worsening mood disorder such as depression and/or anxiety, lack of energy, lack of motivation, physical discomfort, as well as recurrent headaches, especially morning or nocturnal headaches. We talked about the  importance of maintaining a healthy lifestyle and striving for healthy weight.  In addition, we talked about the importance of striving for and maintaining good sleep hygiene. I recommended a sleep study at this time. I outlined the differences between a laboratory attended sleep study which is considered more comprehensive and accurate over the option of a home sleep test (HST); the latter may lead to underestimation of sleep disordered breathing in some instances and does not help with diagnosing upper airway resistance syndrome and is not accurate enough to diagnose primary central sleep apnea typically. I outlined possible surgical and non-surgical treatment options of OSA, including the use of a positive airway pressure (PAP) device (i.e. CPAP, AutoPAP/APAP or BiPAP in certain circumstances), a custom-made dental device (aka oral appliance, which would require a referral to a specialist dentist or orthodontist typically, and is generally speaking not considered for patients with full dentures or edentulous state), upper airway surgical options, such as traditional UPPP (which is not considered a first-line treatment) or the Inspire device (hypoglossal nerve stimulator, which would involve a referral for consultation with an ENT surgeon, after careful selection, following inclusion criteria - also not first-line treatment). I explained the PAP treatment option to the patient in detail, as this is generally considered first-line treatment.  The patient indicated that she would be willing to try PAP therapy, if the need arises. I explained the importance of being compliant with PAP treatment, not only for insurance  purposes but primarily to improve patient's symptoms symptoms, and for the patient's long term health benefit, including to reduce Her cardiovascular risks longer-term.    We will pick up our discussion about the next steps and treatment options after testing.  We will keep her posted as to the test results by phone call and/or MyChart messaging where possible.  We will plan to follow-up in sleep clinic accordingly as well.  I answered all her questions today and the patient was in agreement.   I encouraged her to call with any interim questions, concerns, problems or updates or email us  through MyChart.  Generally speaking, sleep test authorizations may take up to 2 weeks, sometimes less, sometimes longer, the patient is encouraged to get in touch with us  if they do not hear back from the sleep lab staff directly within the next 2 weeks.  Thank you very much for allowing me to participate in the care of this nice patient. If I can be of any further assistance to you please do not hesitate to call me at [phone].  Sincerely,   True Mar, MD, PhD

## 2023-12-29 ENCOUNTER — Ambulatory Visit

## 2023-12-31 ENCOUNTER — Ambulatory Visit

## 2024-01-02 ENCOUNTER — Telehealth: Payer: Self-pay | Admitting: Neurology

## 2024-01-02 NOTE — Telephone Encounter (Signed)
 [employer] NPSG pending.

## 2024-01-06 ENCOUNTER — Ambulatory Visit (INDEPENDENT_AMBULATORY_CARE_PROVIDER_SITE_OTHER): Admitting: *Deleted

## 2024-01-06 ENCOUNTER — Encounter (HOSPITAL_BASED_OUTPATIENT_CLINIC_OR_DEPARTMENT_OTHER): Payer: Self-pay

## 2024-01-06 DIAGNOSIS — Z780 Asymptomatic menopausal state: Secondary | ICD-10-CM | POA: Diagnosis not present

## 2024-01-06 DIAGNOSIS — Z0000 Encounter for general adult medical examination without abnormal findings: Secondary | ICD-10-CM | POA: Diagnosis not present

## 2024-01-06 NOTE — Patient Instructions (Signed)
 Dawn Carlson , Thank you for taking time to come for your Medicare Wellness Visit. I appreciate your ongoing commitment to your health goals. Please review the following plan we discussed and let me know if I can assist you in the future.   Screening recommendations/referrals: Colonoscopy: Due 09/13/2028 Mammogram: Completed Bone Density: Ordered Recommended yearly ophthalmology/optometry visit for glaucoma screening and checkup Recommended yearly dental visit for hygiene and checkup  Vaccinations: Influenza vaccine: Due in late August Pneumococcal vaccine: Completed Tdap vaccine: Completed Shingles vaccine: Due now    Advanced directives: Copy requested  Conditions/risks identified: Falls  Next appointment: 1 year   Preventive Care 40-64 Years, Female Preventive care refers to lifestyle choices and visits with your health care provider that can promote health and wellness. What does preventive care include? A yearly physical exam. This is also called an annual well check. Dental exams once or twice a year. Routine eye exams. Ask your health care provider how often you should have your eyes checked. Personal lifestyle choices, including: Daily care of your teeth and gums. Regular physical activity. Eating a healthy diet. Avoiding tobacco and drug use. Limiting alcohol use. Practicing safe sex. Taking low-dose aspirin  daily starting at age 64. Taking vitamin and mineral supplements as recommended by your health care provider. What happens during an annual well check? The services and screenings done by your health care provider during your annual well check will depend on your age, overall health, lifestyle risk factors, and family history of disease. Counseling  Your health care provider may ask you questions about your: Alcohol use. Tobacco use. Drug use. Emotional well-being. Home and relationship well-being. Sexual activity. Eating habits. Work and work  Astronomer. Method of birth control. Menstrual cycle. Pregnancy history. Screening  You may have the following tests or measurements: Height, weight, and BMI. Blood pressure. Lipid and cholesterol levels. These may be checked every 5 years, or more frequently if you are over 16 years old. Skin check. Lung cancer screening. You may have this screening every year starting at age 56 if you have a 30-pack-year history of smoking and currently smoke or have quit within the past 15 years. Fecal occult blood test (FOBT) of the stool. You may have this test every year starting at age 4. Flexible sigmoidoscopy or colonoscopy. You may have a sigmoidoscopy every 5 years or a colonoscopy every 10 years starting at age 82. Hepatitis C blood test. Hepatitis B blood test. Sexually transmitted disease (STD) testing. Diabetes screening. This is done by checking your blood sugar (glucose) after you have not eaten for a while (fasting). You may have this done every 1-3 years. Mammogram. This may be done every 1-2 years. Talk to your health care provider about when you should start having regular mammograms. This may depend on whether you have a family history of breast cancer. BRCA-related cancer screening. This may be done if you have a family history of breast, ovarian, tubal, or peritoneal cancers. Pelvic exam and Pap test. This may be done every 3 years starting at age 62. Starting at age 4, this may be done every 5 years if you have a Pap test in combination with an HPV test. Bone density scan. This is done to screen for osteoporosis. You may have this scan if you are at high risk for osteoporosis. Discuss your test results, treatment options, and if necessary, the need for more tests with your health care provider. Vaccines  Your health care provider may recommend certain vaccines, such  as: Influenza vaccine. This is recommended every year. Tetanus, diphtheria, and acellular pertussis (Tdap, Td)  vaccine. You may need a Td booster every 10 years. Zoster vaccine. You may need this after age 51. Pneumococcal 13-valent conjugate (PCV13) vaccine. You may need this if you have certain conditions and were not previously vaccinated. Pneumococcal polysaccharide (PPSV23) vaccine. You may need one or two doses if you smoke cigarettes or if you have certain conditions. Talk to your health care provider about which screenings and vaccines you need and how often you need them. This information is not intended to replace advice given to you by your health care provider. Make sure you discuss any questions you have with your health care provider. Document Released: 06/30/2015 Document Revised: 02/21/2016 Document Reviewed: 04/04/2015 Elsevier Interactive Patient Education  2017 [employer].    Fall Prevention in the Home Falls can cause injuries. They can happen to people of all ages. There are many things you can do to make your home safe and to help prevent falls. What can I do on the outside of my home? Regularly fix the edges of walkways and driveways and fix any cracks. Remove anything that might make you trip as you walk through a door, such as a raised step or threshold. Trim any bushes or trees on the path to your home. Use bright outdoor lighting. Clear any walking paths of anything that might make someone trip, such as rocks or tools. Regularly check to see if handrails are loose or broken. Make sure that both sides of any steps have handrails. Any raised decks and porches should have guardrails on the edges. Have any leaves, snow, or ice cleared regularly. Use sand or salt on walking paths during winter. Clean up any spills in your garage right away. This includes oil or grease spills. What can I do in the bathroom? Use night lights. Install grab bars by the toilet and in the tub and shower. Do not use towel bars as grab bars. Use non-skid mats or decals in the tub or shower. If you  need to sit down in the shower, use a plastic, non-slip stool. Keep the floor dry. Clean up any water that spills on the floor as soon as it happens. Remove soap buildup in the tub or shower regularly. Attach bath mats securely with double-sided non-slip rug tape. Do not have throw rugs and other things on the floor that can make you trip. What can I do in the bedroom? Use night lights. Make sure that you have a light by your bed that is easy to reach. Do not use any sheets or blankets that are too big for your bed. They should not hang down onto the floor. Have a firm chair that has side arms. You can use this for support while you get dressed. Do not have throw rugs and other things on the floor that can make you trip. What can I do in the kitchen? Clean up any spills right away. Avoid walking on wet floors. Keep items that you use a lot in easy-to-reach places. If you need to reach something above you, use a strong step stool that has a grab bar. Keep electrical cords out of the way. Do not use floor polish or wax that makes floors slippery. If you must use wax, use non-skid floor wax. Do not have throw rugs and other things on the floor that can make you trip. What can I do with my stairs? Do not leave  any items on the stairs. Make sure that there are handrails on both sides of the stairs and use them. Fix handrails that are broken or loose. Make sure that handrails are as long as the stairways. Check any carpeting to make sure that it is firmly attached to the stairs. Fix any carpet that is loose or worn. Avoid having throw rugs at the top or bottom of the stairs. If you do have throw rugs, attach them to the floor with carpet tape. Make sure that you have a light switch at the top of the stairs and the bottom of the stairs. If you do not have them, ask someone to add them for you. What else can I do to help prevent falls? Wear shoes that: Do not have high heels. Have rubber  bottoms. Are comfortable and fit you well. Are closed at the toe. Do not wear sandals. If you use a stepladder: Make sure that it is fully opened. Do not climb a closed stepladder. Make sure that both sides of the stepladder are locked into place. Ask someone to hold it for you, if possible. Clearly mark and make sure that you can see: Any grab bars or handrails. First and last steps. Where the edge of each step is. Use tools that help you move around (mobility aids) if they are needed. These include: Canes. Walkers. Scooters. Crutches. Turn on the lights when you go into a dark area. Replace any light bulbs as soon as they burn out. Set up your furniture so you have a clear path. Avoid moving your furniture around. If any of your floors are uneven, fix them. If there are any pets around you, be aware of where they are. Review your medicines with your doctor. Some medicines can make you feel dizzy. This can increase your chance of falling. Ask your doctor what other things that you can do to help prevent falls. This information is not intended to replace advice given to you by your health care provider. Make sure you discuss any questions you have with your health care provider. Document Released: 03/30/2009 Document Revised: 11/09/2015 Document Reviewed: 07/08/2014 Elsevier Interactive Patient Education  2017 [employer].

## 2024-01-06 NOTE — Progress Notes (Signed)
 Subjective:   Dawn Carlson is a 73 y.o. female who presents for Medicare Annual (Subsequent) preventive examination.  Visit Complete: Virtual I connected with  Dawn Carlson on 01/06/24 by a audio enabled telemedicine application and verified that I am speaking with the correct person using two identifiers.  Patient Location: Home  Provider Location: Office/Clinic  I discussed the limitations of evaluation and management by telemedicine. The patient expressed understanding and agreed to proceed.  Vital Signs: Because this visit was a virtual/telehealth visit, some criteria may be missing or patient reported. Any vitals not documented were not able to be obtained and vitals that have been documented are patient reported.  Patient Medicare AWV questionnaire was completed by the patient on 01/06/24; I have confirmed that all information answered by patient is correct and no changes since this date.        Objective:    There were no vitals filed for this visit. There is no height or weight on file to calculate BMI.     11/19/2023    9:38 AM 11/10/2023    2:43 PM 11/05/2022    1:21 PM 05/22/2022    4:15 PM 04/19/2021   10:59 AM 03/13/2017    7:09 PM  Advanced Directives  Does Patient Have a Medical Advance Directive? Yes No Yes No Yes No   Type of Best boy of [place];Living will  Living will;Healthcare Power of Attorney   Does patient want to make changes to medical advance directive? No - Patient declined    Yes (MAU/Ambulatory/Procedural Areas - Information given)   Copy of Healthcare Power of Attorney in Chart?   No - copy requested  No - copy requested   Would patient like information on creating a medical advance directive?  No - Patient declined  No - Patient declined       Data saved with a previous flowsheet row definition     Current Medications (verified) Outpatient Encounter Medications as of 01/06/2024  Medication Sig   amLODipine   (NORVASC ) 2.5 MG tablet Take 1 tablet (2.5 mg total) by mouth daily.   aspirin  81 MG chewable tablet Chew 1 tablet (81 mg total) by mouth daily.   latanoprost  (XALATAN ) 0.005 % ophthalmic solution Place 1 drop into affected eye(s) every evening.   rosuvastatin  (CRESTOR ) 40 MG tablet Take 1 tablet (40 mg total) by mouth at bedtime.   sodium chloride  (MURO 128) 5 % ophthalmic solution Place 1 drop into both eyes 2 (two) times daily.   No facility-administered encounter medications on file as of 01/06/2024.    Allergies (verified) Patient has no known allergies.   History: Past Medical History:  Diagnosis Date   Glaucoma 2024   Eye Right   Rash 03/12/2021   Stroke (HCC)    Thyroid  disease    Past Surgical History:  Procedure Laterality Date   ABDOMINAL HYSTERECTOMY  approximately 35 years ago   CARPAL TUNNEL RELEASE     Family History  Problem Relation Age of Onset   Hypertension Mother    Heart disease Mother    Diabetes Mother    ADD / ADHD Mother    Arthritis Mother    Vision loss Mother    ADD / ADHD Father    Hearing loss Father    Diabetes Sister    Diabetes Brother    Diabetes Other    Schizophrenia Grandson    ADD / ADHD Sister    Diabetes Sister  ADD / ADHD Sister    Arthritis Sister    COPD Sister    Diabetes Sister    Heart disease Sister    Kidney disease Sister    ADD / ADHD Brother    COPD Brother    ADD / ADHD Brother    COPD Brother    Diabetes Brother    Hearing loss Brother    Vision loss Brother    ADD / ADHD Brother    Diabetes Brother    Hearing loss Brother    Vision loss Brother    Diabetes Sister    Breast cancer Neg Hx    Social History   Socioeconomic History   Marital status: Divorced    Spouse name: Not on file   Number of children: Not on file   Years of education: Not on file   Highest education level: Not on file  Occupational History   Occupation: retired    Comment: Forensic scientist  Tobacco Use   Smoking status:  Never    Passive exposure: Never   Smokeless tobacco: Never  Vaping Use   Vaping status: Never Used  Substance and Sexual Activity   Alcohol use: No   Drug use: No   Sexual activity: Not Currently    Birth control/protection: Condom  Other Topics Concern   Not on file  Social History Narrative   Lives in her own home with adult grandson.    Divorced for over 30 years   Jehovah's Witness.    Social Drivers of Corporate investment banker Strain: Low Risk  (11/05/2022)   Overall Financial Resource Strain (CARDIA)    Difficulty of Paying Living Expenses: Not hard at all  Food Insecurity: No Food Insecurity (11/05/2022)   Hunger Vital Sign    Worried About Running Out of Food in the Last Year: Never true    Ran Out of Food in the Last Year: Never true  Transportation Needs: No Transportation Needs (11/05/2022)   PRAPARE - Administrator, Civil Service (Medical): No    Lack of Transportation (Non-Medical): No  Physical Activity: Sufficiently Active (11/05/2022)   Exercise Vital Sign    Days of Exercise per Week: 7 days    Minutes of Exercise per Session: 30 min  Stress: No Stress Concern Present (11/05/2022)   [employer] of Occupational Health - Occupational Stress Questionnaire    Feeling of Stress : Not at all  Social Connections: Socially Integrated (11/05/2022)   Social Connection and Isolation Panel    Frequency of Communication with Friends and Family: More than three times a week    Frequency of Social Gatherings with Friends and Family: More than three times a week    Attends Religious Services: More than 4 times per year    Active Member of [employer] or Organizations: Yes    Attends Engineer, structural: More than 4 times per year    Marital Status: Married    Tobacco Counseling Counseling given: Not Answered   Clinical Intake:                        Activities of Daily Living    01/03/2024    1:44 PM  In your present state of  health, do you have any difficulty performing the following activities:  Hearing? 0  Vision? 0  Difficulty concentrating or making decisions? 0  Walking or climbing stairs? 0  Dressing or bathing? 0  Doing  errands, shopping? 0  Preparing Food and eating ? N  Using the Toilet? N  In the past six months, have you accidently leaked urine? N  Do you have problems with loss of bowel control? N  Managing your Medications? N  Managing your Finances? N  Housekeeping or managing your Housekeeping? N    Patient Care Team: [place], Quintin PARAS, MD as PCP - General (Family Medicine)  Indicate any recent Medical Services you may have received from other than [facility] providers in the past year (date may be approximate).     Assessment:   This is a routine wellness examination for Dawn Carlson.  Hearing/Vision screen No results found.   Goals Addressed   None    Depression Screen    12/17/2023    1:05 PM 11/19/2023   10:42 AM 12/04/2022    8:35 AM 11/05/2022    1:18 PM 08/19/2022    9:37 AM 04/16/2022    1:40 PM 11/14/2021    8:56 AM  PHQ 2/9 Scores  PHQ - 2 Score 0 0 0 0 0 0 0  PHQ- 9 Score 0 0 0  0 0   Exception Documentation   Medical reason  Medical reason Medical reason Medical reason    Fall Risk    01/03/2024    1:44 PM 12/17/2023    1:05 PM 11/19/2023   10:41 AM 12/04/2022    8:35 AM 11/05/2022    1:21 PM  Fall Risk   Falls in the past year? 1 1 1  0 0  Number falls in past yr: 0 0 0 0 0  Injury with Fall? 0 1 1 0 0  Risk for fall due to :  History of fall(s) History of fall(s) No Fall Risks No Fall Risks  Follow up  Falls evaluation completed;Education provided;Falls prevention discussed Falls evaluation completed;Education provided;Falls prevention discussed Falls evaluation completed Falls prevention discussed    MEDICARE RISK AT HOME: Medicare Risk at Home Any stairs in or around the home?: (Patient-Rptd) Yes If so, are there any without handrails?: (Patient-Rptd) No Home free  of loose throw rugs in walkways, pet beds, electrical cords, etc?: (Patient-Rptd) No Adequate lighting in your home to reduce risk of falls?: (Patient-Rptd) Yes Life alert?: (Patient-Rptd) No Use of a cane, walker or w/c?: (Patient-Rptd) No Grab bars in the bathroom?: (Patient-Rptd) No Shower chair or bench in shower?: (Patient-Rptd) No Elevated toilet seat or a handicapped toilet?: (Patient-Rptd) No  TIMED UP AND GO:  Was the test performed?  No    Cognitive Function:        11/05/2022    1:23 PM 04/19/2021    3:15 PM  6CIT Screen  What Year? 0 points 0 points  What month? 0 points 0 points  What time? 0 points 0 points  Count back from 20 0 points 0 points  Months in reverse 0 points 0 points  Repeat phrase 0 points 0 points  Total Score 0 points 0 points    Immunizations Immunization History  Administered Date(s) Administered   Fluad Quad(high Dose 65+) 04/02/2022   Fluad Trivalent(High Dose 65+) 03/24/2023   Influenza,inj,Quad PF,6+ Mos 03/12/2021   PFIZER(Purple Top)SARS-COV-2 Vaccination 09/09/2019, 10/04/2019   Pfizer Covid-19 Vaccine Bivalent Booster 41yrs & up 06/12/2020, 01/15/2021   Pfizer(Comirnaty )Fall Seasonal Vaccine 12 years and older 03/31/2023   Pneumococcal Conjugate-13 08/25/2018   Pneumococcal Polysaccharide-23 04/19/2021   Td 01/26/2018    TDAP status: Up to date  Flu Vaccine status: Up to date  Pneumococcal vaccine status: Up to date  Covid-19 vaccine status: Completed vaccines  Qualifies for Shingles Vaccine? Yes   Zostavax completed No   Shingrix  Completed?: No.    Education has been provided regarding the importance of this vaccine. Patient has been advised to call insurance company to determine out of pocket expense if they have not yet received this vaccine. Advised may also receive vaccine at local pharmacy or Health Dept. Verbalized acceptance and understanding.  Screening Tests Health Maintenance  Topic Date Due   Zoster Vaccines-  Shingrix  (1 of 2) Never done   COVID-19 Vaccine (6 - 2024-25 season) 09/29/2023   INFLUENZA VACCINE  01/16/2024   Medicare Annual Wellness (AWV)  01/05/2025   MAMMOGRAM  12/17/2025   DTaP/Tdap/Td (2 - Tdap) 01/27/2028   Colonoscopy  09/13/2028   Pneumococcal Vaccine: 50+ Years  Completed   DEXA SCAN  Completed   Hepatitis C Screening  Completed   Hepatitis B Vaccines  Aged Out   HPV VACCINES  Aged Out   Meningococcal B Vaccine  Aged Out    Health Maintenance  Health Maintenance Due  Topic Date Due   Zoster Vaccines- Shingrix  (1 of 2) Never done   COVID-19 Vaccine (6 - 2024-25 season) 09/29/2023    Colorectal cancer screening: Type of screening: Colonoscopy. Completed 09/04/2018. Repeat every 10 years  Mammogram status: Completed 12/18/23. Repeat every year  Bone Density status: Ordered 01/06/24. Pt provided with contact info and advised to call to schedule appt.  Lung Cancer Screening: (Low Dose CT Chest recommended if Age 29-80 years, 20 pack-year currently smoking OR have quit w/in 15years.) does not qualify.   Lung Cancer Screening Referral: NA  Additional Screening:  Hepatitis C Screening: does qualify; Completed   Vision Screening: Recommended annual ophthalmology exams for early detection of glaucoma and other disorders of the eye. Is the patient up to date with their annual eye exam?  Yes  Who is the provider or what is the name of the office in which the patient attends annual eye exams? [facility] [address] [place] If pt is not established with a provider, would they like to be referred to a provider to establish care? No .   Dental Screening: Recommended annual dental exams for proper oral hygiene   Community Resource Referral / Chronic Care Management: CRR required this visit?  No   CCM required this visit?  No     Plan:     I have personally reviewed and noted the following in the patient's chart:   Medical and social history Use of alcohol,  tobacco or illicit drugs  Current medications and supplements including opioid prescriptions. Patient is not currently taking opioid prescriptions. Functional ability and status Nutritional status Physical activity Advanced directives List of other physicians Hospitalizations, surgeries, and ER visits in previous 12 months Vitals Screenings to include cognitive, depression, and falls Referrals and appointments  In addition, I have reviewed and discussed with patient certain preventive protocols, quality metrics, and best practice recommendations. A written personalized care plan for preventive services as well as general preventive health recommendations were provided to patient.     Kerri JONELLE Fuel, CMA   01/06/2024   After Visit Summary: (MyChart) Due to this being a telephonic visit, the after visit summary with patients personalized plan was offered to patient via MyChart   Nurse Notes:  Ms. Polinsky , Thank you for taking time to come for your Medicare Wellness Visit. I appreciate your ongoing commitment to  your health goals. Please review the following plan we discussed and let me know if I can assist you in the future.   These are the goals we discussed:  Goals      Patient Stated     She would like to work on getting her grandson mental health care and create a stable home environment.  She would like to loose some weight. She would like to increase her water intake.      Patient Stated     Patient states her goal is to lose weight        This is a list of the screening recommended for you and due dates:  Health Maintenance  Topic Date Due   Zoster (Shingles) Vaccine (1 of 2) Never done   COVID-19 Vaccine (6 - 2024-25 season) 09/29/2023   Flu Shot  01/16/2024   Medicare Annual Wellness Visit  01/05/2025   Mammogram  12/17/2025   DTaP/Tdap/Td vaccine (2 - Tdap) 01/27/2028   Colon Cancer Screening  09/13/2028   Pneumococcal Vaccine for age over 68  Completed   DEXA  scan (bone density measurement)  Completed   Hepatitis C Screening  Completed   Hepatitis B Vaccine  Aged Out   HPV Vaccine  Aged Out   Meningitis B Vaccine  Aged Out

## 2024-01-06 NOTE — Addendum Note (Signed)
 Addended by: HERMINE LATUS R on: 01/06/2024 02:42 PM   Modules accepted: Orders

## 2024-01-08 NOTE — Telephone Encounter (Signed)
 Checked status with [employer] it is still pending.

## 2024-01-13 ENCOUNTER — Encounter (HOSPITAL_BASED_OUTPATIENT_CLINIC_OR_DEPARTMENT_OTHER): Payer: PRIVATE HEALTH INSURANCE

## 2024-01-13 NOTE — Telephone Encounter (Signed)
 NPSG [employer] shara: [id number] (exp. 01/02/24 to 03/30/24) & [employer] no auth req spoke to Gio H on 01/05/24.

## 2024-01-14 NOTE — Telephone Encounter (Signed)
 NPSG-- Mylene barrows: [id number] (exp. 01/02/24 to 03/30/24)   Patient is scheduled at [facility] for 02/10/24 at 8 pm.  Mailed packet and sent mychart.

## 2024-01-16 ENCOUNTER — Encounter: Payer: Self-pay | Admitting: Gastroenterology

## 2024-01-27 DIAGNOSIS — H401131 Primary open-angle glaucoma, bilateral, mild stage: Secondary | ICD-10-CM | POA: Diagnosis not present

## 2024-02-10 ENCOUNTER — Ambulatory Visit (INDEPENDENT_AMBULATORY_CARE_PROVIDER_SITE_OTHER): Admitting: Neurology

## 2024-02-10 DIAGNOSIS — Z9189 Other specified personal risk factors, not elsewhere classified: Secondary | ICD-10-CM

## 2024-02-10 DIAGNOSIS — G4733 Obstructive sleep apnea (adult) (pediatric): Secondary | ICD-10-CM

## 2024-02-10 DIAGNOSIS — R351 Nocturia: Secondary | ICD-10-CM

## 2024-02-10 DIAGNOSIS — Z820 Family history of epilepsy and other diseases of the nervous system: Secondary | ICD-10-CM

## 2024-02-10 DIAGNOSIS — R0683 Snoring: Secondary | ICD-10-CM | POA: Diagnosis not present

## 2024-02-10 DIAGNOSIS — R0681 Apnea, not elsewhere classified: Secondary | ICD-10-CM

## 2024-02-10 DIAGNOSIS — R519 Headache, unspecified: Secondary | ICD-10-CM

## 2024-02-10 DIAGNOSIS — Z8673 Personal history of transient ischemic attack (TIA), and cerebral infarction without residual deficits: Secondary | ICD-10-CM

## 2024-02-10 DIAGNOSIS — G4720 Circadian rhythm sleep disorder, unspecified type: Secondary | ICD-10-CM

## 2024-02-18 ENCOUNTER — Other Ambulatory Visit (HOSPITAL_COMMUNITY): Payer: Self-pay

## 2024-02-18 ENCOUNTER — Other Ambulatory Visit (HOSPITAL_BASED_OUTPATIENT_CLINIC_OR_DEPARTMENT_OTHER): Payer: Self-pay | Admitting: Family Medicine

## 2024-02-18 ENCOUNTER — Other Ambulatory Visit (HOSPITAL_BASED_OUTPATIENT_CLINIC_OR_DEPARTMENT_OTHER): Payer: Self-pay

## 2024-02-18 MED ORDER — ROSUVASTATIN CALCIUM 40 MG PO TABS
40.0000 mg | ORAL_TABLET | Freq: Every day | ORAL | 3 refills | Status: AC
  Filled 2024-02-18: qty 90, 90d supply, fill #0
  Filled 2024-05-11: qty 90, 90d supply, fill #1

## 2024-02-18 NOTE — Procedures (Unsigned)
 Physician Interpretation: Please see link under Procedure Tab or under Encounters tab for physician report, technical report, as well as O2 titration and/or PAP titration tables (if applicable).   Referred by: Dr. Modena Callander   History and Indication for Testing (obtained from visit note dated 12/18/2023): 73 year old female with an underlying complex medical history of stroke in May 2025, hypertension, hyperlipidemia, coronary artery disease, prior smoking, COPD, history of kidney stone, carotid artery disease with status post bilateral carotid endarterectomies, hypothyroidism, and overweight state, who reports snoring and nocturia. His Epworth sleepiness score is 3 out of 24, fatigue severity score is 15 out of 63.     Review of the EEG showed no abnormal electrical discharges and symmetrical bihemispheric findings.     EKG: The EKG revealed normal sinus rhythm (NSR). ***   AUDIO/VIDEO REVIEW: The audio and video review did not show any abnormal or unusual behaviors, movements, phonations or vocalizations. The patient took *** restroom breaks. Snoring was noted, ***   POST-STUDY QUESTIONNAIRE: Post study, the patient indicated, that sleep was *** the same as usual.    IMPRESSION:    Obstructive Sleep Apnea (OSA), *** ***Central Sleep Apnea (CSA) ***Primary Snoring ***Primary Central Sleep Apnea ***Complex Sleep Apnea ***PLMD (periodic limb movement disorder [of sleep]) ***Dysfunctions associated with sleep stages or arousal from sleep ***Non-specific abnormal electrocardiogram (EKG) ***Poor sleep pattern ***Inconclusive Test   RECOMMENDATIONS:         I certify that I have reviewed the entire raw data recording prior to the issuance of this report in accordance with the Standards of Accreditation of the American Academy of Sleep Medicine (AASM).   True Mar, MD, PhD Medical Director, [facility]) Diplomat, ABPN (Neurology and  Sleep)

## 2024-02-19 ENCOUNTER — Other Ambulatory Visit (HOSPITAL_BASED_OUTPATIENT_CLINIC_OR_DEPARTMENT_OTHER): Payer: Self-pay

## 2024-02-20 ENCOUNTER — Ambulatory Visit: Payer: Self-pay | Admitting: Neurology

## 2024-02-20 DIAGNOSIS — G4733 Obstructive sleep apnea (adult) (pediatric): Secondary | ICD-10-CM

## 2024-03-17 ENCOUNTER — Ambulatory Visit (INDEPENDENT_AMBULATORY_CARE_PROVIDER_SITE_OTHER): Admitting: Family Medicine

## 2024-03-17 ENCOUNTER — Encounter (HOSPITAL_BASED_OUTPATIENT_CLINIC_OR_DEPARTMENT_OTHER): Payer: Self-pay | Admitting: Family Medicine

## 2024-03-17 ENCOUNTER — Other Ambulatory Visit (HOSPITAL_BASED_OUTPATIENT_CLINIC_OR_DEPARTMENT_OTHER): Payer: Self-pay

## 2024-03-17 VITALS — BP 130/84 | HR 75 | Ht 66.0 in | Wt 239.4 lb

## 2024-03-17 DIAGNOSIS — E785 Hyperlipidemia, unspecified: Secondary | ICD-10-CM | POA: Diagnosis not present

## 2024-03-17 DIAGNOSIS — R7303 Prediabetes: Secondary | ICD-10-CM

## 2024-03-17 DIAGNOSIS — I10 Essential (primary) hypertension: Secondary | ICD-10-CM | POA: Diagnosis not present

## 2024-03-17 DIAGNOSIS — E039 Hypothyroidism, unspecified: Secondary | ICD-10-CM

## 2024-03-17 MED ORDER — AMLODIPINE BESYLATE 2.5 MG PO TABS
2.5000 mg | ORAL_TABLET | Freq: Every day | ORAL | 1 refills | Status: DC
  Filled 2024-03-17 – 2024-05-11 (×2): qty 90, 90d supply, fill #0

## 2024-03-17 NOTE — Assessment & Plan Note (Signed)
 Patient continues with rosuvastatin .  Denies any issues with medication. We will continue with current regimen, no dose change today.  Will plan to check cholesterol panel with labs before next appointment

## 2024-03-17 NOTE — Patient Instructions (Signed)
  Medication Instructions:  Your physician recommends that you continue on your current medications as directed. Please refer to the Current Medication list given to you today. --If you need a refill on any your medications before your next appointment, please call your pharmacy first. If no refills are authorized on file call the office.-- Lab Work: Your physician has recommended that you have lab work today: labs 1 week before next visit  If you have labs (blood work) drawn today and your tests are completely normal, you will receive your results via MyChart message OR a phone call from our staff.  Please ensure you check your voicemail in the event that you authorized detailed messages to be left on a delegated number. If you have any lab test that is abnormal or we need to change your treatment, we will call you to review the results.   Follow-Up: Your next appointment:   Your physician recommends that you schedule a follow-up appointment in: 3-4 months  with Dr. de Peru  You will receive a text message or e-mail with a link to a survey about your care and experience with us  today! We would greatly appreciate your feedback!   Thanks for letting us  be apart of your health journey!!  Primary Care and Sports Medicine   Dr. Quintin sheerer Peru   We encourage you to activate your patient portal called MyChart.  Sign up information is provided on this After Visit Summary.  MyChart is used to connect with patients for Virtual Visits (Telemedicine).  Patients are able to view lab/test results, encounter notes, upcoming appointments, etc.  Non-urgent messages can be sent to your provider as well. To learn more about what you can do with MyChart, please visit --  [email or web address].

## 2024-03-17 NOTE — Assessment & Plan Note (Signed)
 Patient previously had come off of levothyroxine  and TSH has remained within normal range.  She continues without levothyroxine  at this time.  We will plan to recheck TSH with labs before next appointment.

## 2024-03-17 NOTE — Assessment & Plan Note (Signed)
 Blood pressure borderline in office today.  She continues with amlodipine  at low-dose, denies any issues with medication.  She has been checking her blood pressure and readings at home have been similar to that in the office today. At this time, can continue with intermittent monitoring of blood pressure at home, DASH diet.  No changes to medication today.  Discussed potential increase in dose of amlodipine  from 2.5 mg up to 5 mg if blood pressure continues to be borderline or slightly elevated

## 2024-03-17 NOTE — Assessment & Plan Note (Signed)
 Prior hemoglobin A1c has been stable within prediabetes range.  She was utilizing Ozempic  at 1 point in the past, however this was no longer being covered by insurance and so she has stopped taking this. She continues with lifestyle modifications.  No new symptoms such as polyuria or polydipsia. Recommend continuing with lifestyle modifications, we will check hemoglobin A1c shortly before next appointment

## 2024-03-17 NOTE — Progress Notes (Signed)
    Procedures performed today:    None.  Independent interpretation of notes and tests performed by another provider:   None.  Brief History, Exam, Impression, and Recommendations:    BP 130/84 (BP Location: Right Arm, Patient Position: Sitting, Cuff Size: Normal)   Pulse 75   Ht 5' 6 (1.676 m)   Wt 239 lb 6.4 oz (108.6 kg)   SpO2 98%   BMI 38.64 kg/m   Primary hypertension Assessment & Plan: Blood pressure borderline in office today.  She continues with amlodipine  at low-dose, denies any issues with medication.  She has been checking her blood pressure and readings at home have been similar to that in the office today. At this time, can continue with intermittent monitoring of blood pressure at home, DASH diet.  No changes to medication today.  Discussed potential increase in dose of amlodipine  from 2.5 mg up to 5 mg if blood pressure continues to be borderline or slightly elevated  Orders: -     CBC with Differential/Platelet; Future -     Comprehensive metabolic panel with GFR; Future  Hypothyroidism, unspecified type Assessment & Plan: Patient previously had come off of levothyroxine  and TSH has remained within normal range.  She continues without levothyroxine  at this time.  We will plan to recheck TSH with labs before next appointment.  Orders: -     TSH Rfx on Abnormal to Free T4; Future  Hyperlipidemia, unspecified hyperlipidemia type Assessment & Plan: Patient continues with rosuvastatin .  Denies any issues with medication. We will continue with current regimen, no dose change today.  Will plan to check cholesterol panel with labs before next appointment  Orders: -     Lipid panel; Future  Pre-diabetes Assessment & Plan: Prior hemoglobin A1c has been stable within prediabetes range.  She was utilizing Ozempic  at 1 point in the past, however this was no longer being covered by insurance and so she has stopped taking this. She continues with lifestyle  modifications.  No new symptoms such as polyuria or polydipsia. Recommend continuing with lifestyle modifications, we will check hemoglobin A1c shortly before next appointment  Orders: -     Hemoglobin A1c; Future  Other orders -     amLODIPine  Besylate; Take 1 tablet (2.5 mg total) by mouth daily.  Dispense: 90 tablet; Refill: 1  Return in about 3 months (around 06/17/2024).   ___________________________________________ Dawn Hietala de Peru, MD, ABFM, CAQSM Primary Care and Sports Medicine [facility]

## 2024-03-19 ENCOUNTER — Ambulatory Visit (HOSPITAL_BASED_OUTPATIENT_CLINIC_OR_DEPARTMENT_OTHER): Admitting: Family Medicine

## 2024-03-23 ENCOUNTER — Encounter: Payer: Self-pay | Admitting: Gastroenterology

## 2024-03-23 ENCOUNTER — Other Ambulatory Visit (HOSPITAL_BASED_OUTPATIENT_CLINIC_OR_DEPARTMENT_OTHER): Payer: Self-pay

## 2024-03-23 ENCOUNTER — Ambulatory Visit (INDEPENDENT_AMBULATORY_CARE_PROVIDER_SITE_OTHER): Admitting: Gastroenterology

## 2024-03-23 VITALS — BP 122/80 | HR 79 | Ht 66.0 in | Wt 240.1 lb

## 2024-03-23 DIAGNOSIS — R12 Heartburn: Secondary | ICD-10-CM | POA: Insufficient documentation

## 2024-03-23 DIAGNOSIS — K219 Gastro-esophageal reflux disease without esophagitis: Secondary | ICD-10-CM | POA: Diagnosis not present

## 2024-03-23 DIAGNOSIS — Z860100 Personal history of colon polyps, unspecified: Secondary | ICD-10-CM | POA: Diagnosis not present

## 2024-03-23 DIAGNOSIS — R09A2 Foreign body sensation, throat: Secondary | ICD-10-CM | POA: Diagnosis not present

## 2024-03-23 DIAGNOSIS — R1110 Vomiting, unspecified: Secondary | ICD-10-CM | POA: Insufficient documentation

## 2024-03-23 DIAGNOSIS — R630 Anorexia: Secondary | ICD-10-CM | POA: Insufficient documentation

## 2024-03-23 MED ORDER — ESOMEPRAZOLE MAGNESIUM 40 MG PO CPDR
40.0000 mg | DELAYED_RELEASE_CAPSULE | Freq: Two times a day (BID) | ORAL | 2 refills | Status: DC
  Filled 2024-03-23: qty 60, 30d supply, fill #0
  Filled 2024-04-26: qty 30, 15d supply, fill #1

## 2024-03-23 NOTE — Progress Notes (Signed)
 GASTROENTEROLOGY OUTPATIENT CLINIC VISIT   Primary Care Provider de Peru, Quintin PARAS, MD [address] [place] [address] [phone]  Referring Provider de Peru, Raymond J, MD [address] [place],  [place] [address] [phone]  Patient Profile: Dawn Carlson is a 73 y.o. female with a pmh significant for CVA (on ASA), HTN, Glaucoma, Colon polyps.  The patient presents to the [facility] for an evaluation and management of problem(s) noted below:  Problem List 1. Globus sensation   2. Anorexia   3. Regurgitation of food   4. History of colonic polyps    Discussed the use of AI scribe software for clinical note transcription with the patient, who gave verbal consent to proceed.  History of Present Illness This is the patient's first visit to the [facility] GI clinic.  Dawn Carlson is a 73 year old female who presents with globus and regurgitation and anorexia.  She experienced her first diagnosed stroke in May 2025, but imaging suggested that she may have had prior ischemic-like strokes.  She was on Plavix  and then transitioned to aspirin .  She has done well over the last few months and has no residual effects from the CVA.  What she has noted since her stroke is symptoms of globus as well as regurgitation.  Globus is every day.  Regurgitation she feels infrequently.  She also has early satiety and had anorexia.  However, no weight loss has occurred.  She is not taking anything currently for GERD OTC.  She denies overt dysphagia or odynophagia.  This sensation does not worsen with solid foods.  No changes in bowel habits other than slightly less frequent which she attributes to not eating as much.  She has not noted any blood in her stools.  She has not undergone any prior swallow tests or upper endoscopy.  Previous colonoscopy had multiple polyps removed (not clear when her follow-up is due as pathology was not able to be reviewed).   GI Review of  Systems Positive as above Negative for melena, hematochezia  Review of Systems General: Denies fevers/chills/weight loss unintentionally Cardiovascular: Denies chest pain Pulmonary: Denies shortness of breath Gastroenterological: See HPI Genitourinary: Denies darkened urine Hematological: Denies easy bruising/bleeding Dermatological: Denies jaundice Psychological: Mood is stable  Medications Current Outpatient Medications  Medication Sig Dispense Refill   amLODipine  (NORVASC ) 2.5 MG tablet Take 1 tablet (2.5 mg total) by mouth daily. 90 tablet 1   aspirin  81 MG chewable tablet Chew 1 tablet (81 mg total) by mouth daily.     latanoprost  (XALATAN ) 0.005 % ophthalmic solution Place 1 drop into affected eye(s) every evening. 7.5 mL 11   rosuvastatin  (CRESTOR ) 40 MG tablet Take 1 tablet (40 mg total) by mouth at bedtime. 90 tablet 3   sodium chloride  (MURO 128) 5 % ophthalmic solution Place 1 drop into both eyes 2 (two) times daily. 15 mL 5   No current facility-administered medications for this visit.    Allergies No Known Allergies  Histories Past Medical History:  Diagnosis Date   Glaucoma 2024   Eye Right   Stroke [facility])    Thyroid  disease    Past Surgical History:  Procedure Laterality Date   ABDOMINAL HYSTERECTOMY  approximately 35 years ago   CARPAL TUNNEL RELEASE     Social History   Socioeconomic History   Marital status: Divorced    Spouse name: Not on file   Number of children: Not on file   Years of education: Not on file  Highest education level: Not on file  Occupational History   Occupation: retired    Comment: Forensic scientist  Tobacco Use   Smoking status: Never    Passive exposure: Never   Smokeless tobacco: Never  Vaping Use   Vaping status: Never Used  Substance and Sexual Activity   Alcohol use: No   Drug use: No   Sexual activity: Not Currently    Birth control/protection: Condom  Other Topics Concern   Not on file  Social History  Narrative   Lives in her own home with adult grandson.    Divorced for over 30 years   Jehovah's Witness.    Social Drivers of Corporate investment banker Strain: Low Risk  (01/06/2024)   Overall Financial Resource Strain (CARDIA)    Difficulty of Paying Living Expenses: Not hard at all  Food Insecurity: No Food Insecurity (01/06/2024)   Hunger Vital Sign    Worried About Running Out of Food in the Last Year: Never true    Ran Out of Food in the Last Year: Never true  Transportation Needs: No Transportation Needs (01/06/2024)   PRAPARE - Administrator, Civil Service (Medical): No    Lack of Transportation (Non-Medical): No  Physical Activity: Sufficiently Active (01/06/2024)   Exercise Vital Sign    Days of Exercise per Week: 7 days    Minutes of Exercise per Session: 30 min  Stress: No Stress Concern Present (01/06/2024)   [employer] of Occupational Health - Occupational Stress Questionnaire    Feeling of Stress: Not at all  Social Connections: Socially Integrated (01/06/2024)   Social Connection and Isolation Panel    Frequency of Communication with Friends and Family: More than three times a week    Frequency of Social Gatherings with Friends and Family: More than three times a week    Attends Religious Services: More than 4 times per year    Active Member of [employer] or Organizations: Yes    Attends Banker Meetings: More than 4 times per year    Marital Status: Married  Catering manager Violence: Not At Risk (01/06/2024)   Humiliation, Afraid, Rape, and Kick questionnaire    Fear of Current or Ex-Partner: No    Emotionally Abused: No    Physically Abused: No    Sexually Abused: No   Family History  Problem Relation Age of Onset   Hypertension Mother    Heart disease Mother    Diabetes Mother    ADD / ADHD Mother    Arthritis Mother    Vision loss Mother    ADD / ADHD Father    Hearing loss Father    Diabetes Sister    ADD / ADHD Sister     Diabetes Sister    ADD / ADHD Sister    Arthritis Sister    COPD Sister    Diabetes Sister    Heart disease Sister    Kidney disease Sister    Diabetes Sister    Diabetes Brother    ADD / ADHD Brother    COPD Brother    ADD / ADHD Brother    COPD Brother    Diabetes Brother    Hearing loss Brother    Vision loss Brother    ADD / ADHD Brother    Diabetes Brother    Hearing loss Brother    Vision loss Brother    Schizophrenia Grandson    Diabetes Other    Breast cancer  Neg Hx    Colon cancer Neg Hx    Esophageal cancer Neg Hx    Inflammatory bowel disease Neg Hx    Liver disease Neg Hx    Pancreatic cancer Neg Hx    Rectal cancer Neg Hx    Stomach cancer Neg Hx    I have reviewed her medical, social, and family history in detail and updated the electronic medical record as necessary.    PHYSICAL EXAMINATION  BP 122/80   Pulse 79   Ht 5' 6 (1.676 m)   Wt 240 lb 2 oz (108.9 kg)   BMI 38.76 kg/m  Wt Readings from Last 3 Encounters:  03/23/24 240 lb 2 oz (108.9 kg)  03/17/24 239 lb 6.4 oz (108.6 kg)  12/25/23 253 lb (114.8 kg)  GEN: NAD, appears stated age, doesn't appear chronically ill PSYCH: Cooperative, without pressured speech EYE: Conjunctivae pink, sclerae anicteric ENT: MMM CV: Nontachycardic RESP: No audible wheezing GI: NABS, soft, NT/ND, without rebound or guarding MSK/EXT: No significant lower extremity edema SKIN: No jaundice NEURO:  Alert & Oriented x 3, no focal deficits   REVIEW OF DATA  I reviewed the following data at the time of this encounter:  GI Procedures and Studies  2017 colonoscopy 4 sessile polyps ranging in size from 3 to 5 mm were found in the sigmoid colon.  Polypectomy performed. A single sessile 3 mm polyp found in the transverse colon.  Cold forceps resection. Follow-up to be dictated based on results  Laboratory Studies  Reviewed those in [facility]  Imaging Studies  No relevant studies to review   ASSESSMENT/PLAN  Ms.  Mcneely is a 73 y.o. female.  The patient is seen today for evaluation and management of:  1. Globus sensation   2. Anorexia   3. Regurgitation of food   4. History of colonic polyps    The patient is clinically and hemodynamically stable today.   Globus sensation Persistent sensation of throat fullness or choking, present 24 hours a day, without overt dysphagia or weight loss.  With symptoms post-CVA then wonder if this is related to such.  MBS with SLP may be worth considering in future.  Will begin work up with barium swallow to evaluate the differential of anatomical issues such as strictures or narrowing, but these are felt to be less likely given the absence of dysphagia.  She prefers a conservative approach before endoscopy. - Order barium swallow study to evaluate for anatomical abnormalities such as strictures or narrowing. - Will initiate PPI as noted below - Consider EGD if BS is abnormal or if PPI therapy does not resolve symptoms - Consider SLP referral if issues above are unremarkable  Gastroesophageal reflux symptoms without heartburn Symptoms of regurgitation and fullness without heartburn, present since stroke in May 2025. Symptoms improved with over-the-counter antacids but not completely resolved. Differential includes GERD without typical heartburn symptoms. She prefers to try medication before more invasive procedures. - Start Nexium 40 mg once daily, 30 minutes before breakfast or dinner, for two months to assess response to acid suppression therapy. - Consider EGD.  Colorectal cancer surveillance and Colon polyp surveillance Personal history of colonic polyps removed during colonoscopy in 2017.  Will get pathology to confirm if 10-year recall is needed or sooner. - Obtain pathology report from previous colonoscopy to confirm type of polyps and appropriate surveillance interval. - If polyps were precancerous, consider scheduling colonoscopy sooner than 2027.    Orders  Placed This Encounter  Procedures  DG ESOPHAGUS W SINGLE CM (SOL OR THIN BA)    New Prescriptions   No medications on file   Modified Medications   No medications on file    Planned Follow Up No follow-ups on file.   Total Time in Face-to-Face and in Coordination of Care for patient including independent/personal interpretation/review of prior testing, medical history, examination, medication adjustment, communicating results with the patient directly, and documentation within the EHR is 45 minutes.   Aloha Finner, MD [facility] Advanced Endoscopy Office # [phone]

## 2024-03-23 NOTE — Patient Instructions (Signed)
 You have been scheduled for a Barium Esophogram at [facility] (1st floor of the hospital) on 03/29/24 at 10:00 am . Please arrive 30 minutes prior to your appointment for registration. Make certain not to have anything to eat or drink 3 hours prior to your test. If you need to reschedule for any reason, please contact radiology at [phone] to do so. ____________________________________________________________ A barium swallow is an examination that concentrates on views of the esophagus. This tends to be a double contrast exam (barium and two liquids which, when combined, create a gas to distend the wall of the oesophagus) or single contrast (non-ionic iodine based). The study is usually tailored to your symptoms so a good history is essential. Attention is paid during the study to the form, structure and configuration of the esophagus, looking for functional disorders (such as aspiration, dysphagia, achalasia, motility and reflux) EXAMINATION You may be asked to change into a gown, depending on the type of swallow being performed. A radiologist and radiographer will perform the procedure. The radiologist will advise you of the type of contrast selected for your procedure and direct you during the exam. You will be asked to stand, sit or lie in several different positions and to hold a small amount of fluid in your mouth before being asked to swallow while the imaging is performed .In some instances you may be asked to swallow barium coated marshmallows to assess the motility of a solid food bolus. The exam can be recorded as a digital or video fluoroscopy procedure. POST PROCEDURE It will take 1-2 days for the barium to pass through your system. To facilitate this, it is important, unless otherwise directed, to increase your fluids for the next 24-48hrs and to resume your normal diet.  This test typically takes about 30 minutes to  perform. __________________________________________________________________ Due to recent changes in healthcare laws, you may see the results of your imaging and laboratory studies on MyChart before your provider has had a chance to review them.  We understand that in some cases there may be results that are confusing or concerning to you. Not all laboratory results come back in the same time frame and the provider may be waiting for multiple results in order to interpret others.  Please give us  48 hours in order for your provider to thoroughly review all the results before contacting the office for clarification of your results.   _______________________________________________________  If your blood pressure at your visit was 140/90 or greater, please contact your primary care physician to follow up on this.  _______________________________________________________  If you are age 43 or older, your body mass index should be between 23-30. Your Body mass index is 38.76 kg/m. If this is out of the aforementioned range listed, please consider follow up with your Primary Care Provider.  If you are age 67 or younger, your body mass index should be between 19-25. Your Body mass index is 38.76 kg/m. If this is out of the aformentioned range listed, please consider follow up with your Primary Care Provider.   ________________________________________________________  The [facility] providers would like to encourage you to use MYCHART to communicate with providers for non-urgent requests or questions.  Due to long hold times on the telephone, sending your provider a message by [facility] may be a faster and more efficient way to get a response.  Please allow 48 business hours for a response.  Please remember that this is for non-urgent requests.  _______________________________________________________  [facility] is  using a team-based approach to care.  Your team is made up of your doctor and  two to three APPS. Our APPS (Nurse Practitioners and Physician Assistants) work with your physician to ensure care continuity for you. They are fully qualified to address your health concerns and develop a treatment plan. They communicate directly with your gastroenterologist to care for you. Seeing the Advanced Practice Practitioners on your physician's team can help you by facilitating care more promptly, often allowing for earlier appointments, access to diagnostic testing, procedures, and other specialty referrals.   Thank you for choosing me and [facility].  Dr. Wilhel[place]ia

## 2024-03-24 ENCOUNTER — Other Ambulatory Visit (HOSPITAL_BASED_OUTPATIENT_CLINIC_OR_DEPARTMENT_OTHER): Payer: Self-pay

## 2024-03-29 ENCOUNTER — Other Ambulatory Visit: Payer: Self-pay | Admitting: Gastroenterology

## 2024-03-29 ENCOUNTER — Ambulatory Visit (HOSPITAL_COMMUNITY)
Admission: RE | Admit: 2024-03-29 | Discharge: 2024-03-29 | Disposition: A | Discharge status: Home / Self Care | Source: Ambulatory Visit | Attending: Gastroenterology | Admitting: Gastroenterology

## 2024-03-29 DIAGNOSIS — R630 Anorexia: Secondary | ICD-10-CM

## 2024-03-29 DIAGNOSIS — Z860100 Personal history of colon polyps, unspecified: Secondary | ICD-10-CM

## 2024-03-29 DIAGNOSIS — R1110 Vomiting, unspecified: Secondary | ICD-10-CM | POA: Diagnosis not present

## 2024-03-29 DIAGNOSIS — M2578 Osteophyte, vertebrae: Secondary | ICD-10-CM | POA: Diagnosis not present

## 2024-03-29 DIAGNOSIS — R09A2 Foreign body sensation, throat: Secondary | ICD-10-CM

## 2024-03-31 ENCOUNTER — Ambulatory Visit: Payer: Self-pay | Admitting: Gastroenterology

## 2024-04-23 ENCOUNTER — Emergency Department (HOSPITAL_BASED_OUTPATIENT_CLINIC_OR_DEPARTMENT_OTHER)
Admission: EM | Admit: 2024-04-23 | Discharge: 2024-04-23 | Disposition: A | Discharge status: Home / Self Care | Attending: Emergency Medicine | Admitting: Emergency Medicine

## 2024-04-23 ENCOUNTER — Encounter (HOSPITAL_BASED_OUTPATIENT_CLINIC_OR_DEPARTMENT_OTHER): Payer: Self-pay

## 2024-04-23 ENCOUNTER — Other Ambulatory Visit: Payer: Self-pay

## 2024-04-23 ENCOUNTER — Other Ambulatory Visit (HOSPITAL_BASED_OUTPATIENT_CLINIC_OR_DEPARTMENT_OTHER): Payer: Self-pay

## 2024-04-23 ENCOUNTER — Ambulatory Visit (INDEPENDENT_AMBULATORY_CARE_PROVIDER_SITE_OTHER): Admitting: Family Medicine

## 2024-04-23 ENCOUNTER — Encounter (HOSPITAL_BASED_OUTPATIENT_CLINIC_OR_DEPARTMENT_OTHER): Payer: Self-pay | Admitting: Family Medicine

## 2024-04-23 VITALS — BP 105/72 | HR 118 | Ht 66.0 in | Wt 240.1 lb

## 2024-04-23 DIAGNOSIS — Z7982 Long term (current) use of aspirin: Secondary | ICD-10-CM | POA: Insufficient documentation

## 2024-04-23 DIAGNOSIS — L509 Urticaria, unspecified: Secondary | ICD-10-CM | POA: Insufficient documentation

## 2024-04-23 DIAGNOSIS — R000 Tachycardia, unspecified: Secondary | ICD-10-CM | POA: Diagnosis not present

## 2024-04-23 DIAGNOSIS — R21 Rash and other nonspecific skin eruption: Secondary | ICD-10-CM | POA: Diagnosis present

## 2024-04-23 DIAGNOSIS — T7840XA Allergy, unspecified, initial encounter: Secondary | ICD-10-CM | POA: Diagnosis not present

## 2024-04-23 MED ORDER — CETIRIZINE HCL 10 MG PO TABS
10.0000 mg | ORAL_TABLET | Freq: Every day | ORAL | 0 refills | Status: DC
  Filled 2024-04-23: qty 10, 10d supply, fill #0

## 2024-04-23 MED ORDER — METHYLPREDNISOLONE SODIUM SUCC 125 MG IJ SOLR
125.0000 mg | Freq: Once | INTRAMUSCULAR | Status: AC
  Administered 2024-04-23: 125 mg via INTRAVENOUS
  Filled 2024-04-23: qty 2

## 2024-04-23 MED ORDER — FAMOTIDINE IN NACL 20-0.9 MG/50ML-% IV SOLN
20.0000 mg | Freq: Once | INTRAVENOUS | Status: AC
  Administered 2024-04-23: 20 mg via INTRAVENOUS
  Filled 2024-04-23: qty 50

## 2024-04-23 MED ORDER — PREDNISONE 20 MG PO TABS
40.0000 mg | ORAL_TABLET | Freq: Every day | ORAL | 0 refills | Status: DC
  Filled 2024-04-23: qty 10, 5d supply, fill #0

## 2024-04-23 MED ORDER — EPINEPHRINE 0.3 MG/0.3ML IJ SOAJ
0.3000 mg | INTRAMUSCULAR | 0 refills | Status: DC | PRN
  Filled 2024-04-23: qty 2, 30d supply, fill #0

## 2024-04-23 MED ORDER — LACTATED RINGERS IV SOLN: INTRAVENOUS | Status: DC

## 2024-04-23 MED ORDER — DIPHENHYDRAMINE HCL 50 MG/ML IJ SOLN
25.0000 mg | Freq: Once | INTRAMUSCULAR | Status: AC
  Administered 2024-04-23: 25 mg via INTRAVENOUS
  Filled 2024-04-23: qty 1

## 2024-04-23 NOTE — ED Triage Notes (Signed)
 Patient arrives from PCP. States she has body wide hives starting yesterday. She was encouraged to come here because she was tachycardic and had low BP. HR was 118 and BP was 105/72.  No known allergies.

## 2024-04-23 NOTE — Discharge Instructions (Addendum)
 You will take your next dose of prednisone  tomorrow because you have already had the steroid today.  You can take a dose of Zyrtec  today once you get home and then you can use Benadryl as needed every 6 hours.  If you start having worsening of your symptoms, any trouble breathing you would need to use the EpiPen.  If things are getting worse return to the emergency room.

## 2024-04-23 NOTE — Progress Notes (Signed)
    Procedures performed today:    None.  Independent interpretation of notes and tests performed by another provider:   None.  Brief History, Exam, Impression, and Recommendations:    BP 105/72   Pulse (!) 118   Ht 5' 6 (1.676 m)   Wt 240 lb 1.6 oz (108.9 kg)   SpO2 98%   BMI 38.75 kg/m   Discussed the use of AI scribe software for clinical note transcription with the patient, who gave verbal consent to proceed.  History of Present Illness Dawn Carlson is a 73 year old female who presents with a widespread rash and associated symptoms.  The rash began yesterday and has since spread to her entire body, including her face, eyes, and scalp. It is described as widespread and affecting all areas of her body.  In addition to the rash, she experiences joint soreness, severe fatigue, and hoarseness affecting her speech. She notes a significant lack of energy, stating, 'I can't walk from here to the door without stopping.'  She denies abdominal pain.  She mentions a recent change in the brand of aspirin  she took, which is the only new medication she recalls taking. She has been on Nexium, a blood pressure pill, and a cholesterol pill without previous issues.  She attempted to manage her symptoms with Benadryl and a cream from [facility], but these treatments have not provided relief.  On exam, patient does have lower blood pressure compared to her baseline and certainly elevated heart rate.  She does sound slightly more hoarse on exam than her usual voice compared to prior visits.  On auscultation, tachycardia is noted, however no obvious murmur appreciated.  Lungs are clear to auscultation bilaterally.  She does have diffuse urticaria over entire body, has lesions present over face, around eyes.  Along extremities, urticaria are occurring in patches with confluence of individual lesions.  She does have moderate erythema through bilateral upper extremities.  Allergic reaction,  initial encounter Assessment & Plan: Acute systemic allergic reaction with rash and systemic symptoms Acute widespread rash with systemic symptoms and vital signs indicating possible more severe reaction. Discussed concerns and recommend evaluation in ED - patient in agreement, we will transport her downstairs now. - Referred to emergency department for evaluation and management.   Urticaria  Return if symptoms worsen or fail to improve.   ___________________________________________ Dawn Raiche de [place], MD, ABFM, [facility]

## 2024-04-23 NOTE — Patient Instructions (Signed)
  Medication Instructions:  Your physician recommends that you continue on your current medications as directed. Please refer to the Current Medication list given to you today. --If you need a refill on any your medications before your next appointment, please call your pharmacy first. If no refills are authorized on file call the office.-- Lab Work: Your physician has recommended that you have lab work today: none If you have labs (blood work) drawn today and your tests are completely normal, you will receive your results via MyChart message OR a phone call from our staff.  Please ensure you check your voicemail in the event that you authorized detailed messages to be left on a delegated number. If you have any lab test that is abnormal or we need to change your treatment, we will call you to review the results.  Referrals/Procedures/Imaging: none   Follow-Up: Your next appointment:   Your physician recommends that you schedule a follow-up appointment in: As needed with Dr. de Cuba  You will receive a text message or e-mail with a link to a survey about your care and experience with us  today! We would greatly appreciate your feedback!   Thanks for letting us  be apart of your health journey!!  Primary Care and Sports Medicine   Dr. Quintin sheerer Cuba   We encourage you to activate your patient portal called MyChart.  Sign up information is provided on this After Visit Summary.  MyChart is used to connect with patients for Virtual Visits (Telemedicine).  Patients are able to view lab/test results, encounter notes, upcoming appointments, etc.  Non-urgent messages can be sent to your provider as well. To learn more about what you can do with MyChart, please visit --  [email or web address].

## 2024-04-23 NOTE — Assessment & Plan Note (Signed)
 Acute systemic allergic reaction with rash and systemic symptoms Acute widespread rash with systemic symptoms and vital signs indicating possible more severe reaction. Discussed concerns and recommend evaluation in ED - patient in agreement, we will transport her downstairs now. - Referred to emergency department for evaluation and management.

## 2024-04-23 NOTE — ED Provider Notes (Signed)
 [place] EMERGENCY DEPARTMENT AT [facility] Provider Note   CSN: [id number] Arrival date & time: 04/23/24  9062     Patient presents with: Urticaria, Tachycardia, and Hypotension   Dawn Carlson is a 73 y.o. female.   Patient is a 73 year old female with a history of prior stroke and thyroid  disease who is presenting today with diffuse hives and itching from her doctor's office.  She reports the symptoms started yesterday and have only progressed.  She reports the hives affect all of her body but not her mouth.  She does not have any shortness of breath but has had some nausea.  She states the only thing that has been different is she ran out of her prescription 81 mg aspirin  and went and got an over-the-counter 1 and had taken maybe 3 doses before this occurred.  She denies any new foods or other medications or vaccines.  She reports having hives once before but it was very localized and not all over the body.  The history is provided by the patient.  Urticaria       Prior to Admission medications   Medication Sig Start Date End Date Taking? Authorizing Provider  cetirizine  (ZYRTEC  ALLERGY) 10 MG tablet Take 1 tablet (10 mg total) by mouth daily. 04/23/24  Yes Tressa Maldonado, Benton, MD  EPINEPHrine 0.3 mg/0.3 mL IJ SOAJ injection Inject 0.3 mg into the muscle as needed for anaphylaxis. 04/23/24  Yes Leshonda Galambos, Benton, MD  predniSONE  (DELTASONE ) 20 MG tablet Take 2 tablets (40 mg total) by mouth daily. 04/23/24  Yes Devian Bartolomei, Benton, MD  amLODipine  (NORVASC ) 2.5 MG tablet Take 1 tablet (2.5 mg total) by mouth daily. 03/17/24   de Cuba, Raymond J, MD  aspirin  81 MG chewable tablet Chew 1 tablet (81 mg total) by mouth daily. 11/12/23   Gonfa, Taye T, MD  esomeprazole (NEXIUM) 40 MG capsule Take 1 capsule (40 mg total) by mouth in the morning and at bedtime. 03/23/24   Mansouraty, Aloha Raddle., MD  latanoprost  (XALATAN ) 0.005 % ophthalmic solution Place 1 drop into affected eye(s)  every evening. 09/24/23     rosuvastatin  (CRESTOR ) 40 MG tablet Take 1 tablet (40 mg total) by mouth at bedtime. 02/18/24 05/19/24  de [place], Raymond J, MD  sodium chloride  (MURO 128) 5 % ophthalmic solution Place 1 drop into both eyes 2 (two) times daily. 04/23/23       Allergies: Patient has no known allergies.    Review of Systems  Updated Vital Signs BP 109/69   Pulse 99   Temp 97.6 F (36.4 C) (Oral)   Resp 16   SpO2 96%   Physical Exam Vitals and nursing note reviewed.  Constitutional:      General: She is not in acute distress.    Appearance: She is well-developed.  HENT:     Head: Normocephalic and atraumatic.     Mouth/Throat:     Comments: Mouth is clear without any mucosal involvement Eyes:     Pupils: Pupils are equal, round, and reactive to light.  Neck:     Comments: No stridor Cardiovascular:     Rate and Rhythm: Normal rate and regular rhythm.     Heart sounds: Normal heart sounds. No murmur heard.    No friction rub.  Pulmonary:     Effort: Pulmonary effort is normal.     Breath sounds: Normal breath sounds. No wheezing or rales.  Abdominal:     General: Bowel sounds are normal. There is no  distension.     Palpations: Abdomen is soft.     Tenderness: There is no abdominal tenderness. There is no guarding or rebound.  Musculoskeletal:        General: No tenderness. Normal range of motion.     Comments: No edema  Skin:    General: Skin is warm and dry.     Findings: Rash present.     Comments: Diffuse urticaria present all over the body including face, arms, chest and legs.  Neurological:     Mental Status: She is alert and oriented to person, place, and time.     Cranial Nerves: No cranial nerve deficit.  Psychiatric:        Behavior: Behavior normal.     (all labs ordered are listed, but only abnormal results are displayed) Labs Reviewed - No data to display  EKG: EKG Interpretation Date/Time:  Friday April 23 2024 09:51:10 EST Ventricular  Rate:  105 PR Interval:  176 QRS Duration:  82 QT Interval:  320 QTC Calculation: 423 R Axis:   46  Text Interpretation: Sinus tachycardia Nonspecific T abnormalities, lateral leads No significant change since last tracing Confirmed by Doretha Folks ([id number]) on 04/23/2024 10:10:59 AM  Radiology: No results found.   Procedures   Medications Ordered in the ED  lactated ringers infusion ( Intravenous New Bag/Given 04/23/24 1139)  diphenhydrAMINE (BENADRYL) injection 25 mg (25 mg Intravenous Given 04/23/24 1137)  famotidine (PEPCID) IVPB 20 mg premix (20 mg Intravenous New Bag/Given 04/23/24 1220)  methylPREDNISolone sodium succinate (SOLU-MEDROL) 125 mg/2 mL injection 125 mg (125 mg Intravenous Given 04/23/24 1138)                                    Medical Decision Making Risk Prescription drug management.   Patient presenting today with diffuse urticaria.  She is otherwise stable on exam.  No airway involvement.  Initially had lower blood pressure at PCP office but here last blood pressure was 120/64.  Potentially allergic to an over-the-counter aspirin  she started taking.  She was given Benadryl, Solu-Medrol and Pepcid.  Will continue to monitor.  No evidence of anaphylaxis at this time that requires an EpiPen.  I independently interpreted patient's EKG which shows a sinus tachycardia but no other acute findings.  1:51 PM Hives are less pronounced on repeat evaluation the patient's blood pressure has been stable.  At this time patient appears stable for discharge.  She was given instructions on how to use an EpiPen if things were to worsen, avoid the new medication and following up with allergy.  She was also given a short course of steroids and then also to continue Zyrtec .     Final diagnoses:  Urticaria    ED Discharge Orders          Ordered    EPINEPHrine 0.3 mg/0.3 mL IJ SOAJ injection  As needed        04/23/24 1350    predniSONE  (DELTASONE ) 20 MG tablet  Daily         04/23/24 1350    cetirizine  (ZYRTEC  ALLERGY) 10 MG tablet  Daily        04/23/24 1350               Doretha Folks, MD 04/23/24 1351

## 2024-04-26 ENCOUNTER — Other Ambulatory Visit (HOSPITAL_BASED_OUTPATIENT_CLINIC_OR_DEPARTMENT_OTHER): Payer: Self-pay

## 2024-04-27 ENCOUNTER — Other Ambulatory Visit (HOSPITAL_BASED_OUTPATIENT_CLINIC_OR_DEPARTMENT_OTHER): Payer: Self-pay

## 2024-05-03 NOTE — Progress Notes (Unsigned)
 Chief Complaint: Follow-up globus sensation  HPI:    Dawn Carlson is a 73 year old female with a past medical history as listed below including stroke on aspirin , hypertension, glaucoma and colon polyps, known to Dr. Wilhelmenia, who presents to clinic today for follow-up of globus sensation.    03/23/2024 patient seen in clinic by Dr. Wilhelmenia for globus sensation, anorexia and history of colon polyps.  At that time discussed first diagnosed stroke in May 2025.  Noted globus every day.  No weight loss.  At that visit recommended barium swallow possibly followed by MBS with SLP.  Also started on a PPI.  Recommended considering a EGD if BS was abnormal or PPI did not resolve symptoms.  Consider SLP referral if all other studies unremarkable.  Started on Nexium 40 mg daily.  Tried to obtain a pathology report.    03/29/2024 esophagram showed prominent anterior vertebral osteophyte at C5-6 indents the posterior wall of the cervical esophagus but did not impede passage of barium or barium tablet.    03/31/2024 patient called to follow-up after esophagram which showed an osteophyte in C5/C6 which partially indented the esophagus.  The barium tablet passed though.  GI history: GI Procedures and Studies  2017 colonoscopy 4 sessile polyps ranging in size from 3 to 5 mm were found in the sigmoid colon.  Polypectomy performed. A single sessile 3 mm polyp found in the transverse colon.  Cold forceps resection. Follow-up to be dictated based on results   Laboratory Studies  Reviewed those in [facility]   Imaging Studies  No relevant studies to review  Past Medical History:  Diagnosis Date   Glaucoma 2024   Eye Right   Stroke [facility])    Thyroid  disease     Past Surgical History:  Procedure Laterality Date   ABDOMINAL HYSTERECTOMY  approximately 35 years ago   CARPAL TUNNEL RELEASE      Current Outpatient Medications  Medication Sig Dispense Refill   amLODipine  (NORVASC ) 2.5 MG tablet Take 1 tablet  (2.5 mg total) by mouth daily. 90 tablet 1   aspirin  81 MG chewable tablet Chew 1 tablet (81 mg total) by mouth daily.     cetirizine  (ZYRTEC  ALLERGY) 10 MG tablet Take 1 tablet (10 mg total) by mouth daily. 10 tablet 0   EPINEPHrine 0.3 mg/0.3 mL IJ SOAJ injection Inject 0.3 mg into the muscle as needed for anaphylaxis. 2 each 0   esomeprazole (NEXIUM) 40 MG capsule Take 1 capsule (40 mg total) by mouth in the morning and at bedtime. 30 capsule 2   latanoprost  (XALATAN ) 0.005 % ophthalmic solution Place 1 drop into affected eye(s) every evening. 7.5 mL 11   predniSONE  (DELTASONE ) 20 MG tablet Take 2 tablets (40 mg total) by mouth daily. 10 tablet 0   rosuvastatin  (CRESTOR ) 40 MG tablet Take 1 tablet (40 mg total) by mouth at bedtime. 90 tablet 3   sodium chloride  (MURO 128) 5 % ophthalmic solution Place 1 drop into both eyes 2 (two) times daily. 15 mL 5   No current facility-administered medications for this visit.    Allergies as of 05/04/2024   (No Known Allergies)    Family History  Problem Relation Age of Onset   Hypertension Mother    Heart disease Mother    Diabetes Mother    ADD / ADHD Mother    Arthritis Mother    Vision loss Mother    ADD / ADHD Father    Hearing loss Father  Diabetes Sister    ADD / ADHD Sister    Diabetes Sister    ADD / ADHD Sister    Arthritis Sister    COPD Sister    Diabetes Sister    Heart disease Sister    Kidney disease Sister    Diabetes Sister    Diabetes Brother    ADD / ADHD Brother    COPD Brother    ADD / ADHD Brother    COPD Brother    Diabetes Brother    Hearing loss Brother    Vision loss Brother    ADD / ADHD Brother    Diabetes Brother    Hearing loss Brother    Vision loss Brother    Schizophrenia Grandson    Diabetes Other    Breast cancer Neg Hx    Colon cancer Neg Hx    Esophageal cancer Neg Hx    Inflammatory bowel disease Neg Hx    Liver disease Neg Hx    Pancreatic cancer Neg Hx    Rectal cancer Neg Hx     Stomach cancer Neg Hx     Social History   Socioeconomic History   Marital status: Divorced    Spouse name: Not on file   Number of children: Not on file   Years of education: Not on file   Highest education level: Not on file  Occupational History   Occupation: retired    Comment: Forensic Scientist  Tobacco Use   Smoking status: Never    Passive exposure: Never   Smokeless tobacco: Never  Vaping Use   Vaping status: Never Used  Substance and Sexual Activity   Alcohol use: No   Drug use: No   Sexual activity: Not Currently    Birth control/protection: Condom  Other Topics Concern   Not on file  Social History Narrative   Lives in her own home with adult grandson.    Divorced for over 30 years   Jehovah's Witness.    Social Drivers of Corporate Investment Banker Strain: Low Risk  (01/06/2024)   Overall Financial Resource Strain (CARDIA)    Difficulty of Paying Living Expenses: Not hard at all  Food Insecurity: No Food Insecurity (01/06/2024)   Hunger Vital Sign    Worried About Running Out of Food in the Last Year: Never true    Ran Out of Food in the Last Year: Never true  Transportation Needs: No Transportation Needs (01/06/2024)   PRAPARE - Administrator, Civil Service (Medical): No    Lack of Transportation (Non-Medical): No  Physical Activity: Sufficiently Active (01/06/2024)   Exercise Vital Sign    Days of Exercise per Week: 7 days    Minutes of Exercise per Session: 30 min  Stress: No Stress Concern Present (01/06/2024)   [employer] of Occupational Health - Occupational Stress Questionnaire    Feeling of Stress: Not at all  Social Connections: Socially Integrated (01/06/2024)   Social Connection and Isolation Panel    Frequency of Communication with Friends and Family: More than three times a week    Frequency of Social Gatherings with Friends and Family: More than three times a week    Attends Religious Services: More than 4 times per year     Active Member of [employer] or Organizations: Yes    Attends Banker Meetings: More than 4 times per year    Marital Status: Married  Catering Manager Violence: Not At Risk (01/06/2024)  Humiliation, Afraid, Rape, and Kick questionnaire    Fear of Current or Ex-Partner: No    Emotionally Abused: No    Physically Abused: No    Sexually Abused: No    Review of Systems:    Constitutional: No weight loss, fever, chills, weakness or fatigue HEENT: Eyes: No change in vision               Ears, Nose, Throat:  No change in hearing or congestion Skin: No rash or itching Cardiovascular: No chest pain, chest pressure or palpitations   Respiratory: No SOB or cough Gastrointestinal: See HPI and otherwise negative Genitourinary: No dysuria or change in urinary frequency Neurological: No headache, dizziness or syncope Musculoskeletal: No new muscle or joint pain Hematologic: No bleeding or bruising Psychiatric: No history of depression or anxiety    Physical Exam:  Vital signs: There were no vitals taken for this visit.  Constitutional:   Pleasant Caucasian female appears to be in NAD, Well developed, Well nourished, alert and cooperative Head:  Normocephalic and atraumatic. Eyes:   PEERL, EOMI. No icterus. Conjunctiva pink. Ears:  Normal auditory acuity. Neck:  Supple Throat: Oral cavity and pharynx without inflammation, swelling or lesion.  Respiratory: Respirations even and unlabored. Lungs clear to auscultation bilaterally.   No wheezes, crackles, or rhonchi.  Cardiovascular: Normal S1, S2. No MRG. Regular rate and rhythm. No peripheral edema, cyanosis or pallor.  Gastrointestinal:  Soft, nondistended, nontender. No rebound or guarding. Normal bowel sounds. No appreciable masses or hepatomegaly. Rectal:  Not performed.  Msk:  Symmetrical without gross deformities. Without edema, no deformity or joint abnormality.  Neurologic:  Alert and  oriented x4;  grossly normal  neurologically.  Skin:   Dry and intact without significant lesions or rashes. Psychiatric: Oriented to person, place and time. Demonstrates good judgement and reason without abnormal affect or behaviors.  RELEVANT LABS AND IMAGING: CBC    Component Value Date/Time   WBC 8.6 11/19/2023 1153   WBC 11.4 (H) 11/10/2023 1447   RBC 5.15 11/19/2023 1153   RBC 5.15 (H) 11/10/2023 1447   HGB 14.6 11/19/2023 1153   HCT 44.6 11/19/2023 1153   PLT 264 11/19/2023 1153   MCV 87 11/19/2023 1153   MCH 28.3 11/19/2023 1153   MCH 28.2 11/10/2023 1447   MCHC 32.7 11/19/2023 1153   MCHC 35.4 11/10/2023 1447   RDW 15.1 11/19/2023 1153   LYMPHSABS 2.7 11/19/2023 1153   EOSABS 0.2 11/19/2023 1153   BASOSABS 0.1 11/19/2023 1153    CMP     Component Value Date/Time   NA 140 11/26/2023 0915   K 4.2 11/26/2023 0915   CL 106 11/26/2023 0915   CO2 19 (L) 11/26/2023 0915   GLUCOSE 102 (H) 11/26/2023 0915   GLUCOSE 177 (H) 11/10/2023 1447   BUN 8 11/26/2023 0915   CREATININE 1.02 (H) 11/26/2023 0915   CALCIUM  9.1 11/26/2023 0915   PROT 7.2 11/26/2023 0915   ALBUMIN 4.2 11/26/2023 0915   AST 20 11/26/2023 0915   ALT 15 11/26/2023 0915   ALKPHOS 106 11/26/2023 0915   BILITOT 0.4 11/26/2023 0915   GFRNONAA >60 11/10/2023 1447    Assessment: 1.  Globus sensation: 2.  History of colon polyps:  Plan: 1.  Follow-up on pathology from last colonoscopy in 2017     Delon Failing, [place] [facility] 05/03/2024, 1:26 PM  Cc: de Cuba, Quintin PARAS, MD

## 2024-05-04 ENCOUNTER — Encounter: Payer: Self-pay | Admitting: Physician Assistant

## 2024-05-04 ENCOUNTER — Ambulatory Visit (INDEPENDENT_AMBULATORY_CARE_PROVIDER_SITE_OTHER): Admitting: Physician Assistant

## 2024-05-04 ENCOUNTER — Other Ambulatory Visit (HOSPITAL_BASED_OUTPATIENT_CLINIC_OR_DEPARTMENT_OTHER): Payer: Self-pay

## 2024-05-04 VITALS — BP 104/70 | HR 87 | Ht 66.0 in | Wt 242.6 lb

## 2024-05-04 DIAGNOSIS — H52223 Regular astigmatism, bilateral: Secondary | ICD-10-CM | POA: Diagnosis not present

## 2024-05-04 DIAGNOSIS — H401131 Primary open-angle glaucoma, bilateral, mild stage: Secondary | ICD-10-CM | POA: Diagnosis not present

## 2024-05-04 DIAGNOSIS — H524 Presbyopia: Secondary | ICD-10-CM | POA: Diagnosis not present

## 2024-05-04 DIAGNOSIS — Z860100 Personal history of colon polyps, unspecified: Secondary | ICD-10-CM | POA: Diagnosis not present

## 2024-05-04 DIAGNOSIS — H18513 Endothelial corneal dystrophy, bilateral: Secondary | ICD-10-CM | POA: Diagnosis not present

## 2024-05-04 DIAGNOSIS — H5203 Hypermetropia, bilateral: Secondary | ICD-10-CM | POA: Diagnosis not present

## 2024-05-04 DIAGNOSIS — H35033 Hypertensive retinopathy, bilateral: Secondary | ICD-10-CM | POA: Diagnosis not present

## 2024-05-04 DIAGNOSIS — R09A2 Foreign body sensation, throat: Secondary | ICD-10-CM | POA: Diagnosis not present

## 2024-05-04 DIAGNOSIS — H2513 Age-related nuclear cataract, bilateral: Secondary | ICD-10-CM | POA: Diagnosis not present

## 2024-05-04 MED ORDER — SODIUM CHLORIDE (HYPERTONIC) 5 % OP SOLN
OPHTHALMIC | 11 refills | Status: AC
  Filled 2024-05-04: qty 10, 45d supply, fill #0

## 2024-05-04 MED ORDER — ESOMEPRAZOLE MAGNESIUM 40 MG PO CPDR
40.0000 mg | DELAYED_RELEASE_CAPSULE | Freq: Every day | ORAL | 5 refills | Status: AC
  Filled 2024-05-04 – 2024-05-11 (×2): qty 30, 30d supply, fill #0
  Filled 2024-06-08: qty 30, 30d supply, fill #1
  Filled 2024-07-15: qty 30, 30d supply, fill #2

## 2024-05-04 MED ORDER — LATANOPROST 0.005 % OP SOLN
1.0000 [drp] | Freq: Every evening | OPHTHALMIC | 5 refills | Status: AC
  Filled 2024-05-04: qty 5, 50d supply, fill #0

## 2024-05-04 NOTE — Patient Instructions (Signed)
 Taper down to once daily on your Nexium over the next week.   We have sent the following medications to your pharmacy for you to pick up at your convenience: Nexium 40 mg once daily.   _______________________________________________________  If your blood pressure at your visit was 140/90 or greater, please contact your primary care physician to follow up on this.  _______________________________________________________  If you are age 71 or older, your body mass index should be between 23-30. Your Body mass index is 39.16 kg/m. If this is out of the aforementioned range listed, please consider follow up with your Primary Care Provider.  If you are age 70 or younger, your body mass index should be between 19-25. Your Body mass index is 39.16 kg/m. If this is out of the aformentioned range listed, please consider follow up with your Primary Care Provider.   ________________________________________________________  The [facility] providers would like to encourage you to use MYCHART to communicate with providers for non-urgent requests or questions.  Due to long hold times on the telephone, sending your provider a message by [facility] may be a faster and more efficient way to get a response.  Please allow 48 business hours for a response.  Please remember that this is for non-urgent requests.  _______________________________________________________  [facility] is using a team-based approach to care.  Your team is made up of your doctor and two to three APPS. Our APPS (Nurse Practitioners and Physician Assistants) work with your physician to ensure care continuity for you. They are fully qualified to address your health concerns and develop a treatment plan. They communicate directly with your gastroenterologist to care for you. Seeing the Advanced Practice Practitioners on your physician's team can help you by facilitating care more promptly, often allowing for earlier appointments,  access to diagnostic testing, procedures, and other specialty referrals.   Thank you for choosing me and [facility].  Delon Failing, PA-C

## 2024-05-05 ENCOUNTER — Encounter: Admitting: Neurology

## 2024-05-05 NOTE — Progress Notes (Signed)
 Attending Physician's Attestation   I have reviewed the chart.   I agree with the Advanced Practitioner's note, impression, and recommendations with any updates as below.    Corliss Parish, MD [facility] Advanced Endoscopy Office # [phone]

## 2024-05-11 ENCOUNTER — Other Ambulatory Visit (HOSPITAL_BASED_OUTPATIENT_CLINIC_OR_DEPARTMENT_OTHER): Payer: Self-pay

## 2024-06-07 ENCOUNTER — Encounter: Payer: Self-pay | Admitting: Adult Health

## 2024-06-07 ENCOUNTER — Ambulatory Visit: Admitting: Adult Health

## 2024-06-07 VITALS — BP 142/79 | HR 76 | Ht 66.0 in | Wt 244.4 lb

## 2024-06-07 DIAGNOSIS — G4733 Obstructive sleep apnea (adult) (pediatric): Secondary | ICD-10-CM

## 2024-06-07 DIAGNOSIS — I639 Cerebral infarction, unspecified: Secondary | ICD-10-CM

## 2024-06-07 NOTE — Patient Instructions (Signed)
 Your Plan:  Continue nightly use of CPAP for adequate sleep apnea management   Continue to follow with your DME [employer] [facility] for any needed supplies or CPAP related concerns   Continue close follow up with your PCP for stroke risk factor management       Follow up in 1 year or caller if needed      Thank you for coming to see us  at [facility]. I hope we have been able to provide you high quality care today.  You may receive a patient satisfaction survey over the next few weeks. We would appreciate your feedback and comments so that we may continue to improve ourselves and the health of our patients.

## 2024-06-07 NOTE — Progress Notes (Signed)
 " [facility] [address] [place]. [place] [address] [phone]       OFFICE FOLLOW UP NOTE  [name] Date of Birth:  11/17/1950 Medical Record Number:  [id number]     Reason for visit: Initial CPAP follow-up    SUBJECTIVE:   CHIEF COMPLAINT:  Chief Complaint  Patient presents with   Follow-up    Patient in room 3 alone.  Patient is here for cpap follow up, patient has no new issues or concerns at this moment.      Follow-up visit:  Prior visit: 12/25/2023 with Dr. Buck  Brief HPI:   Dawn Carlson is a 73 y.o. female with hx of stroke in 10/2023 who was evaluated in 12/2023 for concern of underlying sleep apnea with complaints of snoring, excessive daytime somnolence and witnessed apneas.  ESS 2/24.  In-lab sleep study 01/2024 showed moderate OSA and recommended initiation of CPAP.  AutoPap initiated 02/2024.     Interval history:  Patient returns for initial CPAP follow up visit.  Reports gradually improving tolerance to CPAP therapy.  She has been struggling more over the past couple weeks due to caring for her brother.  Initially using F30i mask but difficulty tolerating, now using nasal pillow which she feels she is doing better with. Has not noticed much different with CPAP use as far as sleep quality and daytime energy levels. ESS 3/24. She is amenable to continue with treatment and hopeful continued improvement of tolerance. No new stroke/TIA symptoms. Continues on aspirin  and statin, no side effects. Continues routine f/u with PCP         ROS:   14 system review of systems performed and negative with exception of those listed in HPI  PMH:  Past Medical History:  Diagnosis Date   Glaucoma 2024   Eye Right   Stroke [facility])    Thyroid  disease     PSH:  Past Surgical History:  Procedure Laterality Date   ABDOMINAL HYSTERECTOMY  approximately 35 years ago   CARPAL TUNNEL RELEASE      Social History:  Social History    Socioeconomic History   Marital status: Divorced    Spouse name: Not on file   Number of children: Not on file   Years of education: Not on file   Highest education level: Not on file  Occupational History   Occupation: retired    Comment: Forensic Scientist  Tobacco Use   Smoking status: Never    Passive exposure: Never   Smokeless tobacco: Never  Vaping Use   Vaping status: Never Used  Substance and Sexual Activity   Alcohol use: No   Drug use: No   Sexual activity: Not Currently    Birth control/protection: Condom  Other Topics Concern   Not on file  Social History Narrative   Lives in her own home with adult grandson.    Divorced for over 30 years   Jehovah's Witness.    Patient is retired.    Social Drivers of Health   Tobacco Use: Low Risk (06/07/2024)   Patient History    Smoking Tobacco Use: Never    Smokeless Tobacco Use: Never    Passive Exposure: Never  Financial Resource Strain: Low Risk (01/06/2024)   Overall Financial Resource Strain (CARDIA)    Difficulty of Paying Living Expenses: Not hard at all  Food Insecurity: No Food Insecurity (01/06/2024)   Epic    Worried About Running Out of Food in the Last Year: Never  true    Ran Out of Food in the Last Year: Never true  Transportation Needs: No Transportation Needs (01/06/2024)   Epic    Lack of Transportation (Medical): No    Lack of Transportation (Non-Medical): No  Physical Activity: Sufficiently Active (01/06/2024)   Exercise Vital Sign    Days of Exercise per Week: 7 days    Minutes of Exercise per Session: 30 min  Stress: No Stress Concern Present (01/06/2024)   [employer] of Occupational Health - Occupational Stress Questionnaire    Feeling of Stress: Not at all  Social Connections: Socially Integrated (01/06/2024)   Social Connection and Isolation Panel    Frequency of Communication with Friends and Family: More than three times a week    Frequency of Social Gatherings with Friends and Family:  More than three times a week    Attends Religious Services: More than 4 times per year    Active Member of Clubs or Organizations: Yes    Attends Banker Meetings: More than 4 times per year    Marital Status: Married  Catering Manager Violence: Not At Risk (01/06/2024)   Epic    Fear of Current or Ex-Partner: No    Emotionally Abused: No    Physically Abused: No    Sexually Abused: No  Depression (PHQ2-9): Low Risk (03/17/2024)   Depression (PHQ2-9)    PHQ-2 Score: 0  Alcohol Screen: Low Risk (01/06/2024)   Alcohol Screen    Last Alcohol Screening Score (AUDIT): 0  Housing: Low Risk (01/06/2024)   Epic    Unable to Pay for Housing in the Last Year: No    Number of Times Moved in the Last Year: 0    Homeless in the Last Year: No  Utilities: Not At Risk (01/06/2024)   Epic    Threatened with loss of utilities: No  Health Literacy: Adequate Health Literacy (01/06/2024)   B1300 Health Literacy    Frequency of need for help with medical instructions: Never    Family History:  Family History  Problem Relation Age of Onset   Hypertension Mother    Heart disease Mother    Diabetes Mother    ADD / ADHD Mother    Arthritis Mother    Vision loss Mother    ADD / ADHD Father    Hearing loss Father    Diabetes Sister    ADD / ADHD Sister    Diabetes Sister    ADD / ADHD Sister    Arthritis Sister    COPD Sister    Diabetes Sister    Heart disease Sister    Kidney disease Sister    Diabetes Sister    Diabetes Brother    ADD / ADHD Brother    COPD Brother    ADD / ADHD Brother    COPD Brother    Diabetes Brother    Hearing loss Brother    Vision loss Brother    ADD / ADHD Brother    Diabetes Brother    Hearing loss Brother    Vision loss Brother    Schizophrenia Grandson    Diabetes Other    Breast cancer Neg Hx    Colon cancer Neg Hx    Esophageal cancer Neg Hx    Inflammatory bowel disease Neg Hx    Liver disease Neg Hx    Pancreatic cancer Neg Hx     Rectal cancer Neg Hx    Stomach cancer Neg Hx  Medications:  Medications Ordered Prior to Encounter[1]  Allergies:  Allergies[2]    OBJECTIVE:  Physical Exam  Vitals:   06/07/24 1242  BP: (!) 142/79  Pulse: 76  Weight: 244 lb 6.4 oz (110.9 kg)  Height: 5' 6 (1.676 m)   Body mass index is 39.45 kg/m. No results found.   General: well developed, well nourished, very pleasant elderly female, seated, in no evident distress Head: head normocephalic and atraumatic.   Neck: supple with no carotid or supraclavicular bruits Cardiovascular: regular rate and rhythm, no murmurs  Neurologic Exam Mental Status: Awake and fully alert. Oriented to place and time. Recent and remote memory intact. Attention span, concentration and fund of knowledge appropriate. Mood and affect appropriate.  Cranial Nerves: Pupils equal, briskly reactive to light. Extraocular movements full without nystagmus. Visual fields full to confrontation. Hearing intact. Facial sensation intact. Face, tongue, palate moves normally and symmetrically.  Motor: Normal bulk and tone. Normal strength in all tested extremity muscles Gait and Station: Arises from chair without difficulty. Stance is normal. Gait demonstrates slow cautious steps with mild favoring of RLE d/t knee pain        ASSESSMENT/PLAN: Dawn Carlson is a 73 y.o. year old female    OSA on CPAP :  Compliance report shows satisfactory usage with optimal residual AHI.   Discussed ways to further help improve tolerance Continue current pressure settings 6-12 with EPR 2 Discussed continued nightly usage with ensuring greater than 4 hours nightly for optimal benefit and per insurance purposes.   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   Harlene Bogaert, AGNP-BC  [facility] [address] [place], [place] [address]  Phone [phone] Fax [phone] Note: This document was prepared with digital dictation and possible smart phrase technology. Any transcriptional errors that result from this process are unintentional.         [1]  Current Outpatient Medications on File Prior to Visit  Medication Sig Dispense Refill   amLODipine  (NORVASC ) 2.5 MG tablet Take 1 tablet (2.5 mg total) by mouth daily. 90 tablet 1   aspirin  81 MG chewable tablet Chew 1 tablet (81 mg total) by mouth daily.     esomeprazole  (NEXIUM ) 40 MG capsule Take 1 capsule (40 mg total) by mouth daily. 30 capsule 5   latanoprost  (XALATAN ) 0.005 % ophthalmic solution Place 1 drop into affected eye(s) every evening. 7.5 mL 11   latanoprost  (XALATAN ) 0.005 % ophthalmic solution Place 1 drop into both eyes every evening. 7.5 mL 5   rosuvastatin  (CRESTOR ) 40 MG tablet Take 1 tablet (40 mg total) by mouth at bedtime. 90 tablet 3   sodium chloride  (MURO 128) 5 % ophthalmic solution Instill 1 drop in each eye twice daily 10 mL 11   No current facility-administered medications on file prior to visit.  [2]  No Known Allergies  "

## 2024-06-12 ENCOUNTER — Other Ambulatory Visit (HOSPITAL_BASED_OUTPATIENT_CLINIC_OR_DEPARTMENT_OTHER): Payer: Self-pay

## 2024-06-18 ENCOUNTER — Ambulatory Visit (HOSPITAL_BASED_OUTPATIENT_CLINIC_OR_DEPARTMENT_OTHER): Admitting: Family Medicine

## 2024-07-14 ENCOUNTER — Ambulatory Visit: Admitting: Family Medicine

## 2024-07-14 ENCOUNTER — Other Ambulatory Visit (HOSPITAL_BASED_OUTPATIENT_CLINIC_OR_DEPARTMENT_OTHER): Payer: Self-pay

## 2024-07-14 ENCOUNTER — Encounter: Payer: Self-pay | Admitting: Family Medicine

## 2024-07-14 VITALS — BP 152/94 | HR 75 | Temp 98.5°F | Ht 66.0 in | Wt 241.6 lb

## 2024-07-14 DIAGNOSIS — I10 Essential (primary) hypertension: Secondary | ICD-10-CM

## 2024-07-14 DIAGNOSIS — Z7689 Persons encountering health services in other specified circumstances: Secondary | ICD-10-CM

## 2024-07-14 DIAGNOSIS — E782 Mixed hyperlipidemia: Secondary | ICD-10-CM | POA: Diagnosis not present

## 2024-07-14 DIAGNOSIS — R09A2 Foreign body sensation, throat: Secondary | ICD-10-CM | POA: Diagnosis not present

## 2024-07-14 DIAGNOSIS — Z8673 Personal history of transient ischemic attack (TIA), and cerebral infarction without residual deficits: Secondary | ICD-10-CM | POA: Diagnosis not present

## 2024-07-14 MED ORDER — AMLODIPINE BESYLATE 5 MG PO TABS
5.0000 mg | ORAL_TABLET | Freq: Every day | ORAL | 3 refills | Status: AC
  Filled 2024-07-14: qty 90, 90d supply, fill #0

## 2024-07-14 NOTE — Progress Notes (Signed)
 "  Established Patient Office Visit   Subjective  Patient ID: Dawn Carlson, female    DOB: January 16, 1951  Age: 74 y.o. MRN: [id number]  Chief Complaint  Patient presents with   New Patient (Initial Visit)    Patient is a 74 year old female seen by Raymond D Cuba, MD who presents as TOC and follow-up on chronic conditions.  States her BP is always elevated.  At home bp typically 137/80.  Taking Norvasc  2.5 mg daily.  May drink 1-2 16.9 ounce bottles of water per day.  Does not eat fast food, frozen meals, typically stays away from processed foods.  Does not add salt to foods when cooking.  Patient notes history of CVA May 2025.  Currently on Crestor  40 mg daily.  Denies myalgias/arthralgias.  Taking Nexium  for globus sensation.  Followed by GI.  Using 2 different eyedrops for h/o glaucoma.  Had eye exam a few months ago.  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  Hasn't been able to go out walking due to the weather.  Pt denies tobacco, alcohol, drug use.  NKDA    Patient Active Problem List   Diagnosis Date Noted   Allergic reaction 04/23/2024   Urticaria 04/23/2024   Pyrosis 03/23/2024   Globus sensation 03/23/2024   Anorexia 03/23/2024   Regurgitation of food 03/23/2024   History of colonic polyps 03/23/2024   Difficulty swallowing 12/17/2023   Primary hypertension 11/19/2023   Acute CVA (cerebrovascular accident) (HCC) 11/11/2023   Cerebellar stroke, acute (HCC) 11/11/2023   Hyperlipidemia 11/11/2023   Wellness examination 04/16/2022   Tick bite of abdominal wall 03/28/2022   Right knee pain 11/14/2021   Class 2 severe obesity due to excess calories with serious comorbidity and body mass index (BMI) of 37.0 to 37.9 in adult 05/08/2021   Blood pressure elevated without history of HTN 05/08/2021   Pre-diabetes 05/08/2021   Insulin  resistance 05/08/2021   Acanthosis nigricans 05/08/2021   At high risk for cardiovascular disease 05/08/2021   Encounter for medical  examination to establish care 03/12/2021   Leg heaviness 03/12/2021   Familial hyperlipidemia, 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   Hypothyroidism 04/07/2006   CARPAL TUNNEL RELEASE, HX OF 04/07/2006   Past Medical History:  Diagnosis Date   Glaucoma 2024   Eye Right   Stroke [facility])    Thyroid  disease    Past Surgical History:  Procedure Laterality Date   ABDOMINAL HYSTERECTOMY  approximately 35 years ago   CARPAL TUNNEL RELEASE     Social History[1] Family History  Problem Relation Age of Onset   Hypertension Mother    Heart disease Mother    Diabetes Mother    ADD / ADHD Mother    Arthritis Mother    Vision loss Mother    ADD / ADHD Father    Hearing loss Father    Diabetes Sister    ADD / ADHD Sister    Diabetes Sister    ADD / ADHD Sister    Arthritis Sister    COPD Sister    Diabetes Sister    Heart disease Sister    Kidney disease Sister    Diabetes Sister    Diabetes Brother    ADD / ADHD Brother    COPD Brother    ADD / ADHD Brother    COPD Brother    Diabetes Brother    Hearing loss Brother    Vision loss Brother    ADD / ADHD Brother    Diabetes Brother  Hearing loss Brother    Vision loss Brother    Schizophrenia Grandson    Diabetes Other    Breast cancer Neg Hx    Colon cancer Neg Hx    Esophageal cancer Neg Hx    Inflammatory bowel disease Neg Hx    Liver disease Neg Hx    Pancreatic cancer Neg Hx    Rectal cancer Neg Hx    Stomach cancer Neg Hx    Allergies[2]  ROS Negative unless stated above    Objective:     BP (!) 140/94 (BP Location: Left Arm, Patient Position: Sitting, Cuff Size: Large)   Pulse 75   Temp 98.5 F (36.9 C) (Oral)   Ht 5' 6 (1.676 m)   Wt 241 lb 9.6 oz (109.6 kg)   SpO2 99%   BMI 39.00 kg/m  BP Readings from Last 3 Encounters:  07/14/24 (!) 140/94  06/07/24 (!) 142/79  05/04/24 104/70   Wt Readings from Last 3 Encounters:  07/14/24 241 lb 9.6 oz (109.6 kg)  06/07/24 244 lb 6.4 oz (110.9 kg)  05/04/24 242 lb  9.6 oz (110 kg)      Physical Exam Constitutional:      General: She is not in acute distress.    Appearance: Normal appearance.  HENT:     Head: Normocephalic and atraumatic.     Nose: Nose normal.     Mouth/Throat:     Mouth: Mucous membranes are moist.  Cardiovascular:     Rate and Rhythm: Normal rate and regular rhythm.     Heart sounds: Normal heart sounds. No murmur heard.    No gallop.  Pulmonary:     Effort: Pulmonary effort is normal. No respiratory distress.     Breath sounds: Normal breath sounds. No wheezing, rhonchi or rales.  Skin:    General: Skin is warm and dry.  Neurological:     Mental Status: She is alert and oriented to person, place, and time.        07/14/2024   11:02 AM 03/17/2024   11:11 AM 01/06/2024    2:30 PM  Depression screen PHQ 2/9  Decreased Interest 0 0 0  Down, Depressed, Hopeless 0 0 0  PHQ - 2 Score 0 0 0  Altered sleeping 0 0 0  Tired, decreased energy 0 0 0  Change in appetite 0 0 0  Feeling bad or failure about yourself  0 0 0  Trouble concentrating 0 0 0  Moving slowly or fidgety/restless 0 0 0  Suicidal thoughts 0 0 0  PHQ-9 Score 0 0  0   Difficult doing work/chores Not difficult at all Not difficult at all Not difficult at all     Data saved with a previous flowsheet row definition      07/14/2024   11:03 AM 03/17/2024   11:11 AM 12/17/2023    1:05 PM 11/19/2023   10:42 AM  GAD 7 : Generalized Anxiety Score  Nervous, Anxious, on Edge 0 0  0  0   Control/stop worrying 0 0  0  0   Worry too much - different things 0 0  0  0   Trouble relaxing 0 0  0  0   Restless 0 0  0  0   Easily annoyed or irritable 0 0  0  0   Afraid - awful might happen 0 0  0  0   Total GAD 7 Score 0 0 0 0  Anxiety  Difficulty Not difficult at all Not difficult at all Not difficult at all Not difficult at all     Data saved with a previous flowsheet row definition     No results found for any visits on 07/14/24.    Assessment & Plan:    Essential hypertension -     amLODIPine  Besylate; Take 1 tablet (5 mg total) by mouth daily.  Dispense: 90 tablet; Refill: 3  Globus sensation  Mixed hyperlipidemia  History of CVA (cerebrovascular accident)  BP elevated in clinic.  Higher on recheck by this provider.  Per chart review previously elevated.  Discussed increasing Norvasc  from 2.5 mg to 5 mg daily.  Patient initially hesitant.  Discussed the importance of BP control and HLD to prevent future CVAs.  Rx sent to pharmacy.  Advised to continue monitoring BP at home and keep a log to bring with her to clinic in 4-6 weeks.  Continue lifestyle modifications.  Continue Nexium  for history of globus sensation.  Continue follow-up with GI.  Continue Crestor  40 mg daily.  Recheck cholesterol when fasting at next OFV.   Immunizations reviewed.  Patient advised to consider influenza vaccine and shingles vaccines.  Can obtain at local pharmacy.  Return in about 5 weeks (around 08/18/2024) for blood pressure.   Clotilda JONELLE Single, MD     [1]  Social History Tobacco Use   Smoking status: Never    Passive exposure: Never   Smokeless tobacco: Never  Vaping Use   Vaping status: Never Used  Substance Use Topics   Alcohol use: No   Drug use: No  [2] No Known Allergies  "

## 2024-07-14 NOTE — Patient Instructions (Signed)
 It was a nice meeting you today.  We will have you follow-up in the next 4-6 weeks to see how you are doing with the increased dose of blood pressure and medication.  The new prescription for Norvasc  (amlodipine ) 5 mg was sent to your pharmacy.  Monitor your blood pressure at home and keep a log of the readings to bring with you to your next appointment as well as your blood pressure cuff.  If your blood pressure is consistently greater than 140/90 at home notify the clinic.

## 2024-07-15 ENCOUNTER — Other Ambulatory Visit (HOSPITAL_BASED_OUTPATIENT_CLINIC_OR_DEPARTMENT_OTHER): Payer: Self-pay

## 2024-07-23 ENCOUNTER — Other Ambulatory Visit (HOSPITAL_COMMUNITY): Payer: Self-pay

## 2024-08-18 ENCOUNTER — Ambulatory Visit: Admitting: Family Medicine

## 2025-06-07 ENCOUNTER — Ambulatory Visit: Admitting: Adult Health
